# Patient Record
Sex: Female | Born: 1960 | Race: Black or African American | Hispanic: No | Marital: Single | Smoking: Current every day smoker
Health system: Southern US, Community
[De-identification: ages and names within clinical notes are randomized; demographics above are authoritative.]

## PROBLEM LIST (undated history)

## (undated) DIAGNOSIS — M5430 Sciatica, unspecified side: Secondary | ICD-10-CM

## (undated) DIAGNOSIS — R0600 Dyspnea, unspecified: Secondary | ICD-10-CM

## (undated) DIAGNOSIS — J45909 Unspecified asthma, uncomplicated: Secondary | ICD-10-CM

## (undated) DIAGNOSIS — D649 Anemia, unspecified: Secondary | ICD-10-CM

## (undated) DIAGNOSIS — R519 Headache, unspecified: Secondary | ICD-10-CM

## (undated) DIAGNOSIS — E785 Hyperlipidemia, unspecified: Secondary | ICD-10-CM

## (undated) DIAGNOSIS — K219 Gastro-esophageal reflux disease without esophagitis: Secondary | ICD-10-CM

## (undated) DIAGNOSIS — F319 Bipolar disorder, unspecified: Secondary | ICD-10-CM

## (undated) DIAGNOSIS — M5137 Other intervertebral disc degeneration, lumbosacral region: Secondary | ICD-10-CM

## (undated) DIAGNOSIS — I639 Cerebral infarction, unspecified: Secondary | ICD-10-CM

## (undated) DIAGNOSIS — J42 Unspecified chronic bronchitis: Secondary | ICD-10-CM

## (undated) DIAGNOSIS — I10 Essential (primary) hypertension: Secondary | ICD-10-CM

## (undated) DIAGNOSIS — M1990 Unspecified osteoarthritis, unspecified site: Secondary | ICD-10-CM

## (undated) DIAGNOSIS — F419 Anxiety disorder, unspecified: Secondary | ICD-10-CM

## (undated) DIAGNOSIS — F32A Depression, unspecified: Secondary | ICD-10-CM

## (undated) DIAGNOSIS — J189 Pneumonia, unspecified organism: Secondary | ICD-10-CM

## (undated) HISTORY — PX: HERNIA REPAIR: SHX51

---

## 2003-06-28 ENCOUNTER — Ambulatory Visit (HOSPITAL_COMMUNITY): Admission: RE | Admit: 2003-06-28 | Discharge: 2003-06-28 | Payer: Self-pay | Admitting: Obstetrics & Gynecology

## 2003-06-28 IMAGING — US US PELVIS COMPLETE MODIFY
1 series · 14 of 25 positions shown · non-contrast
Comparison: none

CLINICAL DATA: Family history of fibroids with heavy bleeding.
 TRANSABDOMINAL AND ENDOVAGINAL PELVIC ULTRASOUND:
 Multiple images of the uterus and adnexa were obtained using a transabdominal and endovaginal approaches. 
 The uterus has a maximal sagittal length of 10.3 cm and maximal AP width of 5.0 cm.  The uterine myometrium appears homogeneous with no areas of focal inhomogeniety to suggest the presence of focal fibroids.  
 The endometrial canal appears homogeneously echogenic with a maximal AP width of 8.0 mm and this would correlate with the patient?s presecretory phase of the cycle.  
 The right ovary was best seen transabdominally measuring 2.9 x 1.4 x 2.6 cm.  The left ovary was visualized measuring 2.8 x 1.9 x 1.8 cm and has a normal ultrasound appearance.  No cul-de-sac or periovarian fluid is seen and no separate adnexal masses are noted.
 IMPRESSION
 Normal pelvic ultrasound.

[Series 1: unknown · 0.38mm/px · 14 of 29 slices shown]
[im 1/29]
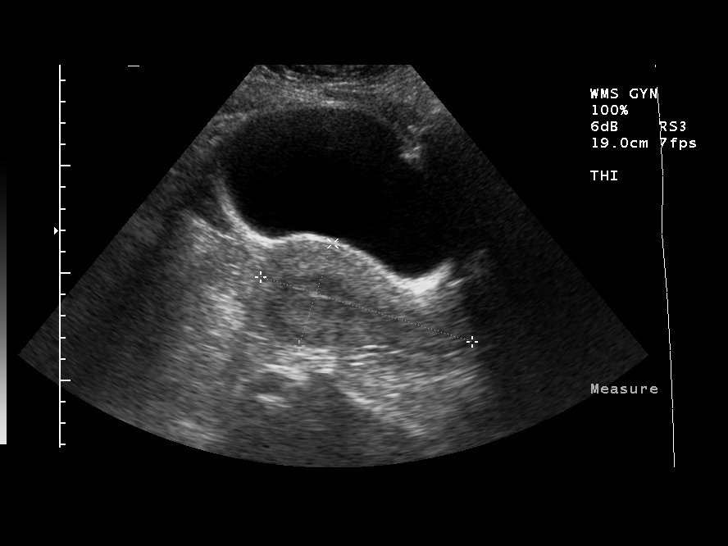
[im 3/29]
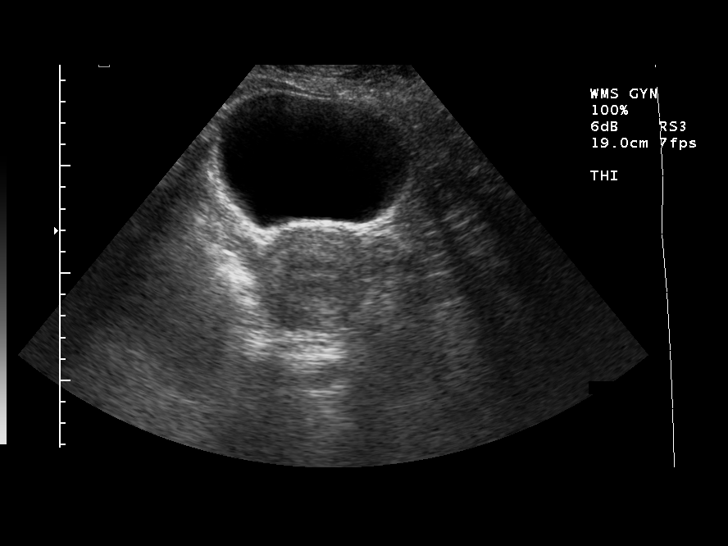
[im 5/29]
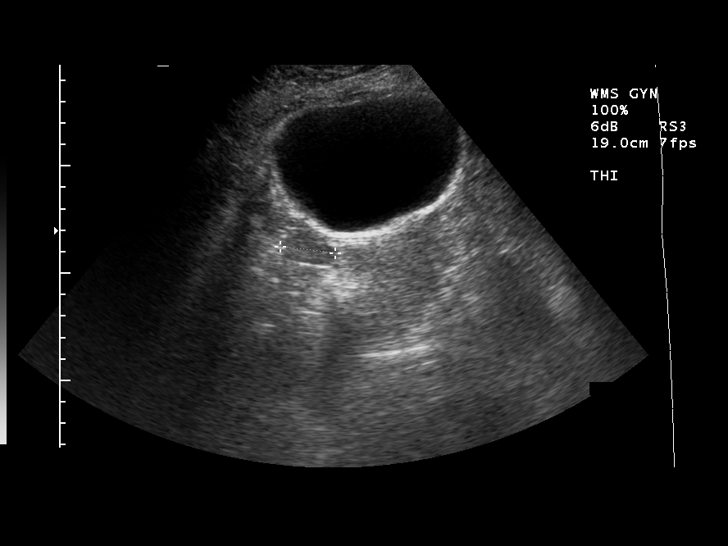
[im 8/29]
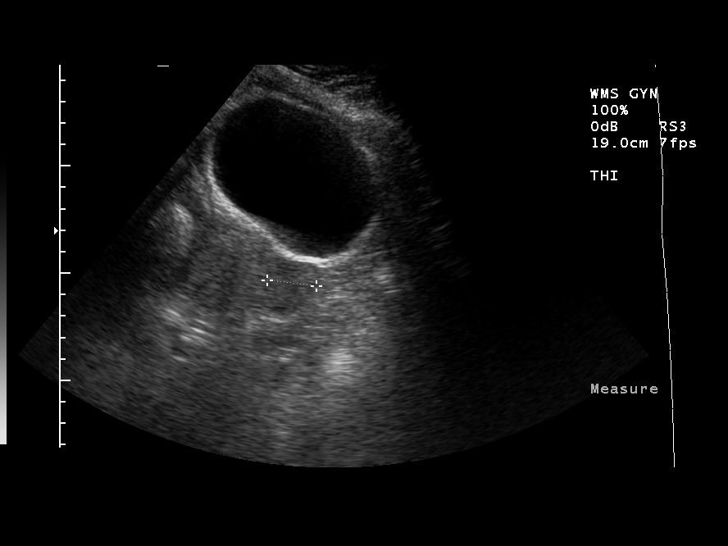
[im 10/29]
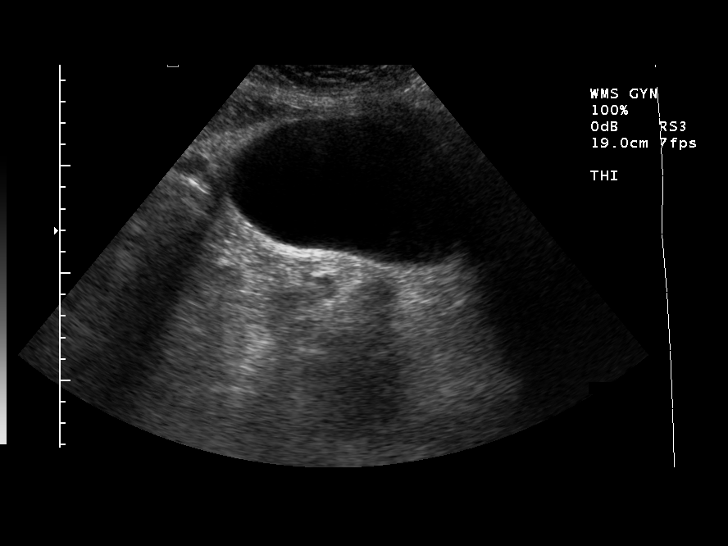
[im 11/29]
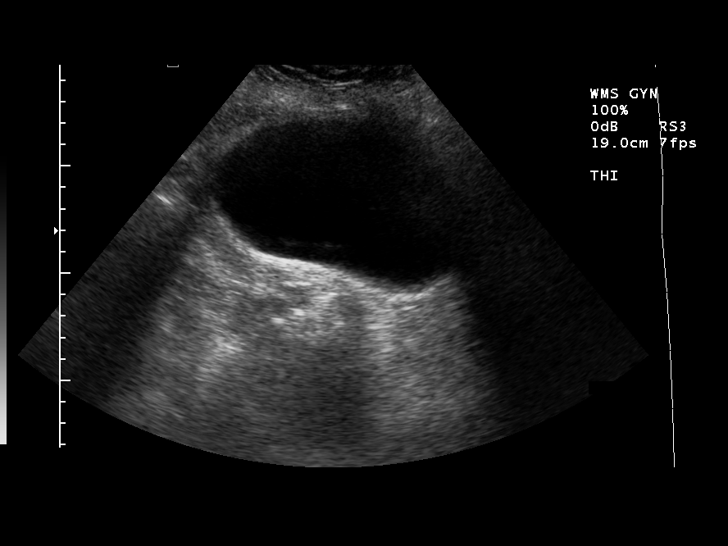
[im 13/29]
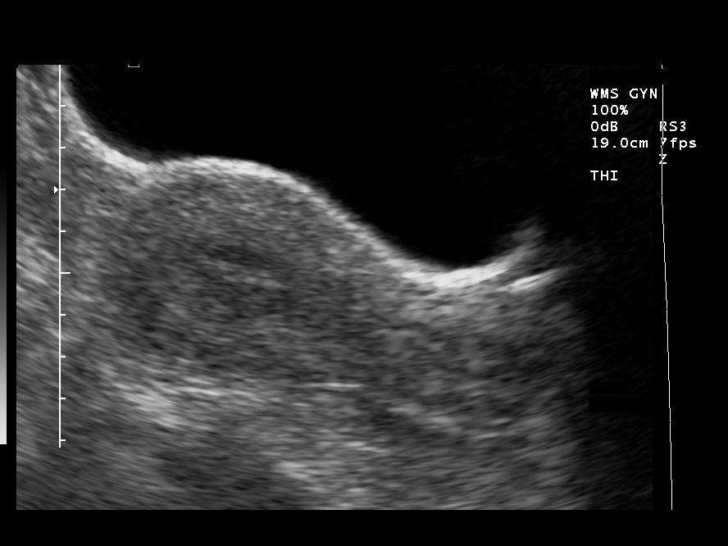
[im 16/29]
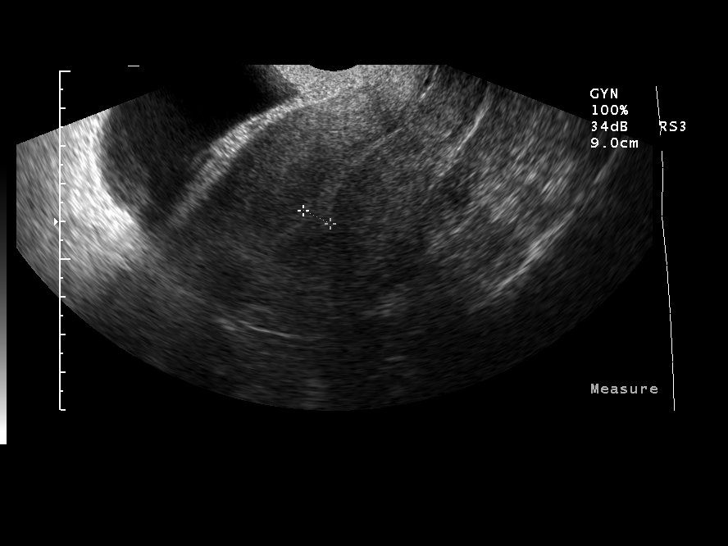
[im 18/29]
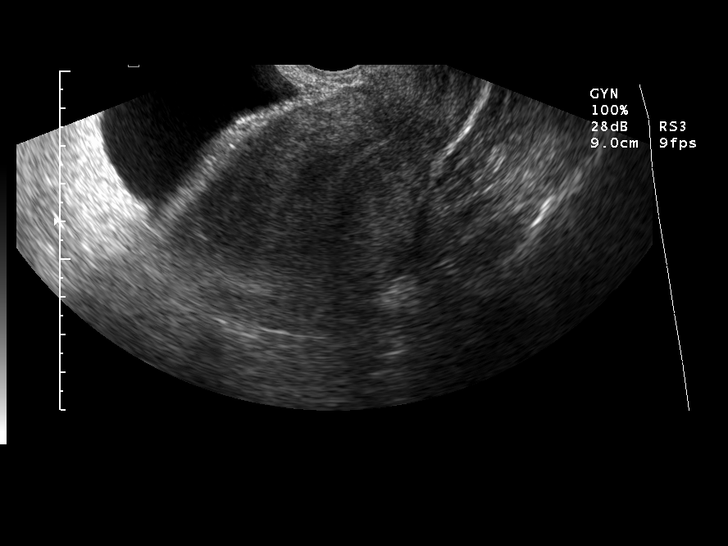
[im 19/29]
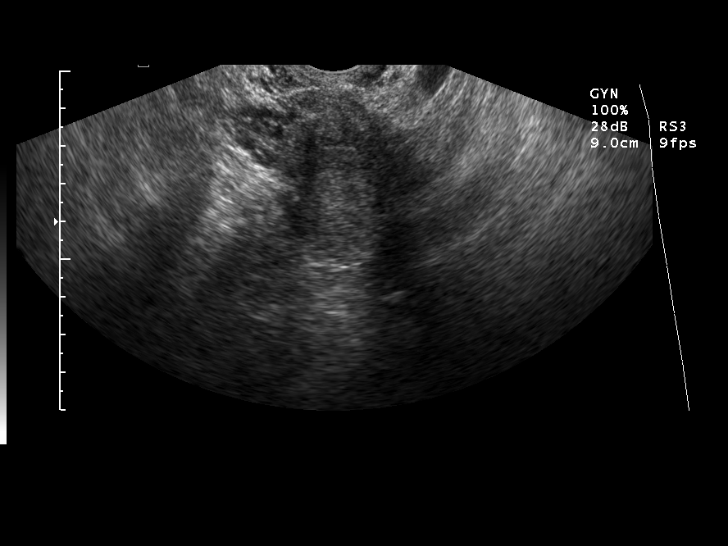
[im 22/29]
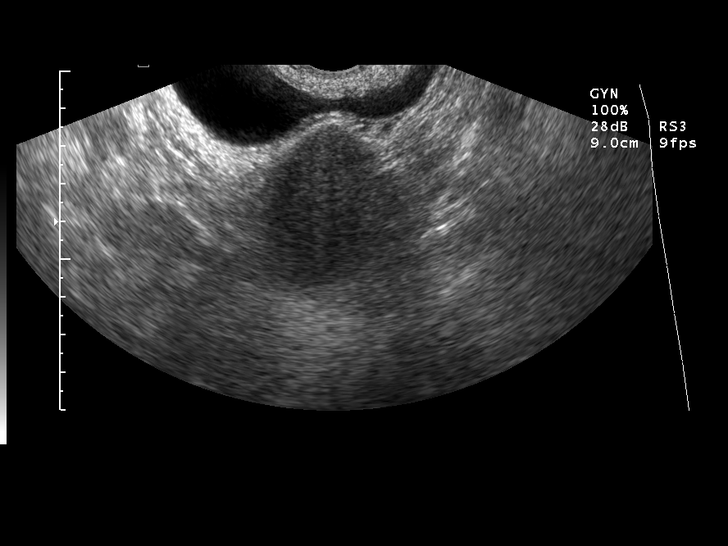
[im 24/29]
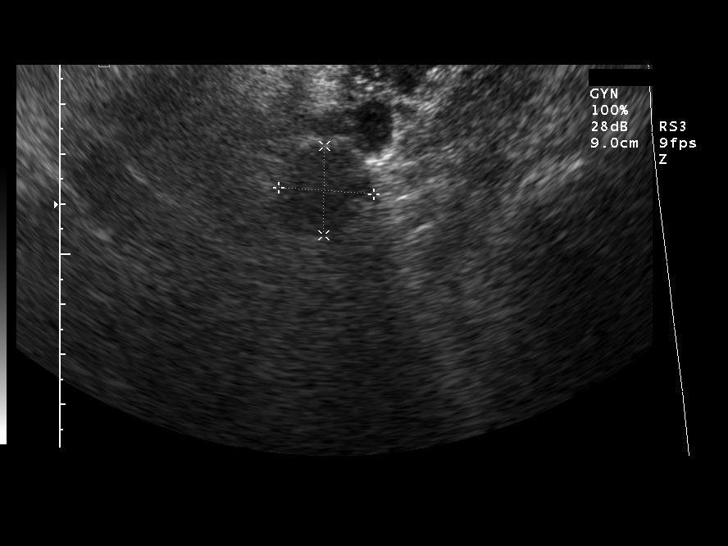
[im 26/29]
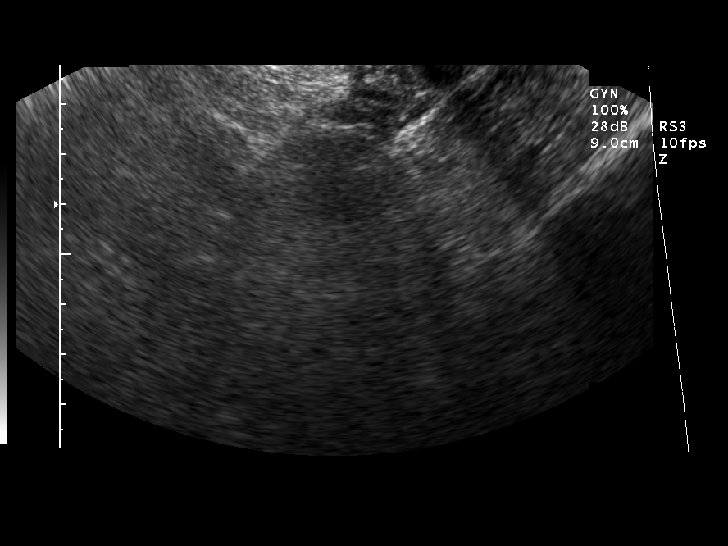
[im 29/29]
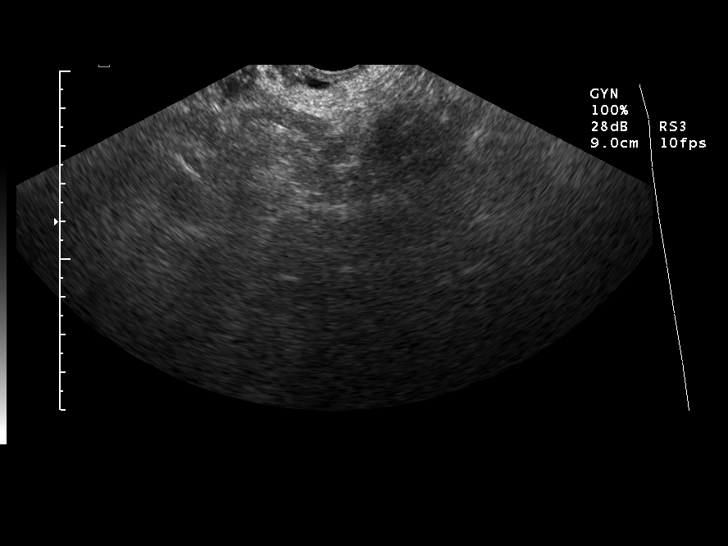

[14 of 25 positions shown; findings below may reference images not displayed]

## 2003-10-26 ENCOUNTER — Encounter (INDEPENDENT_AMBULATORY_CARE_PROVIDER_SITE_OTHER): Payer: Self-pay | Admitting: Cardiology

## 2003-10-26 ENCOUNTER — Ambulatory Visit: Admission: RE | Admit: 2003-10-26 | Discharge: 2003-10-26 | Payer: Self-pay | Admitting: Internal Medicine

## 2004-04-10 ENCOUNTER — Emergency Department (HOSPITAL_COMMUNITY): Admission: EM | Admit: 2004-04-10 | Discharge: 2004-04-10 | Payer: Self-pay | Admitting: Emergency Medicine

## 2004-04-10 IMAGING — CR DG KNEE COMPLETE 4+V*L*
4 series · 4 of 4 positions shown · non-contrast
Comparison: none

CLINICAL DATA: Knee pain.  No injury.
 LEFT KNEE, COMPLETE:
 Four views of the left knee were obtained.  No acute fracture is seen.  The joint spaces are normal.  There may be a small knee joint effusion present.

[view not recorded (1 of 4)]
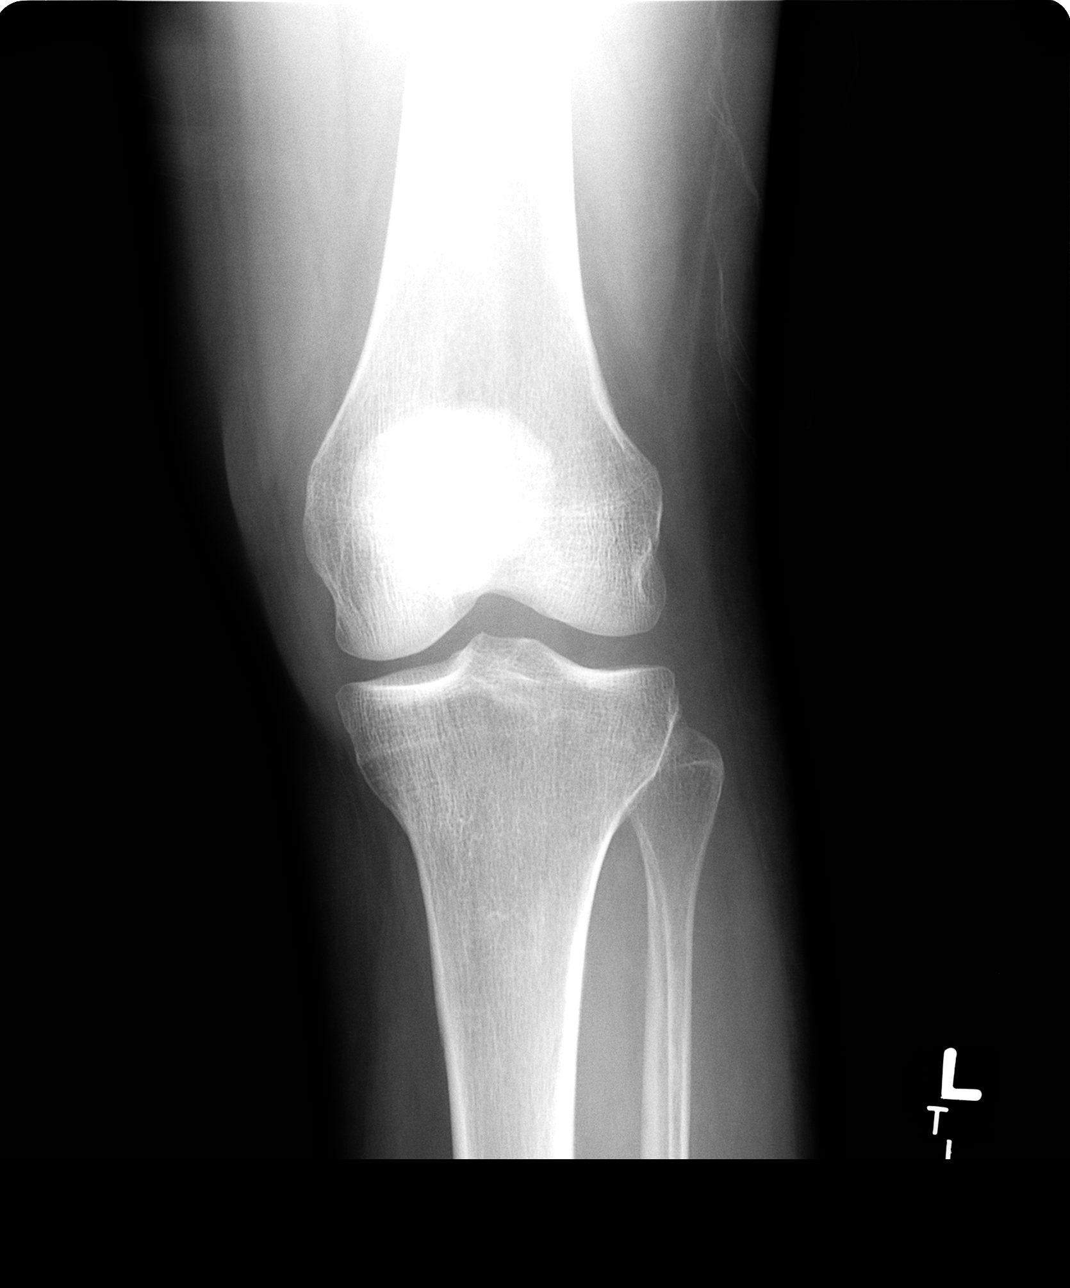

[view not recorded (2 of 4)]
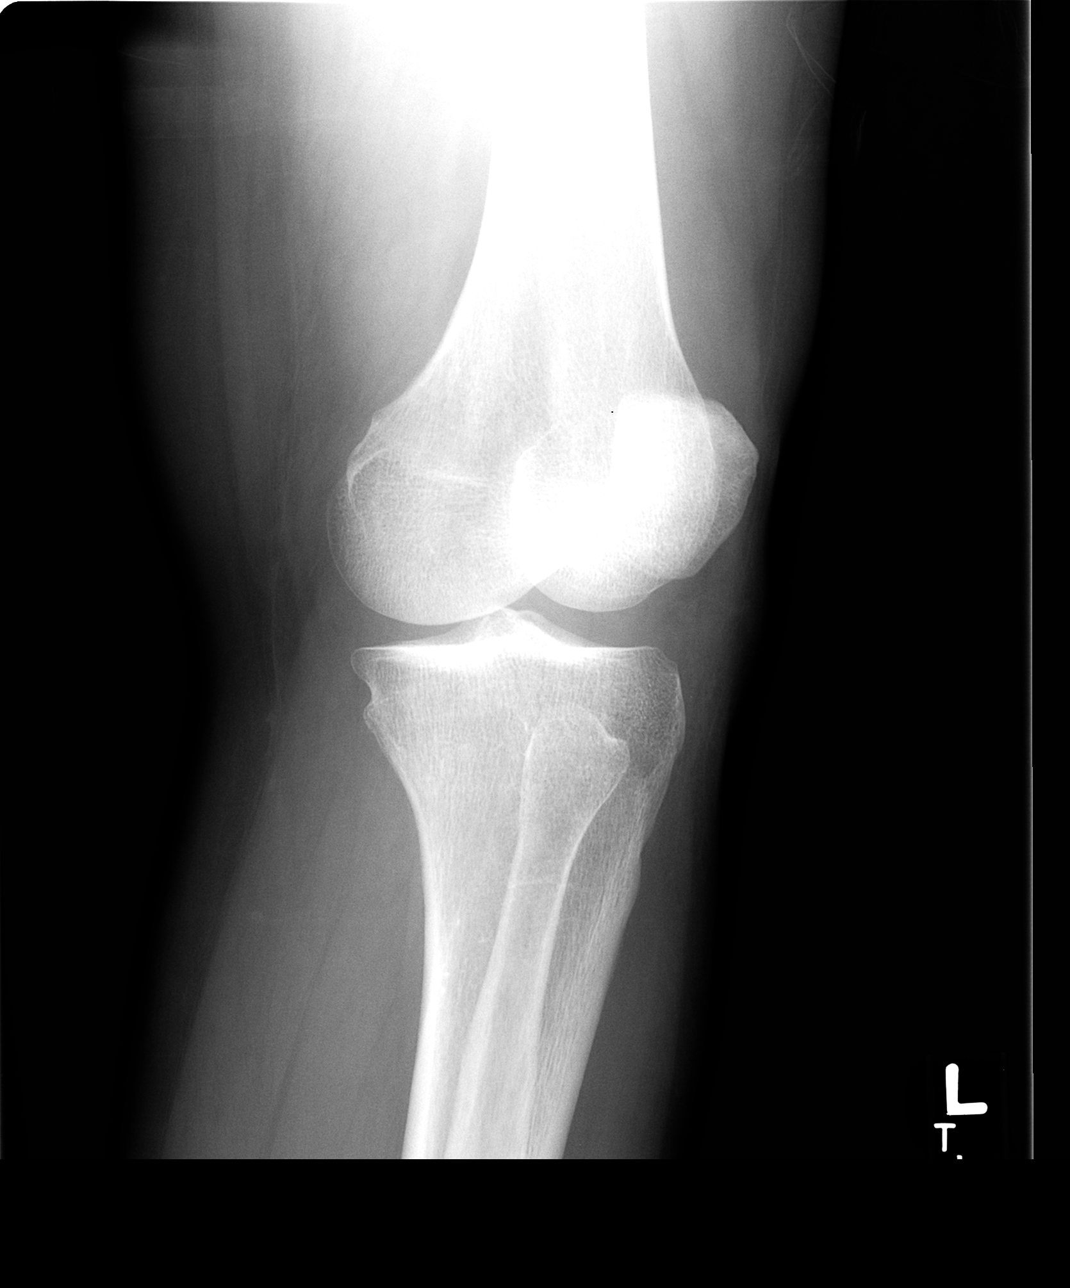

[view not recorded (3 of 4)]
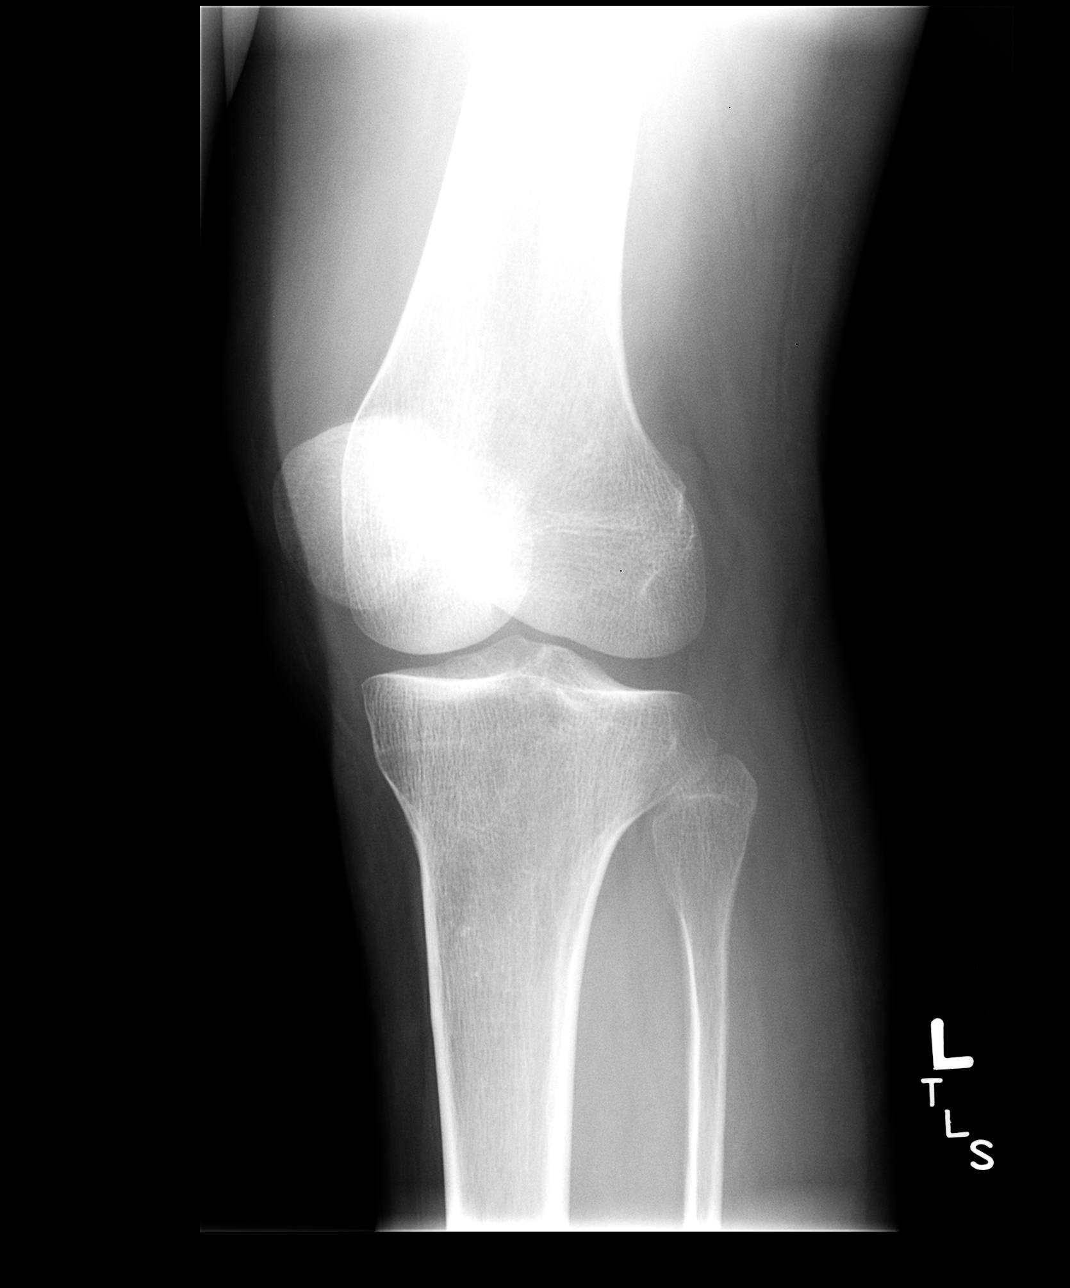

[view not recorded (4 of 4)]
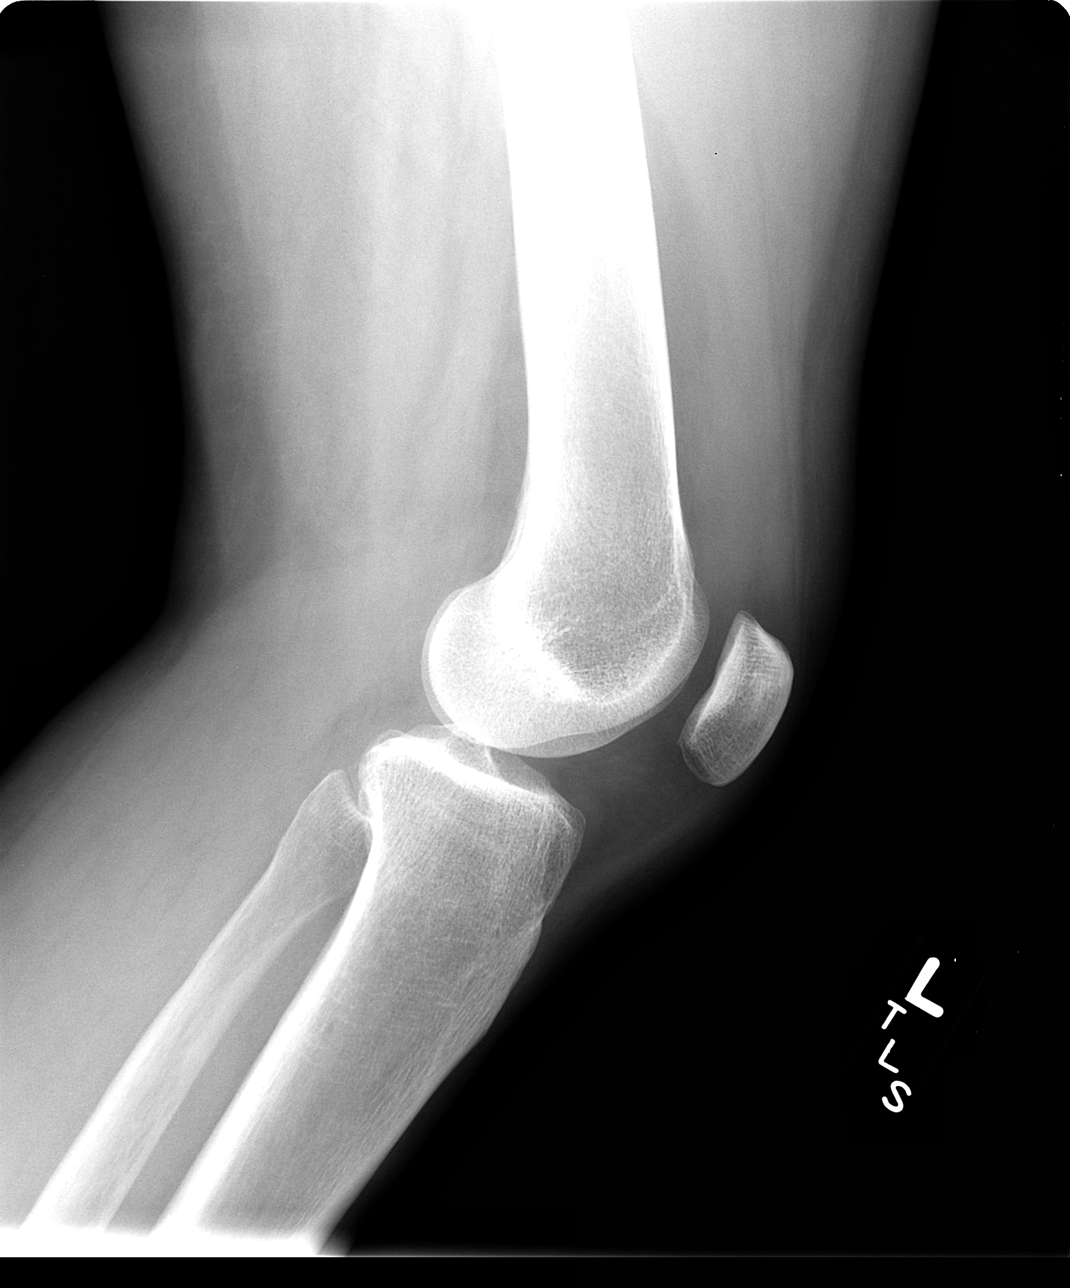

[4 of 4 positions shown; findings below may reference images not displayed]

IMPRESSION: No acute abnormality.  Question small effusion.

## 2009-10-19 ENCOUNTER — Ambulatory Visit (HOSPITAL_COMMUNITY): Admission: RE | Admit: 2009-10-19 | Discharge: 2009-10-19 | Payer: Self-pay | Admitting: Internal Medicine

## 2009-10-19 IMAGING — MG MM DIGITAL SCREENING
4 series · 4 of 4 positions shown · non-contrast
Comparison: Prior studies.

DG SCREEN MAMMOGRAM BILATERAL
Bilateral CC and MLO view(s) were taken.

DIGITAL SCREENING MAMMOGRAM WITH CAD:

[R CC]
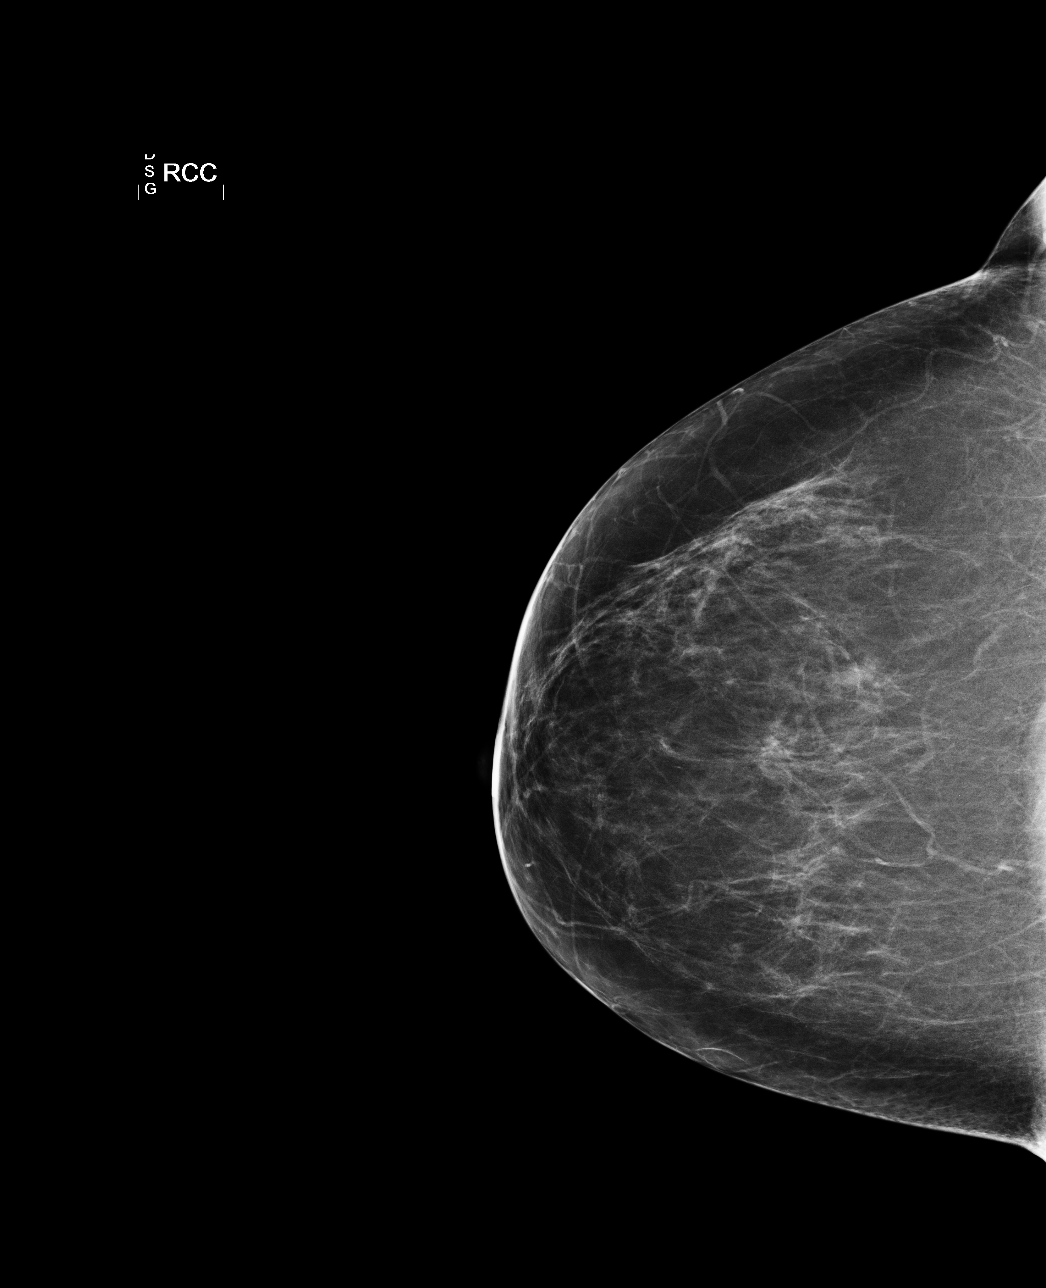

[R MLO]
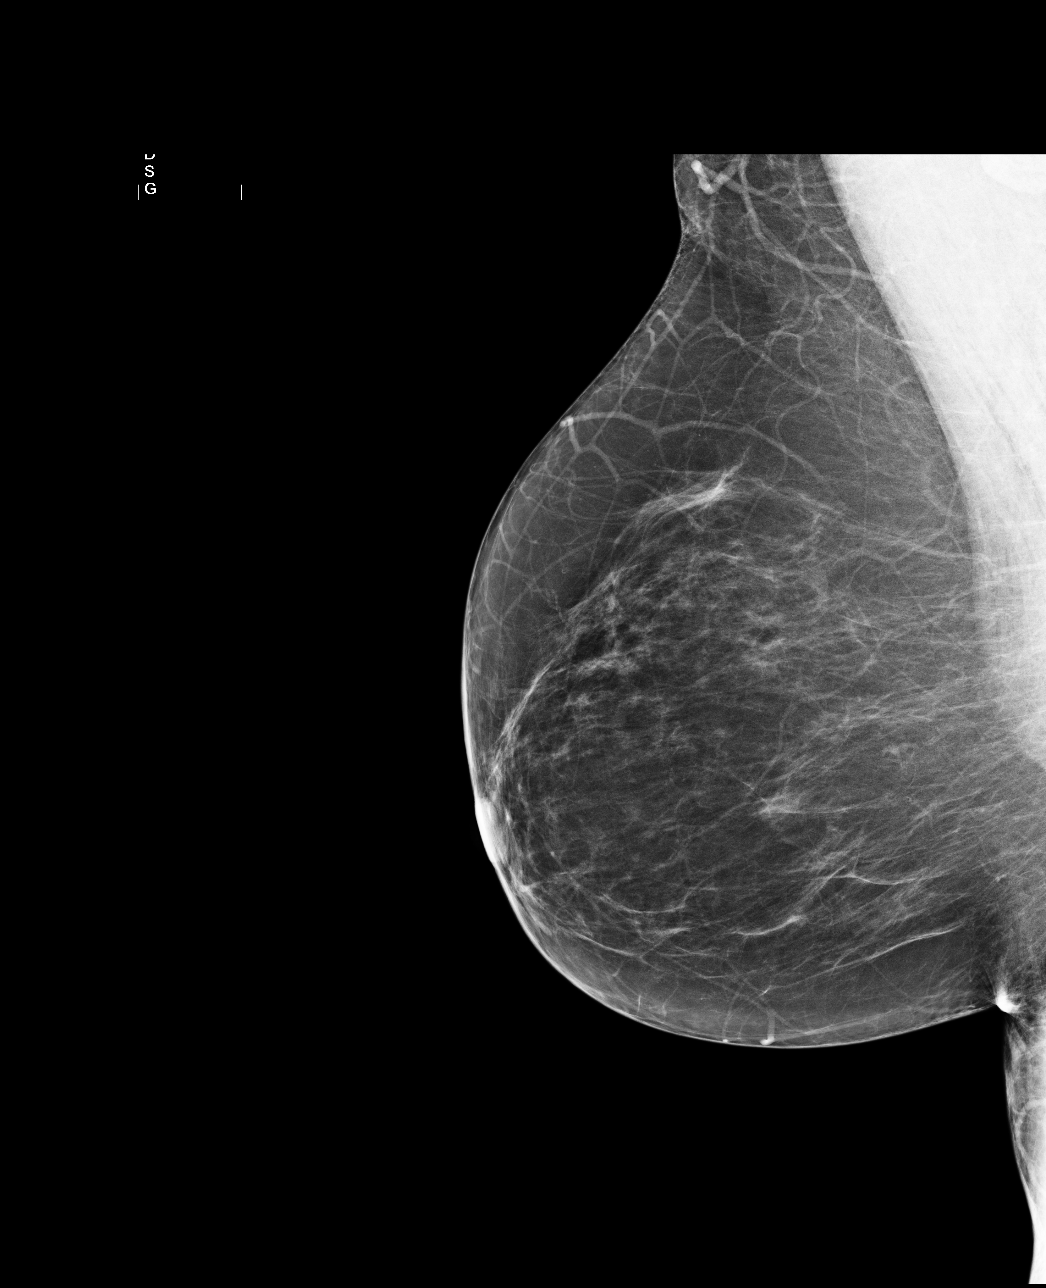

[L CC]
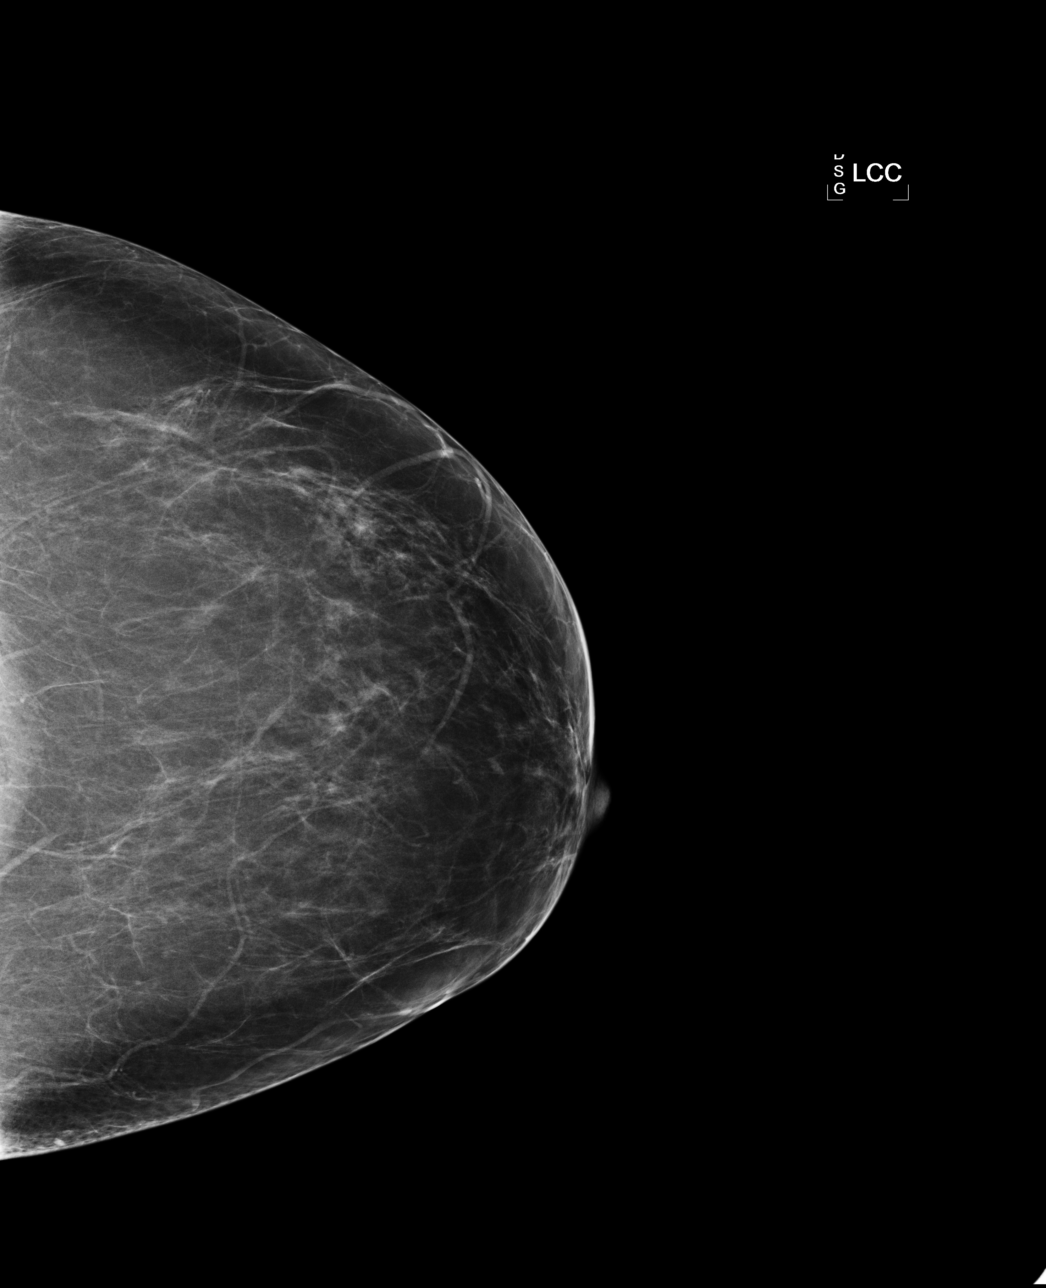

[L MLO]
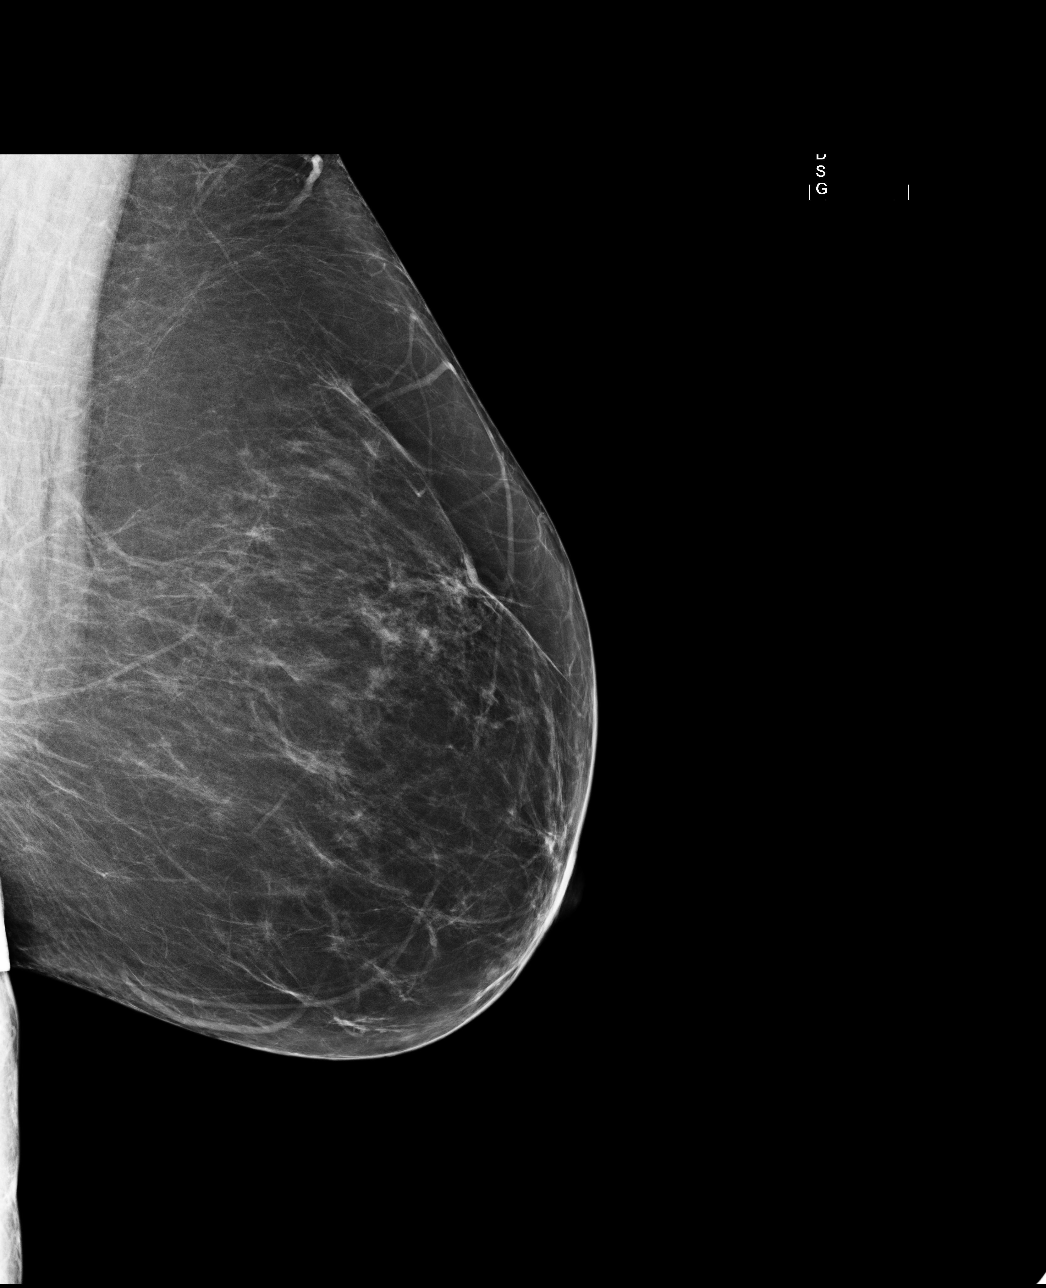

[4 of 4 positions shown; findings below may reference images not displayed]

The breast tissue is almost entirely fatty.  There is no dominant mass, architectural distortion or
calcification to suggest malignancy.

Images were processed with CAD.
IMPRESSION: No mammographic evidence of malignancy.  Suggest yearly screening mammography.

A result letter of this screening mammogram will be mailed directly to the patient.

ASSESSMENT: Negative - BI-RADS 1

Screening mammogram in 1 year.
,

## 2010-05-07 ENCOUNTER — Encounter: Payer: Self-pay | Admitting: Internal Medicine

## 2013-04-23 ENCOUNTER — Emergency Department (HOSPITAL_COMMUNITY)
Admission: EM | Admit: 2013-04-23 | Discharge: 2013-04-23 | Disposition: A | Discharge status: Home / Self Care | Payer: Self-pay | Source: Home / Self Care

## 2013-04-23 NOTE — ED Notes (Signed)
Pt called in lobby x 1. KDL EMT-P 

## 2015-09-25 ENCOUNTER — Encounter (HOSPITAL_COMMUNITY): Payer: Self-pay | Admitting: Emergency Medicine

## 2015-09-25 ENCOUNTER — Emergency Department (HOSPITAL_COMMUNITY): Payer: Medicaid Other

## 2015-09-25 ENCOUNTER — Emergency Department (HOSPITAL_COMMUNITY)
Admission: EM | Admit: 2015-09-25 | Discharge: 2015-09-25 | Disposition: A | Discharge status: Home / Self Care | Payer: Medicaid Other | Attending: Emergency Medicine | Admitting: Emergency Medicine

## 2015-09-25 DIAGNOSIS — Y92000 Kitchen of unspecified non-institutional (private) residence as  the place of occurrence of the external cause: Secondary | ICD-10-CM | POA: Insufficient documentation

## 2015-09-25 DIAGNOSIS — Y999 Unspecified external cause status: Secondary | ICD-10-CM | POA: Insufficient documentation

## 2015-09-25 DIAGNOSIS — W458XXA Other foreign body or object entering through skin, initial encounter: Secondary | ICD-10-CM | POA: Diagnosis not present

## 2015-09-25 DIAGNOSIS — E785 Hyperlipidemia, unspecified: Secondary | ICD-10-CM | POA: Diagnosis not present

## 2015-09-25 DIAGNOSIS — I10 Essential (primary) hypertension: Secondary | ICD-10-CM | POA: Insufficient documentation

## 2015-09-25 DIAGNOSIS — S99922A Unspecified injury of left foot, initial encounter: Secondary | ICD-10-CM

## 2015-09-25 DIAGNOSIS — S90852A Superficial foreign body, left foot, initial encounter: Secondary | ICD-10-CM | POA: Insufficient documentation

## 2015-09-25 DIAGNOSIS — Y939 Activity, unspecified: Secondary | ICD-10-CM | POA: Insufficient documentation

## 2015-09-25 HISTORY — DX: Anxiety disorder, unspecified: F41.9

## 2015-09-25 HISTORY — DX: Hyperlipidemia, unspecified: E78.5

## 2015-09-25 HISTORY — DX: Unspecified chronic bronchitis: J42

## 2015-09-25 HISTORY — DX: Essential (primary) hypertension: I10

## 2015-09-25 IMAGING — CR DG FOOT COMPLETE 3+V*L*
3 series · 3 of 3 positions shown · non-contrast
Comparison: None.

CLINICAL DATA: Fishing hook in the left foot.

EXAM:
LEFT FOOT - COMPLETE 3+ VIEW

[x foot lat left (1 of 2)]
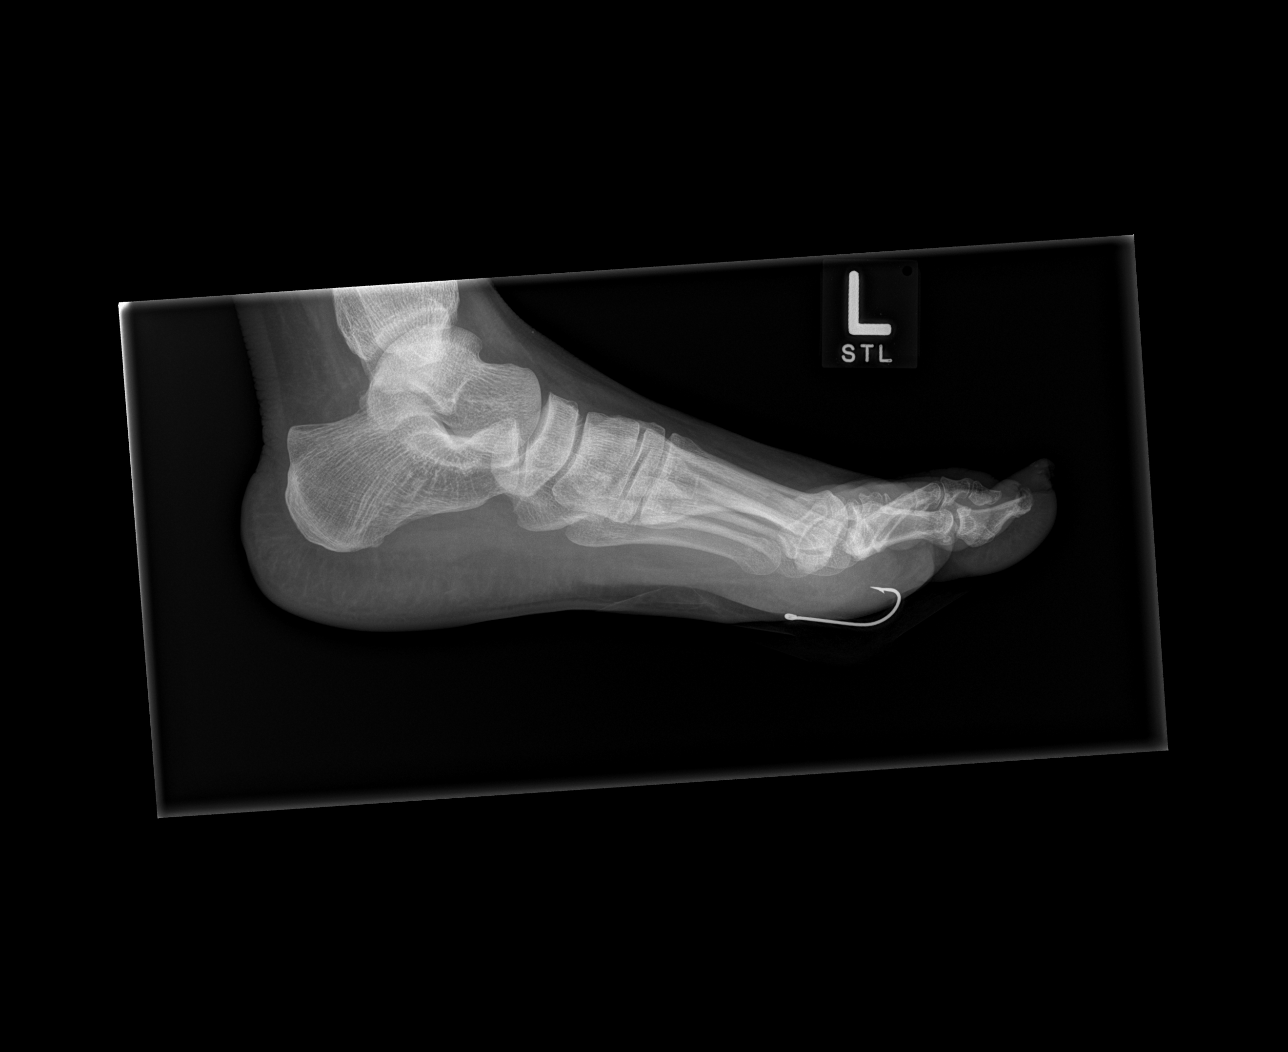

[x foot lat left (2 of 2)]
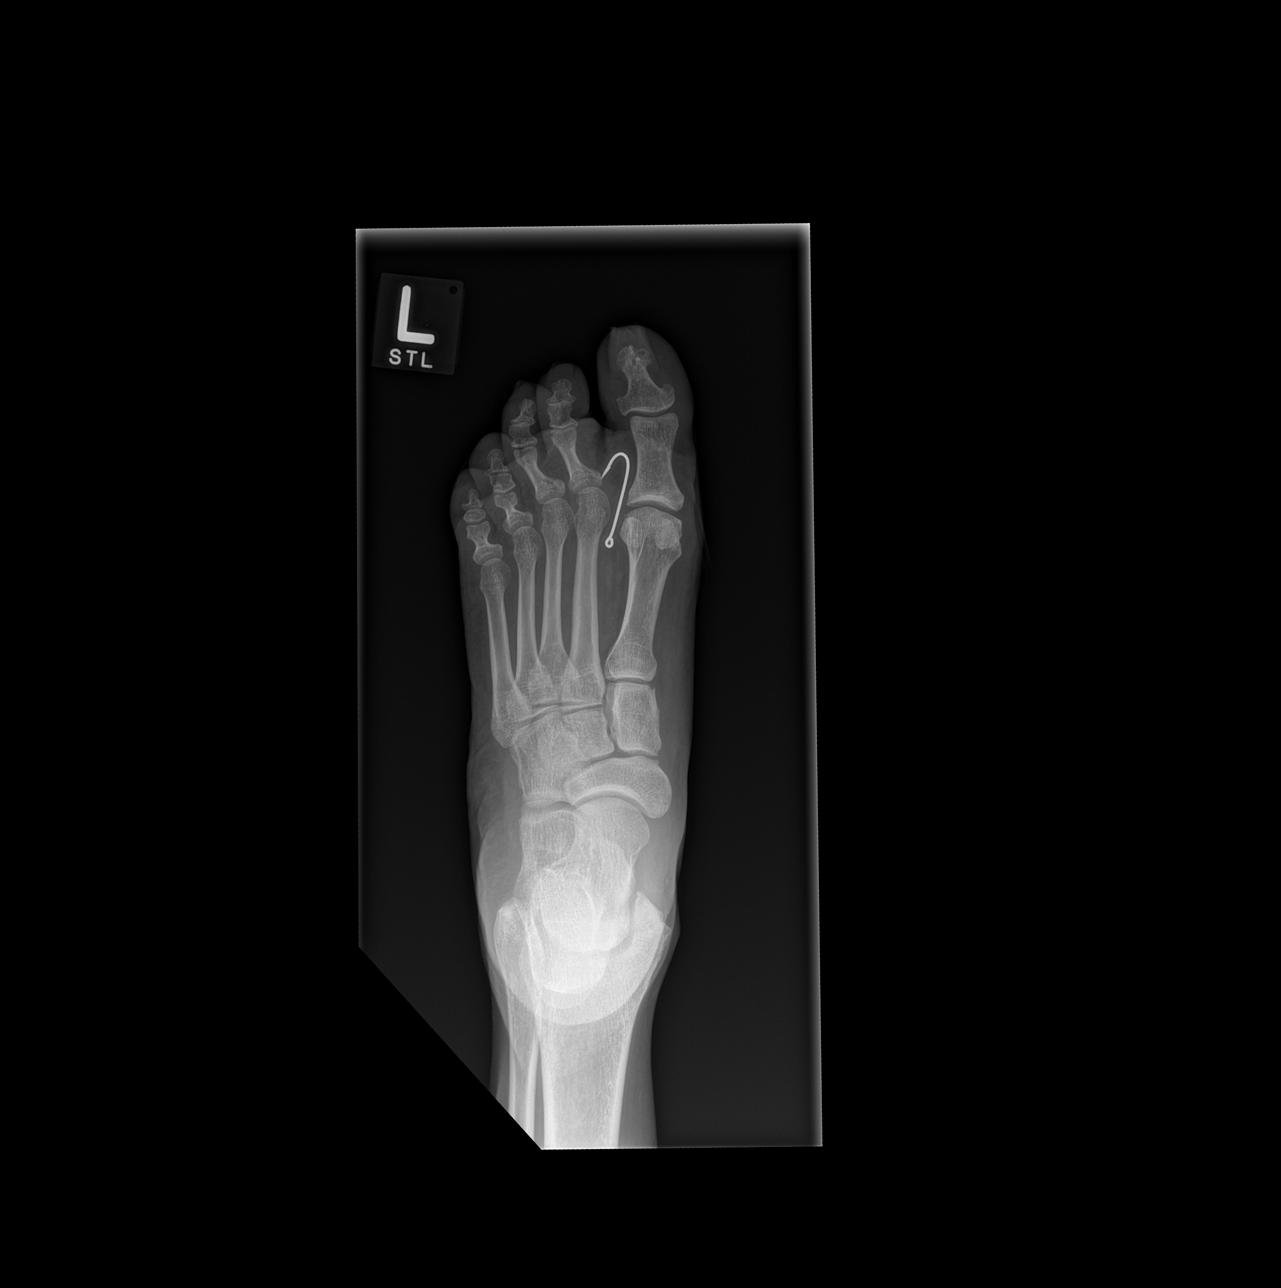

[x foot obl left]
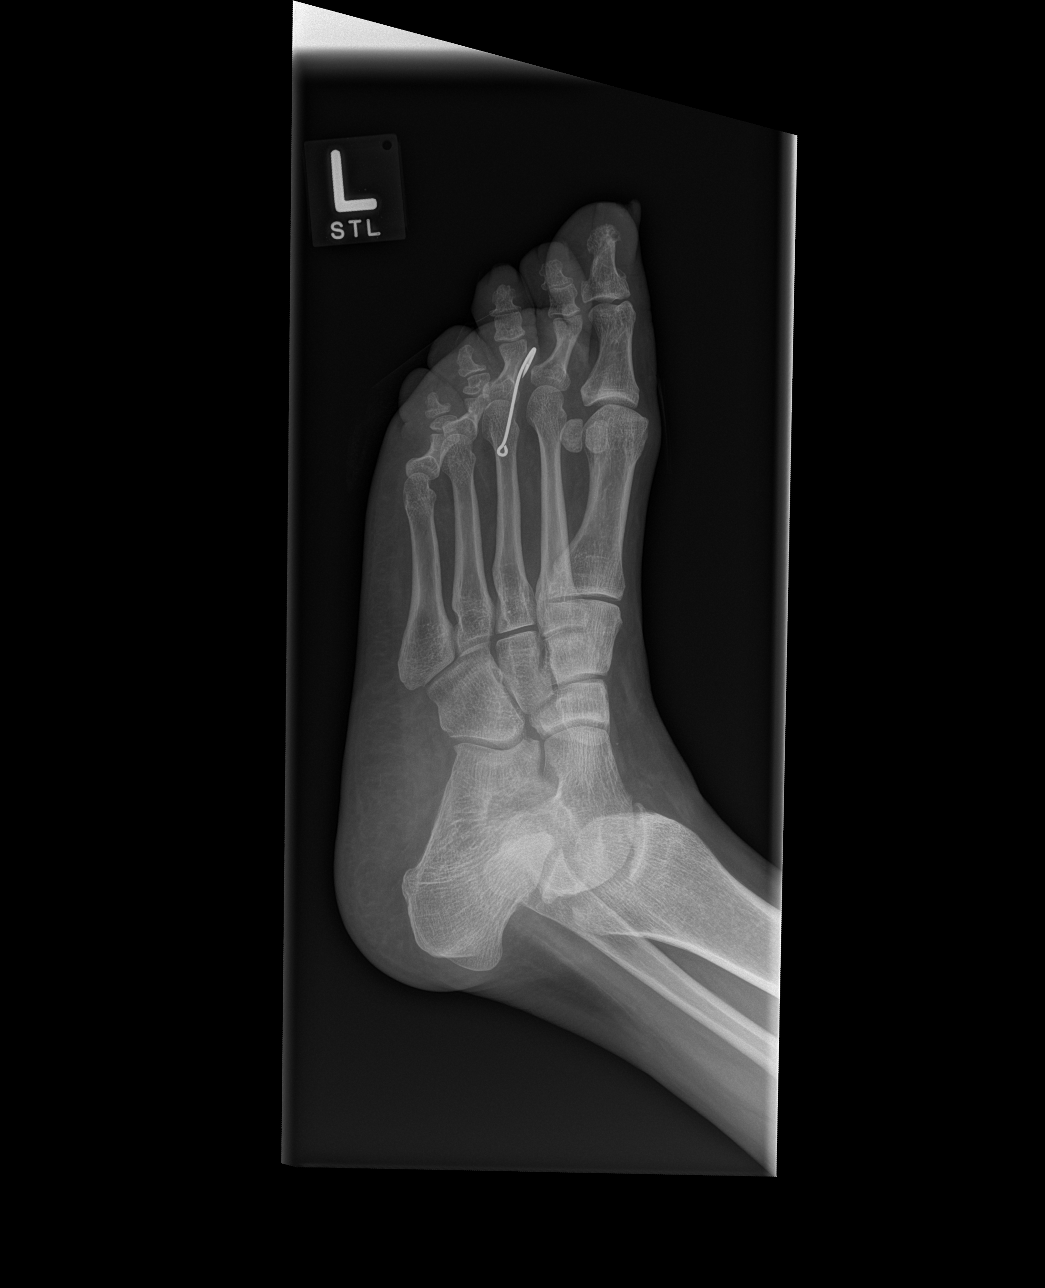

[3 of 3 positions shown; findings below may reference images not displayed]

FINDINGS: No visible fracture of the fishing hook embedded in the soft tissues
of the ball of the patient's foot between the first and second rays.
It appears that about 11 mm of the hook is imbedded into the
tissues. Single retention RTOYOTA on the conventional style fish hook.
No fracture of the hook is identified. The hook is fully imbedded in
the soft tissues and well away from the bones.
IMPRESSION: 1. Conventional and simple style fish hook embedded in the soft
tissues of the ball of the foot, with about 11 mm imbedded in the
soft tissues. The tissue is well away from the bony structures, and
has a single retention RTOYOTA.

## 2015-09-25 MED ORDER — HYDROCODONE-ACETAMINOPHEN 5-325 MG PO TABS
1.0000 | ORAL_TABLET | Freq: Once | ORAL | Status: AC
  Administered 2015-09-25: 1 via ORAL
  Filled 2015-09-25: qty 1

## 2015-09-25 MED ORDER — LIDOCAINE-EPINEPHRINE (PF) 1 %-1:200000 IJ SOLN
10.0000 mL | Freq: Once | INTRAMUSCULAR | Status: DC
  Filled 2015-09-25: qty 30

## 2015-09-25 MED ORDER — NAPROXEN 500 MG PO TABS: 500.0000 mg | ORAL_TABLET | Freq: Two times a day (BID) | ORAL | Status: DC | PRN

## 2015-09-25 MED ORDER — LIDOCAINE-EPINEPHRINE (PF) 1 %-1:200000 IJ SOLN
INTRAMUSCULAR | Status: AC
  Administered 2015-09-25: 30 mL
  Filled 2015-09-25: qty 30

## 2015-09-25 MED ORDER — DOXYCYCLINE HYCLATE 100 MG PO CAPS: 100.0000 mg | ORAL_CAPSULE | Freq: Two times a day (BID) | ORAL | Status: DC

## 2015-09-25 MED ORDER — HYDROCODONE-ACETAMINOPHEN 5-325 MG PO TABS: 1.0000 | ORAL_TABLET | Freq: Four times a day (QID) | ORAL | Status: DC | PRN

## 2015-09-25 MED ORDER — TETANUS-DIPHTH-ACELL PERTUSSIS 5-2.5-18.5 LF-MCG/0.5 IM SUSP
0.5000 mL | Freq: Once | INTRAMUSCULAR | Status: AC
  Administered 2015-09-25: 0.5 mL via INTRAMUSCULAR
  Filled 2015-09-25: qty 0.5

## 2015-09-25 NOTE — ED Notes (Signed)
Patient transported to X-ray 

## 2015-09-25 NOTE — Discharge Instructions (Signed)
Keep wound clean with mild soap and water. Keep area covered with a topical antibiotic ointment and bandage, keep bandage dry, and do not submerge in water for 24 hours. Ice and elevate for additional pain relief and swelling. Alternate between naprosyn and norco for additional pain relief but don't drive or operate machinery while taking norco. TAKE ANTIBIOTIC AS DIRECTED! Follow up with your primary care doctor or the [facility] in approximately 1-2 days for wound recheck. Monitor area for signs of infection to include, but not limited to: increasing pain, spreading redness, drainage/pus, worsening swelling, or fevers. Return to emergency department for emergent changing or worsening symptoms.

## 2015-09-25 NOTE — ED Notes (Signed)
Bed: [id number] Expected date:  Expected time:  Means of arrival:  Comments: 55 yo fishing hook in foot

## 2015-09-25 NOTE — ED Provider Notes (Signed)
CSN: [id number]     Arrival date & time 09/25/15  1857 History  By signing my name below, I, Katrina Schmidt, attest that this documentation has been prepared under the direction and in the presence of [address], [employer]. Electronically Signed: Phillis Schmidt, ED Scribe. 09/25/2015. 7:11 PM.   Chief Complaint  Patient presents with  . Foreign Body in Skin   Patient is a 55 y.o. female presenting with foot injury. The history is provided by the patient. No language interpreter was used.  Foot Injury Location:  Toe Time since incident:  30 minutes Injury: yes   Mechanism of injury comment:  Stepped on metallic object Toe location:  L big toe (below L big toe, on the foot) Pain details:    Quality:  Throbbing   Radiates to:  Does not radiate   Severity:  Mild   Onset quality:  Sudden   Duration: just PTA.   Timing:  Constant   Progression:  Worsening Chronicity:  New Dislocation: no   Foreign body present:  Metal Tetanus status:  Out of date Relieved by:  Ice Exacerbated by: palpation. Ineffective treatments:  None tried Associated symptoms: no decreased ROM, no muscle weakness, no numbness, no swelling and no tingling     HPI Comments: Katrina Schmidt is a 55 y.o. female with a PMHx of HTN, HLD, sciatica, and chronic bronchitis, who presents to the Emergency Department complaining of a foreign body to the left foot near the L great toe onset ~20-30 minutes. Pt reports pain to this area is 8/10, constant, throbbing, nonradiating pain, worse with palpation of the metal object, and improves with ice on the area. Pt states that she stepped on a sharp metal object in the kitchen, which is now stuck in her foot. She believes that it may be a hair needle, but it might be a fishing hook, although she states no one in the home fishes. She states that she is unable to get it out. Bleeding has been controlled.  She denies falling/head inj/LOC, CP, SOB, abd pain, N/V/D/C, hematuria, dysuria,  myalgias, numbness, tingling, weakness, loss of ROM of the digits, or swelling. Pt is not UTD on her Tdap, she thinks the last time she had it was 28yrs ago. She denies allergies to medications. She denies hx of immunocompromising conditions.   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 She endorses that she takes BP meds regularly and took them today. She also denies any lightheadedness, vision changes, headaches, or any other symptoms.     Past Medical History  Diagnosis Date  . Hypertension   . Anxiety   . Chronic bronchitis (HCC)   . Hyperlipidemia    No past surgical history on file. History reviewed. No pertinent family history. Social History  Substance Use Topics  . Smoking status: None  . Smokeless tobacco: None  . Alcohol Use: None   OB History    No data available     Review of Systems  HENT: Negative for facial swelling (no head inj).   Eyes: Negative for visual disturbance.  Respiratory: Negative for shortness of breath.   Cardiovascular: Negative for chest pain.  Gastrointestinal: Negative for nausea, vomiting, abdominal pain, diarrhea and constipation.  Genitourinary: Negative for dysuria and hematuria.  Musculoskeletal: Positive for arthralgias. Negative for myalgias and joint swelling.  Skin: Positive for wound. Negative for rash.  Allergic/Immunologic: Negative for immunocompromised state.  Neurological: Negative  for syncope, weakness, light-headedness, numbness and headaches.  Hematological: Does not bruise/bleed easily.  Psychiatric/Behavioral: Negative for confusion.   10 Systems reviewed and all are negative for acute change except as noted in the HPI.  Allergies  Review of patient's allergies indicates no known allergies.  Home Medications   Prior to Admission medications   Not on File    BP 172/90 mmHg  Pulse 92  Resp 18  Ht 5\' 5"  (1.651 m)  Wt 76.114 kg  BMI 27.92  kg/m2  SpO2 96%  Physical Exam  Constitutional: She is oriented to person, place, and time. Vital signs are normal. She appears well-developed and well-nourished.  Non-toxic appearance. No distress.  Mildly hypertensive during exam likely from anxiety/pain, appears anxious but in NAD, nontoxic  HENT:  Head: Normocephalic and atraumatic.  Mouth/Throat: Mucous membranes are normal.  Eyes: Conjunctivae and EOM are normal. Right eye exhibits no discharge. Left eye exhibits no discharge.  Neck: Normal range of motion. Neck supple.  Cardiovascular: Normal rate and intact distal pulses.   Pulmonary/Chest: Effort normal. No respiratory distress.  Abdominal: Normal appearance. She exhibits no distension.  Musculoskeletal: Normal range of motion.       Left foot: There is tenderness and laceration (puncture wound from metallic object). There is normal range of motion, no bony tenderness, no swelling, normal capillary refill and no deformity.  Left foot with metallic FB puncturing the plantar aspect of the ball of the foot, with mild tenderness to the area but no focal bony TTP to the remainder of the foot, no swelling or deformity, no ongoing bleeding, no bruising or warmth, wiggles all digits without difficulty, strength and sensation grossly intact, distal pulses intact, with soft compartments.  SEE PICTURE BELOW  Neurological: She is alert and oriented to person, place, and time. She has normal strength. No sensory deficit.  Skin: Skin is warm and dry. No rash noted.  Metallic FB in left foot as noted above in picture below  Psychiatric: Her behavior is normal. Her mood appears anxious.  Nursing note and vitals reviewed.    ED Course  .Foreign Body Removal Date/Time: 09/25/2015 8:35 PM Performed by: Allen DerryAMPRUBI-SOMS, Kashvi Prevette Authorized by: Allen DerryAMPRUBI-SOMS, Seymone Forlenza Consent: Verbal consent obtained. Risks and benefits: risks, benefits and alternatives were discussed Consent given by:  patient Patient understanding: patient states understanding of the procedure being performed Patient consent: the patient's understanding of the procedure matches consent given Patient identity confirmed: verbally with patient Time out: Immediately prior to procedure a "time out" was called to verify the correct patient, procedure, equipment, support staff and site/side marked as required. Body area: skin General location: lower extremity Location details: left foot Anesthesia: local infiltration Local anesthetic: lidocaine 1% with epinephrine Anesthetic total: 6 ml Patient sedated: no Patient restrained: no Patient cooperative: yes Localization method: xray. Removal mechanism: alligator forceps (and large wire cutter pliers) Dressing: antibiotic ointment and dressing applied Tendon involvement: none Depth: deep Complexity: complex 1 objects recovered. Objects recovered: fishing hook Post-procedure assessment: foreign body removed Patient tolerance: Patient tolerated the procedure well with no immediate complications   (including critical care time) DIAGNOSTIC STUDIES: Oxygen Saturation is 96% on RA, normal by my interpretation.    COORDINATION OF CARE: 7:12 PM-Discussed treatment plan which includes x-ray with pt at bedside and pt agreed to plan.    Labs Review Labs Reviewed - No data to display  Imaging Review Dg Foot Complete Left  09/25/2015  CLINICAL DATA:  Fishing hook in the left foot. EXAM:  LEFT FOOT - COMPLETE 3+ VIEW COMPARISON:  None. FINDINGS: No visible fracture of the fishing hook embedded in the soft tissues of the ball of the patient's foot between the first and second rays. It appears that about 11 mm of the hook is imbedded into the tissues. Single retention barb on the conventional style fish hook. No fracture of the hook is identified. The hook is fully imbedded in the soft tissues and well away from the bones. IMPRESSION: 1. Conventional and simple style  fish hook embedded in the soft tissues of the ball of the foot, with about 11 mm imbedded in the soft tissues. The tissue is well away from the bony structures, and has a single retention barb. Electronically Signed   By: Gaylyn Rong M.D.   On: 09/25/2015 19:45   I have personally reviewed and evaluated these images and lab results as part of my medical decision-making.   EKG Interpretation None      MDM   Final diagnoses:  Fishing hook foreign body, initial encounter  Foot injury, left, initial encounter    55 y.o. female here with FB in her L foot, pt unsure what it is, could be fishing hook vs a hair hook?Marland Kitchen No bleeding. NVI with soft compartments, wiggles all digits. Will update tetanus and obtain xray to evaluate the metallic object further to see how we will need to proceed to remove it. Will likely need to numb the area up prior to removal. Will give pain meds. Of note, pt hypertensive but appears very anxious and is asymptomatic for her HTN, doubt need for further work up as this likely represents pain/anxiety response. Will reassess shortly  7:44 PM Xray images reviewed, appears to be a fishing hook which has a barb prong on the end, will need to push it through the skin and cut the end off in order to remove this object. Will set up for this procedure, and proceed when we find the required tools. Will reassess shortly  8:36 PM Fishing hook removed after numbing foot up adequately and pushing it through the other end, cutting the end off with wire cutter pliers, and then removing it entirely. Irrigated wound as much as possible. Will start on abx, wound dressed and post op shoe given for comfort. Will send home with pain meds. Discussed RICE therapy. F/up with PCP in 1-2 days for recheck of wound and ongoing management of wound. I explained the diagnosis and have given explicit precautions to return to the ER including for any other new or worsening symptoms. The patient  understands and accepts the medical plan as it's been dictated and I have answered their questions. Discharge instructions concerning home care and prescriptions have been given. The patient is STABLE and is discharged to home in good condition.   I personally performed the services described in this documentation, which was scribed in my presence. The recorded information has been reviewed and is accurate.    BP 172/90 mmHg  Pulse 92  Resp 18  Ht 5\' 5"  (1.651 m)  Wt 76.114 kg  BMI 27.92 kg/m2  SpO2 96%  Meds ordered this encounter  Medications  . lidocaine-EPINEPHrine (XYLOCAINE-EPINEPHrine) 1 %-1:200000 (PF) injection 10 mL    Sig:   . Tdap (BOOSTRIX) injection 0.5 mL    Sig:   . HYDROcodone-acetaminophen (NORCO/VICODIN) 5-325 MG per tablet 1 tablet    Sig:   . lidocaine-EPINEPHrine (XYLOCAINE-EPINEPHrine) 1 %-1:200000 (PF) injection    Sig:  Duwaine Maxin   : cabinet override  . doxycycline (VIBRAMYCIN) 100 MG capsule    Sig: Take 1 capsule (100 mg total) by mouth 2 (two) times daily. One po bid x 7 days    Dispense:  14 capsule    Refill:  0    Order Specific Question:  Supervising Provider    Answer:  MILLER, BRIAN [[id number]]  . HYDROcodone-acetaminophen (NORCO) 5-325 MG tablet    Sig: Take 1 tablet by mouth every 6 (six) hours as needed for severe pain.    Dispense:  10 tablet    Refill:  0    Order Specific Question:  Supervising Provider    Answer:  Hyacinth Meeker, BRIAN [[id number]]  . naproxen (NAPROSYN) 500 MG tablet    Sig: Take 1 tablet (500 mg total) by mouth 2 (two) times daily as needed for mild pain, moderate pain or headache (TAKE WITH MEALS.).    Dispense:  20 tablet    Refill:  0    Order Specific Question:  Supervising Provider    Answer:  Eber Hong [[id number]]     Joseeduardo Brix Camprubi-Soms, PA-C 09/25/15 2037  Loren Racer, MD 09/25/15 2321

## 2015-09-25 NOTE — ED Notes (Signed)
Mercedes informed of pt's b/p.  No new orders received.

## 2015-09-25 NOTE — ED Notes (Signed)
Pt states that she has a fish hook stuck in her L large toe. States she stepped on it in the house. Bleeding controlled. Alert and oriented.

## 2016-05-29 ENCOUNTER — Emergency Department (HOSPITAL_COMMUNITY)
Admission: EM | Admit: 2016-05-29 | Discharge: 2016-05-29 | Disposition: A | Discharge status: Home / Self Care | Payer: Medicaid Other | Attending: Emergency Medicine | Admitting: Emergency Medicine

## 2016-05-29 ENCOUNTER — Emergency Department (HOSPITAL_COMMUNITY): Payer: Medicaid Other

## 2016-05-29 ENCOUNTER — Encounter (HOSPITAL_COMMUNITY): Payer: Self-pay | Admitting: Emergency Medicine

## 2016-05-29 DIAGNOSIS — I10 Essential (primary) hypertension: Secondary | ICD-10-CM | POA: Insufficient documentation

## 2016-05-29 DIAGNOSIS — L03113 Cellulitis of right upper limb: Secondary | ICD-10-CM | POA: Diagnosis not present

## 2016-05-29 DIAGNOSIS — F17200 Nicotine dependence, unspecified, uncomplicated: Secondary | ICD-10-CM | POA: Insufficient documentation

## 2016-05-29 IMAGING — CR DG ELBOW COMPLETE 3+V*R*
5 series · 5 of 5 positions shown · non-contrast
Comparison: No recent prior.

CLINICAL DATA: Swelling.

EXAM:
RIGHT ELBOW - COMPLETE 3+ VIEW

[x elbow obl right (1 of 3)]
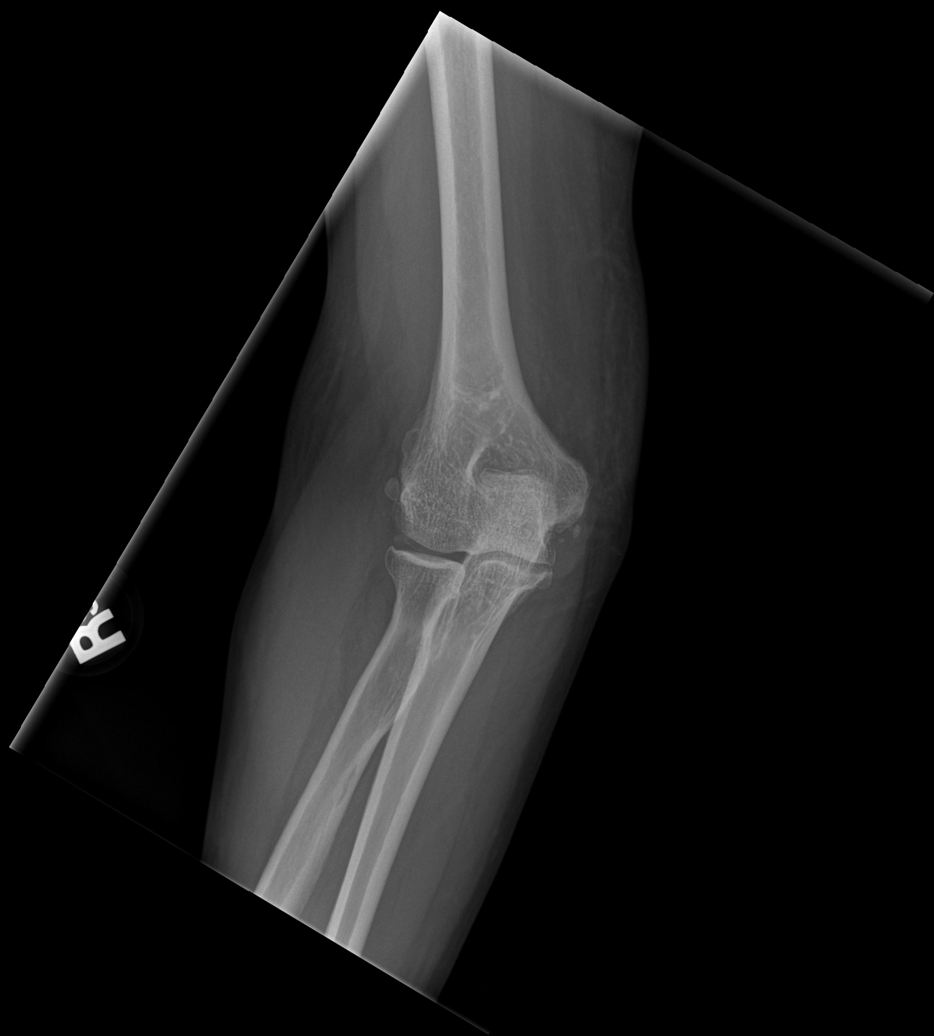

[x elbow ap right]
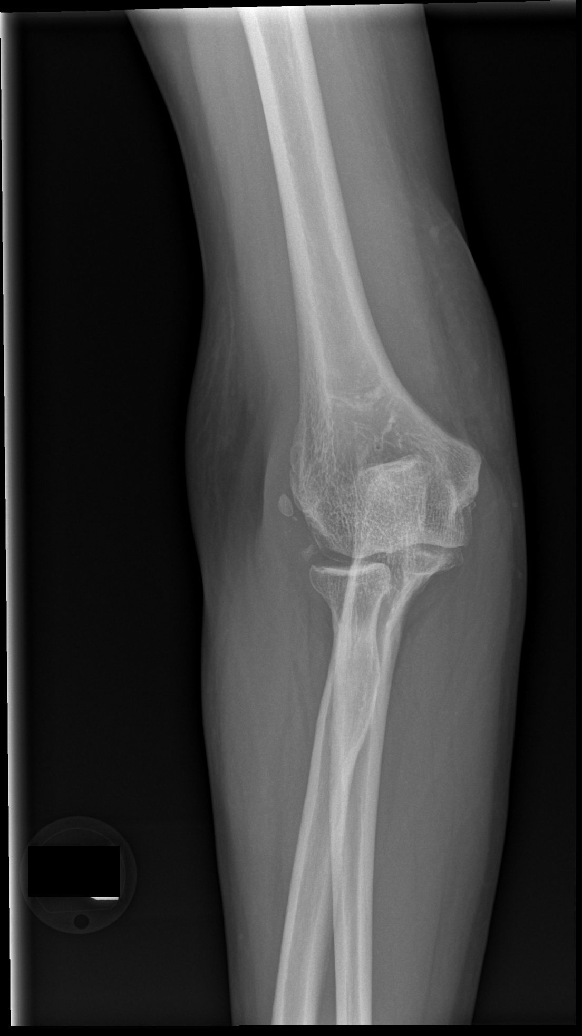

[x elbow obl right (2 of 3)]
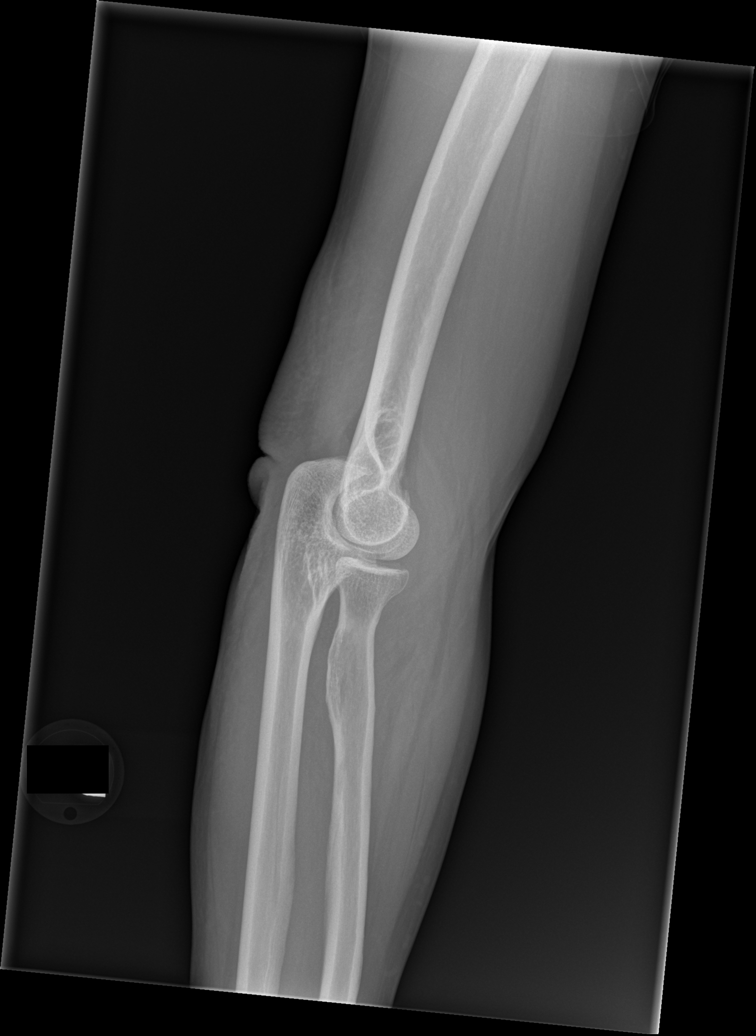

[x elbow lat right]
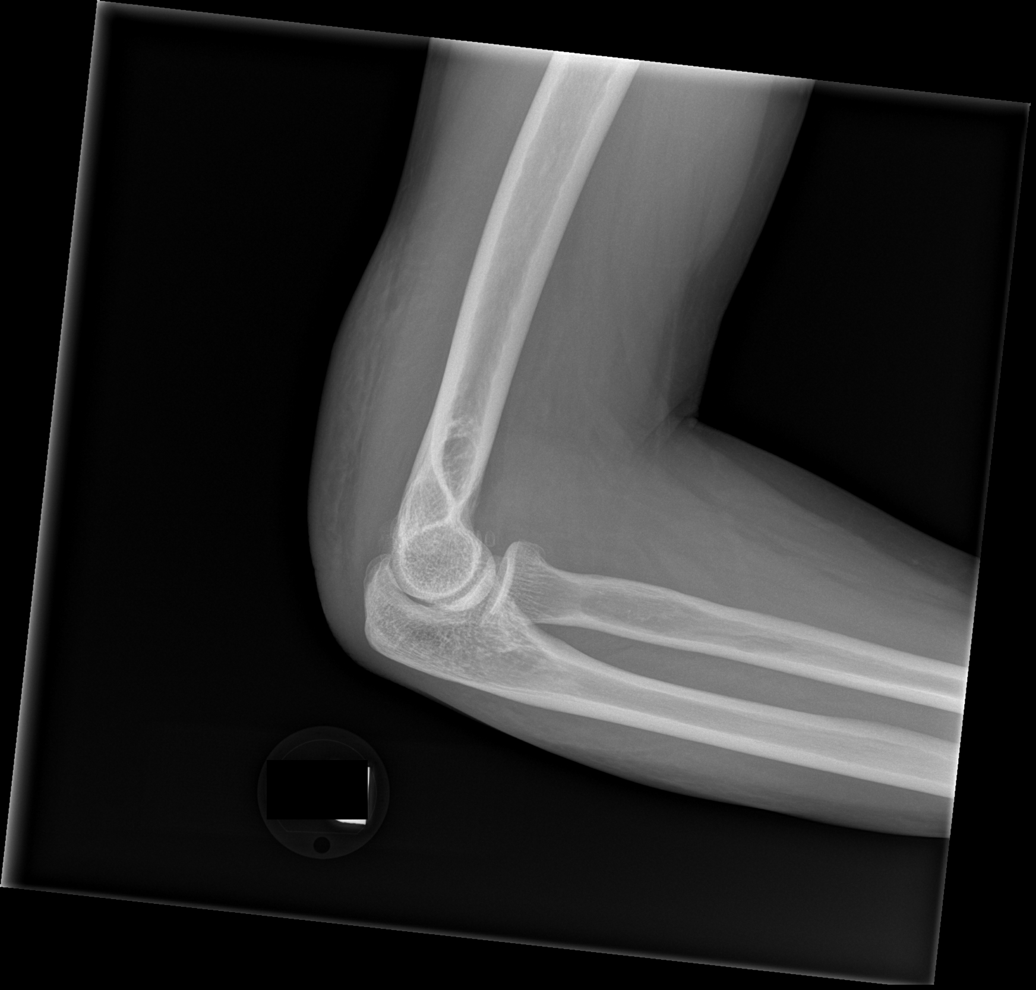

[x elbow obl right (3 of 3)]
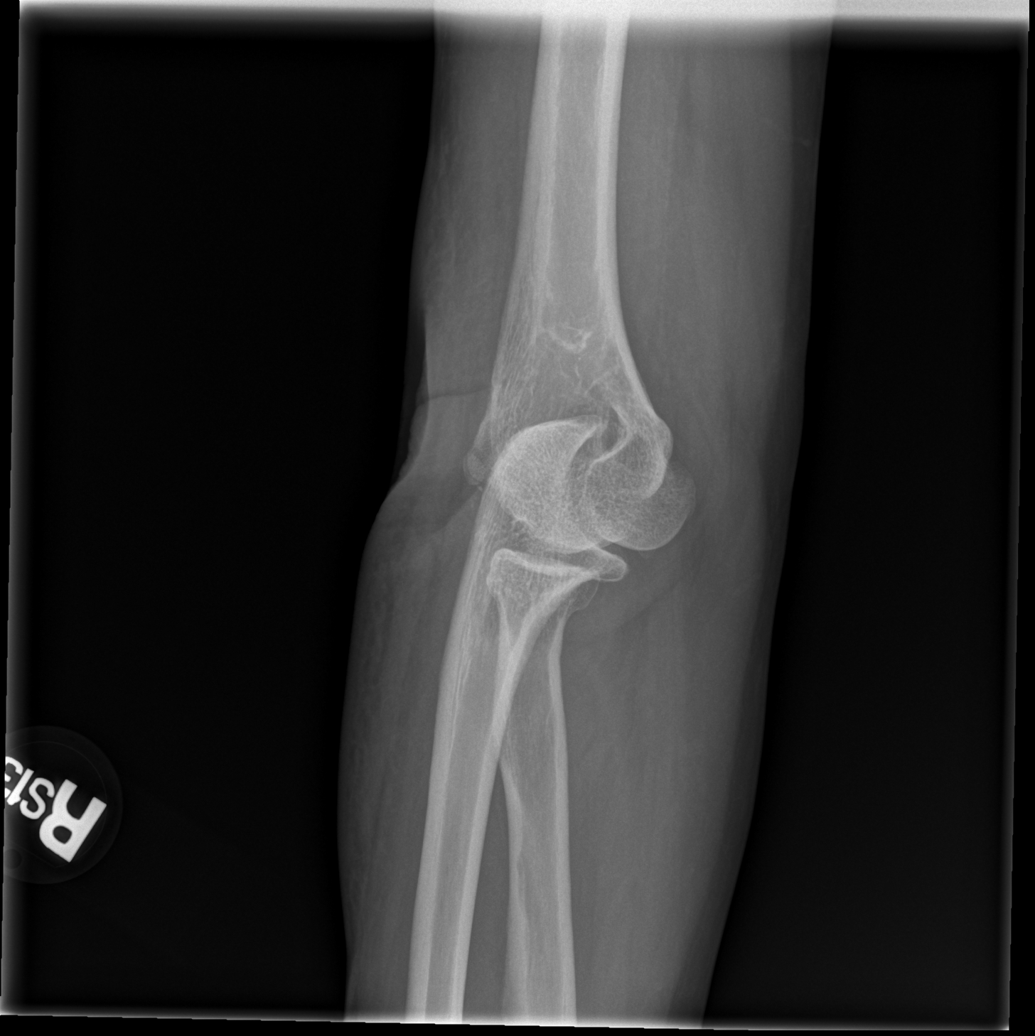

[5 of 5 positions shown; findings below may reference images not displayed]

FINDINGS: Diffuse soft tissue swelling. Diffuse degenerative change with loose
bodies noted. Corticated bony density noted adjacent to the
mediolateral humeral epicondyles consistent with old healed injuries
or non fused secondary ossification centers.
IMPRESSION: 1. Diffuse soft tissue swelling.

2. Prominent degenerative changes right elbow with loose bodies.
Corticated small bony densities noted adjacent to the humeral
epicondyles most likely secondary to old injuries and/or non fused
secondary ossification centers.

## 2016-05-29 MED ORDER — TRIAMCINOLONE ACETONIDE 0.1 % EX CREA
TOPICAL_CREAM | Freq: Once | CUTANEOUS | Status: AC
  Administered 2016-05-29: 15:00:00 via TOPICAL
  Filled 2016-05-29: qty 15

## 2016-05-29 MED ORDER — FAMOTIDINE 20 MG PO TABS: 20.0000 mg | ORAL_TABLET | Freq: Two times a day (BID) | ORAL | 0 refills | Status: DC

## 2016-05-29 MED ORDER — CEPHALEXIN 500 MG PO CAPS
500.0000 mg | ORAL_CAPSULE | Freq: Once | ORAL | Status: AC
  Administered 2016-05-29: 500 mg via ORAL
  Filled 2016-05-29: qty 1

## 2016-05-29 MED ORDER — CEPHALEXIN 500 MG PO CAPS: 500.0000 mg | ORAL_CAPSULE | Freq: Once | ORAL | Status: DC

## 2016-05-29 MED ORDER — TRIAMCINOLONE ACETONIDE 0.1 % EX CREA: 1.0000 "application " | TOPICAL_CREAM | Freq: Two times a day (BID) | CUTANEOUS | 0 refills | Status: DC

## 2016-05-29 MED ORDER — CEPHALEXIN 500 MG PO CAPS: 500.0000 mg | ORAL_CAPSULE | Freq: Four times a day (QID) | ORAL | 0 refills | Status: AC

## 2016-05-29 NOTE — Discharge Instructions (Signed)
Please keep taking the previously prescribed antibiotic, as directed on the bottle, until finished. Begin taking the new antibiotic today. You will take it 4 times a day for 10 days. Do not stop taking this medication before it is finished, even if you feel better or symptoms resolve.  May use ibuprofen, naproxen, or Tylenol for pain. Apply the triamcinolone cream up to twice a day as needed for itching. Do not apply this cream into open wounds. Continue taking 25 mg of Benadryl every 6 hours for the next three days. Take the Pepcid, as directed, for the next three days.  Return to the ED in 2 days for a wound check. Return sooner should you develop fever, spreading redness beyond the drawn lines, increased swelling, inability to fully move the elbow, or any other major concerns.

## 2016-05-29 NOTE — ED Triage Notes (Signed)
Pt has swelling and redness in r/arm. Multiple isolated red raised spots noted . Pt stated that she thinks that they are bug bites. C/o pain and itching since yesterday. Pt stated that she took 4 dosages of an antibiotics and benadryl.

## 2016-05-29 NOTE — ED Provider Notes (Signed)
[facility]   CSN: [id number] Arrival date & time: 05/29/16  1316  By signing my name below, I, Freida Busman, attest that this documentation has been prepared under the direction and in the presence of Shawn Joy, PA-C. Electronically Signed: Freida Busman, Scribe. 05/29/2016. 3:15 PM.  History   Chief Complaint Chief Complaint  Patient presents with  . Insect Bite  . Arm Pain  . Cellulitis    The history is provided by the patient. No language interpreter was used.    HPI Comments:  Katrina Schmidt is a 56 y.o. female who presents to the Emergency Department complaining of an area of redness to the right elbow region since last night. She reports associated raised bumps that were first noticed when she woke up with 2 days ago. Pt notes she slept with window open and believes the bumps are insects bites. Pt describes the area as itchy, painful, and hot to touch. She has noticed mild drainage from the site of one of the bumps. She has been taking benadryl with minimal relief. Pt states she is taking 100 mg of an antibiotic BID which she started 5 days ago; states she was placed on them by her PCP for similar bumps last week. Denies fever/chills, nausea/vomiting, hives, shortness of breath, chest pain, or any other complaints.  Patient adds that she has had local allergic reactions to mosquito or other insect bites in the past and that is why she started taking the Benadryl.   Past Medical History:  Diagnosis Date  . Anxiety   . Chronic bronchitis (HCC)   . Hyperlipidemia   . Hypertension     There are no active problems to display for this patient.   Past Surgical History:  Procedure Laterality Date  . HERNIA REPAIR      OB History    No data available       Home Medications    Prior to Admission medications   Medication Sig Start Date End Date Taking? Authorizing Provider  cephALEXin (KEFLEX) 500 MG capsule Take 1 capsule (500 mg total) by  mouth 4 (four) times daily. 05/29/16 06/08/16  Shawn C Joy, PA-C  doxycycline (VIBRAMYCIN) 100 MG capsule Take 1 capsule (100 mg total) by mouth 2 (two) times daily. One po bid x 7 days 09/25/15   Mercedes Street, PA-C  famotidine (PEPCID) 20 MG tablet Take 1 tablet (20 mg total) by mouth 2 (two) times daily. 05/29/16 06/01/16  Shawn C Joy, PA-C  HYDROcodone-acetaminophen (NORCO) 5-325 MG tablet Take 1 tablet by mouth every 6 (six) hours as needed for severe pain. 09/25/15   Mercedes Street, PA-C  naproxen (NAPROSYN) 500 MG tablet Take 1 tablet (500 mg total) by mouth 2 (two) times daily as needed for mild pain, moderate pain or headache (TAKE WITH MEALS.). 09/25/15   Mercedes Street, PA-C  triamcinolone cream (KENALOG) 0.1 % Apply 1 application topically 2 (two) times daily. 05/29/16   Anselm Pancoast, PA-C    Family History Family History  Problem Relation Age of Onset  . Hypertension Mother     Social History Social History  Substance Use Topics  . Smoking status: Current Every Day Smoker  . Smokeless tobacco: Never Used  . Alcohol use Yes     Comment: Drinks 60oz beer daily     Allergies   Patient has no known allergies.   Review of Systems Review of Systems  Constitutional: Negative for chills and fever.  HENT: Negative  for facial swelling and trouble swallowing.   Respiratory: Negative for shortness of breath.   Cardiovascular: Negative for chest pain.  Gastrointestinal: Negative for nausea and vomiting.  Skin: Positive for color change.  Neurological: Negative for weakness and numbness.  All other systems reviewed and are negative.   Physical Exam Updated Vital Signs BP 157/97 (BP Location: Left Arm)   Pulse 98   Temp 97.8 F (36.6 C) (Oral)   Resp 18   Wt 161 lb 4 oz (73.1 kg)   SpO2 96%   BMI 26.83 kg/m   Physical Exam  Constitutional: She appears well-developed and well-nourished. No distress.  HENT:  Head: Normocephalic and atraumatic.  Mouth/Throat: Oropharynx  is clear and moist.  Eyes: Conjunctivae are normal.  Neck: Neck supple.  Cardiovascular: Normal rate, regular rhythm, normal heart sounds and intact distal pulses.   Pulmonary/Chest: Effort normal and breath sounds normal. No respiratory distress.  Abdominal: Soft. There is no tenderness. There is no guarding.  Musculoskeletal: She exhibits no edema.  There is no tenderness or swelling to the right elbow joint. No noted effusion. Full range of motion without pain in the right elbow.   Lymphadenopathy:    She has no cervical adenopathy.  Neurological: She is alert.  Strength 5 out of 5 in the bilateral upper extremities. No sensory deficits.  Skin: Skin is warm and dry. Capillary refill takes less than 2 seconds. She is not diaphoretic.  3 discrete raised, rounded, convex lesions on the posterior right elbow with surrounding erythema and area of raised skin consistent with superficial swelling.   Psychiatric: She has a normal mood and affect. Her behavior is normal.  Nursing note and vitals reviewed.          ED Treatments / Results  DIAGNOSTIC STUDIES:  Oxygen Saturation is 96% on RA, normal by my interpretation.    COORDINATION OF CARE:  2:18 PM Will discharge with Pepcid and Keflex. Pt instructed to continue taking Benadryl and to follow up with PCP. Discussed treatment plan with pt at bedside and pt agreed to plan.  Labs (all labs ordered are listed, but only abnormal results are displayed) Labs Reviewed - No data to display  EKG  EKG Interpretation None       Radiology Dg Elbow Complete Right  Result Date: 05/29/2016 CLINICAL DATA:  Swelling. EXAM: RIGHT ELBOW - COMPLETE 3+ VIEW COMPARISON:  No recent prior. FINDINGS: Diffuse soft tissue swelling. Diffuse degenerative change with loose bodies noted. Corticated bony density noted adjacent to the mediolateral humeral epicondyles consistent with old healed injuries or non fused secondary ossification centers.  IMPRESSION: 1. Diffuse soft tissue swelling. 2. Prominent degenerative changes right elbow with loose bodies. Corticated small bony densities noted adjacent to the humeral epicondyles most likely secondary to old injuries and/or non fused secondary ossification centers. Electronically Signed   By: Maisie Fushomas  Register   On: 05/29/2016 14:47    Procedures Procedures (including critical care time)  Medications Ordered in ED Medications  triamcinolone cream (KENALOG) 0.1 % ( Topical Given 05/29/16 1526)  cephALEXin (KEFLEX) capsule 500 mg (500 mg Oral Given 05/29/16 1601)     Initial Impression / Assessment and Plan / ED Course  I have reviewed the triage vital signs and the nursing notes.  Pertinent labs & imaging results that were available during my care of the patient were reviewed by me and considered in my medical decision making (see chart for details).     Patient presents with an  area of redness surrounding superficial lesions on the right elbow. Suspect cellulitis versus local allergic reaction. Patient is nontoxic appearing, afebrile, not tachycardic, not tachypneic, not hypotensive, maintains adequate SPO2 on room air, and is in no apparent distress. Patient has no signs of sepsis or other serious or life-threatening condition. Very low suspicion for septic joint. I suspect that the antibiotic the patient was prescribed is doxycycline. She states that she will be finishing this prescription tomorrow. She was advised to continue taking this medication, as prescribed. We will add a 10 day course of Keflex. She is to return in 2 days for recheck. She was given strict return precautions. Patient voices understanding of all instructions and is comfortable with discharge.     Findings and plan of care discussed with Derwood Kaplan, MD.    Vitals:   05/29/16 1320 05/29/16 1333 05/29/16 1606  BP: 157/97  166/82  Pulse: 98  72  Resp: 18  21  Temp: 97.8 F (36.6 C)    TempSrc: Oral      SpO2: 96%  99%  Weight:  73.1 kg       Final Clinical Impressions(s) / ED Diagnoses   Final diagnoses:  Cellulitis of right upper extremity    New Prescriptions Discharge Medication List as of 05/29/2016  3:58 PM    START taking these medications   Details  cephALEXin (KEFLEX) 500 MG capsule Take 1 capsule (500 mg total) by mouth 4 (four) times daily., Starting Tue 05/29/2016, Until Fri 06/08/2016, Print    famotidine (PEPCID) 20 MG tablet Take 1 tablet (20 mg total) by mouth 2 (two) times daily., Starting Tue 05/29/2016, Until Fri 06/01/2016, Print    triamcinolone cream (KENALOG) 0.1 % Apply 1 application topically 2 (two) times daily., Starting Tue 05/29/2016, Print       I personally performed the services described in this documentation, which was scribed in my presence. The recorded information has been reviewed and is accurate.    Anselm Pancoast, PA-C 05/30/16 1610    Derwood Kaplan, MD 05/30/16 1553

## 2016-05-29 NOTE — ED Notes (Signed)
Pt ambulatory and independent at discharge.  Verbalized understanding of discharge instructions 

## 2016-09-23 ENCOUNTER — Encounter (HOSPITAL_COMMUNITY): Payer: Self-pay | Admitting: Emergency Medicine

## 2016-09-23 ENCOUNTER — Emergency Department (HOSPITAL_COMMUNITY)
Admission: EM | Admit: 2016-09-23 | Discharge: 2016-09-23 | Disposition: A | Discharge status: Home / Self Care | Payer: Medicaid Other | Attending: Emergency Medicine | Admitting: Emergency Medicine

## 2016-09-23 DIAGNOSIS — I10 Essential (primary) hypertension: Secondary | ICD-10-CM | POA: Insufficient documentation

## 2016-09-23 DIAGNOSIS — L03113 Cellulitis of right upper limb: Secondary | ICD-10-CM | POA: Diagnosis not present

## 2016-09-23 DIAGNOSIS — Z79899 Other long term (current) drug therapy: Secondary | ICD-10-CM | POA: Insufficient documentation

## 2016-09-23 DIAGNOSIS — F17200 Nicotine dependence, unspecified, uncomplicated: Secondary | ICD-10-CM | POA: Insufficient documentation

## 2016-09-23 DIAGNOSIS — M7989 Other specified soft tissue disorders: Secondary | ICD-10-CM | POA: Diagnosis present

## 2016-09-23 HISTORY — DX: Unspecified osteoarthritis, unspecified site: M19.90

## 2016-09-23 MED ORDER — SULFAMETHOXAZOLE-TRIMETHOPRIM 800-160 MG PO TABS: 1.0000 | ORAL_TABLET | Freq: Two times a day (BID) | ORAL | 0 refills | Status: AC

## 2016-09-23 MED ORDER — TRIAMCINOLONE ACETONIDE 0.1 % EX CREA
TOPICAL_CREAM | Freq: Once | CUTANEOUS | Status: AC
  Administered 2016-09-23: 16:00:00 via TOPICAL
  Filled 2016-09-23: qty 15

## 2016-09-23 MED ORDER — TRIAMCINOLONE ACETONIDE 0.1 % EX CREA: 1.0000 "application " | TOPICAL_CREAM | Freq: Two times a day (BID) | CUTANEOUS | 0 refills | Status: DC

## 2016-09-23 MED ORDER — SULFAMETHOXAZOLE-TRIMETHOPRIM 800-160 MG PO TABS
1.0000 | ORAL_TABLET | Freq: Once | ORAL | Status: AC
  Administered 2016-09-23: 1 via ORAL
  Filled 2016-09-23: qty 1

## 2016-09-23 NOTE — ED Provider Notes (Signed)
[facility]-EMERGENCY DEPT Provider Note    By signing my name below, I, Earmon PhoenixJennifer Waddell, attest that this documentation has been prepared under the direction and in the presence of [facility] Sajan Cheatwood, PA-C. Electronically Signed: Earmon PhoenixJennifer Waddell, ED Scribe. 09/23/16. 3:02 PM.    History   Chief Complaint Chief Complaint  Patient presents with  . Insect Bite   The history is provided by the patient and medical records. No language interpreter was used.    Katrina Schmidt is a 56 y.o. female who presents to the Emergency Department complaining of a suspected insect bite to the right hand that she noticed last night. She reports associated itching, swelling and redness that is spreading. She reports stinging pain and warmth of the area. She has not taken anything for pain. There are no modifying factors noted. She denies fever, chills, nausea, vomiting, numbness, tingling or weakness of the right hand or arm, new muscle spasms. She denies any trauma, injury or fall. She denies h/o DM or any immune deficiencies. Her last tetanus vaccination was one year ago. She denies seeing an insect bite her. No new detergents, soaps, or shampoos.    Past Medical History:  Diagnosis Date  . Anxiety   . Chronic bronchitis (HCC)   . DJD (degenerative joint disease)   . Hyperlipidemia   . Hypertension     There are no active problems to display for this patient.   Past Surgical History:  Procedure Laterality Date  . HERNIA REPAIR      OB History    No data available       Home Medications    Prior to Admission medications   Medication Sig Start Date End Date Taking? Authorizing Provider  doxycycline (VIBRAMYCIN) 100 MG capsule Take 1 capsule (100 mg total) by mouth 2 (two) times daily. One po bid x 7 days 09/25/15   Street, [place]Mercedes, PA-C  famotidine (PEPCID) 20 MG tablet Take 1 tablet (20 mg total) by mouth 2 (two) times daily. 05/29/16 06/01/16  Joy, Hillard DankerShawn C, PA-C  HYDROcodone-acetaminophen (NORCO)  5-325 MG tablet Take 1 tablet by mouth every 6 (six) hours as needed for severe pain. 09/25/15   Street, Cyp[place]Mercedes, PA-C  naproxen (NAPROSYN) 500 MG tablet Take 1 tablet (500 mg total) by mouth 2 (two) times daily as needed for mild pain, moderate pain or headache (TAKE WITH MEALS.). 09/25/15   Street, Mercedes, PA-C  sulfamethoxazole-trimethoprim (BACTRIM DS,SEPTRA DS) 800-160 MG tablet Take 1 tablet by mouth 2 (two) times daily. 09/23/16 09/30/16  Michela PitcherFawze, Cythnia Osmun A, PA-C  triamcinolone cream (KENALOG) 0.1 % Apply 1 application topically 2 (two) times daily. 09/23/16   Jeanie SewerFawze, Elan Mcelvain A, PA-C    Family History Family History  Problem Relation Age of Onset  . Hypertension Mother     Social History Social History  Substance Use Topics  . Smoking status: Current Every Day Smoker  . Smokeless tobacco: Never Used  . Alcohol use Yes     Comment: Drinks 60oz beer daily     Allergies   Other   Review of Systems Review of Systems  Constitutional: Negative for chills and fever.  Musculoskeletal: Negative for myalgias.  Skin: Positive for color change and wound.  Neurological: Negative for weakness and numbness.  All other systems reviewed and are negative.    Physical Exam Updated Vital Signs BP (!) 147/84 (BP Location: Right Arm)   Pulse 87   Temp 98.7 F (37.1 C) (Oral)   Resp 16   SpO2  96%   Physical Exam  Constitutional: She is oriented to person, place, and time. She appears well-developed and well-nourished.  HENT:  Head: Normocephalic and atraumatic.  Eyes: Conjunctivae are normal. Right eye exhibits no discharge. Left eye exhibits no discharge.  Neck: Normal range of motion.  Cardiovascular: Normal rate.   Radial pulses 2+ bilaterally.  Pulmonary/Chest: Effort normal.  Abdominal: Soft. She exhibits no distension.  Musculoskeletal: Normal range of motion.  Lymphadenopathy:  No axillary LAD.  Neurological: She is alert and oriented to person, place, and time.  Skin: Skin is  warm and dry. There is erythema.  Dorsum of right hand along middle of fifth metacarpal with 3 mm raised macule that is erythematous and firm. No stinger, drainage or bleeding noted. There is surrounding erythema extending to ulnar aspect of the wrist and generalized swelling of this area. The fourth and fifth metacarpals are TTP with tenderness along the hypothenar eminence. No snuff box tenderness. Normal ROM and strength of right wrist and digits. No deformity or crepitus noted.  Psychiatric: She has a normal mood and affect. Her behavior is normal.  Nursing note and vitals reviewed.    ED Treatments / Results  DIAGNOSTIC STUDIES: Oxygen Saturation is 96% on RA, adequate by my interpretation.   COORDINATION OF CARE: 2:40 PM- Will prescribe triamcinolone cream and antibiotic. Recommended OTC Benadryl as well. Return precautions discussed. Pt verbalizes understanding and agrees to plan.  Medications  sulfamethoxazole-trimethoprim (BACTRIM DS,SEPTRA DS) 800-160 MG per tablet 1 tablet (not administered)     Labs (all labs ordered are listed, but only abnormal results are displayed) Labs Reviewed - No data to display  EKG  EKG Interpretation None       Radiology No results found.  Procedures Procedures (including critical care time)  Medications Ordered in ED Medications  sulfamethoxazole-trimethoprim (BACTRIM DS,SEPTRA DS) 800-160 MG per tablet 1 tablet (not administered)     Initial Impression / Assessment and Plan / ED Course  I have reviewed the triage vital signs and the nursing notes.  Pertinent labs & imaging results that were available during my care of the patient were reviewed by me and considered in my medical decision making (see chart for details).     Patient presentation consistent with insect bitewith subsequent development of cellulitis. Afebrile. No tachycardia, hypotension or other symptoms suggestive of severe infection. Low suspicion of gout,  fracture, or septic joint. Area has been demarcated and pt advised to follow up for wound check in 2-3 days, sooner for worsening systemic symptoms, new lymphangitis, or significant spread of erythema past line of demarcation. Will discharge with Bactrim DS and Triamcinolone cream. Return precautions discussed. Pt verbalized understanding of and agreement with plan and is safe for discharge home at this time.   Final Clinical Impressions(s) / ED Diagnoses   Final diagnoses:  Cellulitis of right upper extremity    New Prescriptions New Prescriptions   SULFAMETHOXAZOLE-TRIMETHOPRIM (BACTRIM DS,SEPTRA DS) 800-160 MG TABLET    Take 1 tablet by mouth 2 (two) times daily.   TRIAMCINOLONE CREAM (KENALOG) 0.1 %    Apply 1 application topically 2 (two) times daily.    I personally performed the services described in this documentation, which was scribed in my presence. The recorded information has been reviewed and is accurate.      Jeanie Sewer, PA-C 09/23/16 1506    Doug Sou, MD 09/23/16 1610

## 2016-09-23 NOTE — Discharge Instructions (Signed)
Please take all of your antibiotics until finished!   You may develop abdominal discomfort or diarrhea from the antibiotic.  You may help offset this with probiotics which you can buy or get in yogurt. Do not eat  or take the probiotics until 2 hours after your antibiotic.   You may use triamcinolone cream for itching. You may apply warm or cold compresses to the affected area. Follow-up with your primary care for reevaluation. Return to the ED if any concerning signs or symptoms develop including spreading of the infection.

## 2016-09-23 NOTE — ED Triage Notes (Signed)
Pt from home with complaints of swelling secondary to insect bite on right hand. Pt states she noticed the area yesterday night. Pt reports itching and warmth to the area. Pt also reports a stinging feeling to the area, rating it 3/10

## 2018-01-10 ENCOUNTER — Emergency Department (HOSPITAL_COMMUNITY): Payer: Medicaid Other

## 2018-01-10 ENCOUNTER — Other Ambulatory Visit: Payer: Self-pay

## 2018-01-10 ENCOUNTER — Emergency Department (HOSPITAL_BASED_OUTPATIENT_CLINIC_OR_DEPARTMENT_OTHER): Payer: Medicaid Other

## 2018-01-10 ENCOUNTER — Encounter (HOSPITAL_COMMUNITY): Payer: Self-pay

## 2018-01-10 ENCOUNTER — Emergency Department (HOSPITAL_COMMUNITY)
Admission: EM | Admit: 2018-01-10 | Discharge: 2018-01-10 | Disposition: A | Discharge status: Home / Self Care | Payer: Medicaid Other | Attending: Emergency Medicine | Admitting: Emergency Medicine

## 2018-01-10 DIAGNOSIS — I10 Essential (primary) hypertension: Secondary | ICD-10-CM | POA: Diagnosis not present

## 2018-01-10 DIAGNOSIS — M79609 Pain in unspecified limb: Secondary | ICD-10-CM

## 2018-01-10 DIAGNOSIS — M25511 Pain in right shoulder: Secondary | ICD-10-CM | POA: Diagnosis not present

## 2018-01-10 DIAGNOSIS — F17210 Nicotine dependence, cigarettes, uncomplicated: Secondary | ICD-10-CM | POA: Insufficient documentation

## 2018-01-10 DIAGNOSIS — M79601 Pain in right arm: Secondary | ICD-10-CM | POA: Diagnosis present

## 2018-01-10 HISTORY — DX: Sciatica, unspecified side: M54.30

## 2018-01-10 HISTORY — DX: Other intervertebral disc degeneration, lumbosacral region: M51.37

## 2018-01-10 IMAGING — CR DG SHOULDER 2+V*R*
2 series · 2 of 2 positions shown · non-contrast
Comparison: None.

CLINICAL DATA: Chronic right shoulder pain without known injury.

EXAM:
RIGHT SHOULDER - 2+ VIEW

[w shoulder external right]
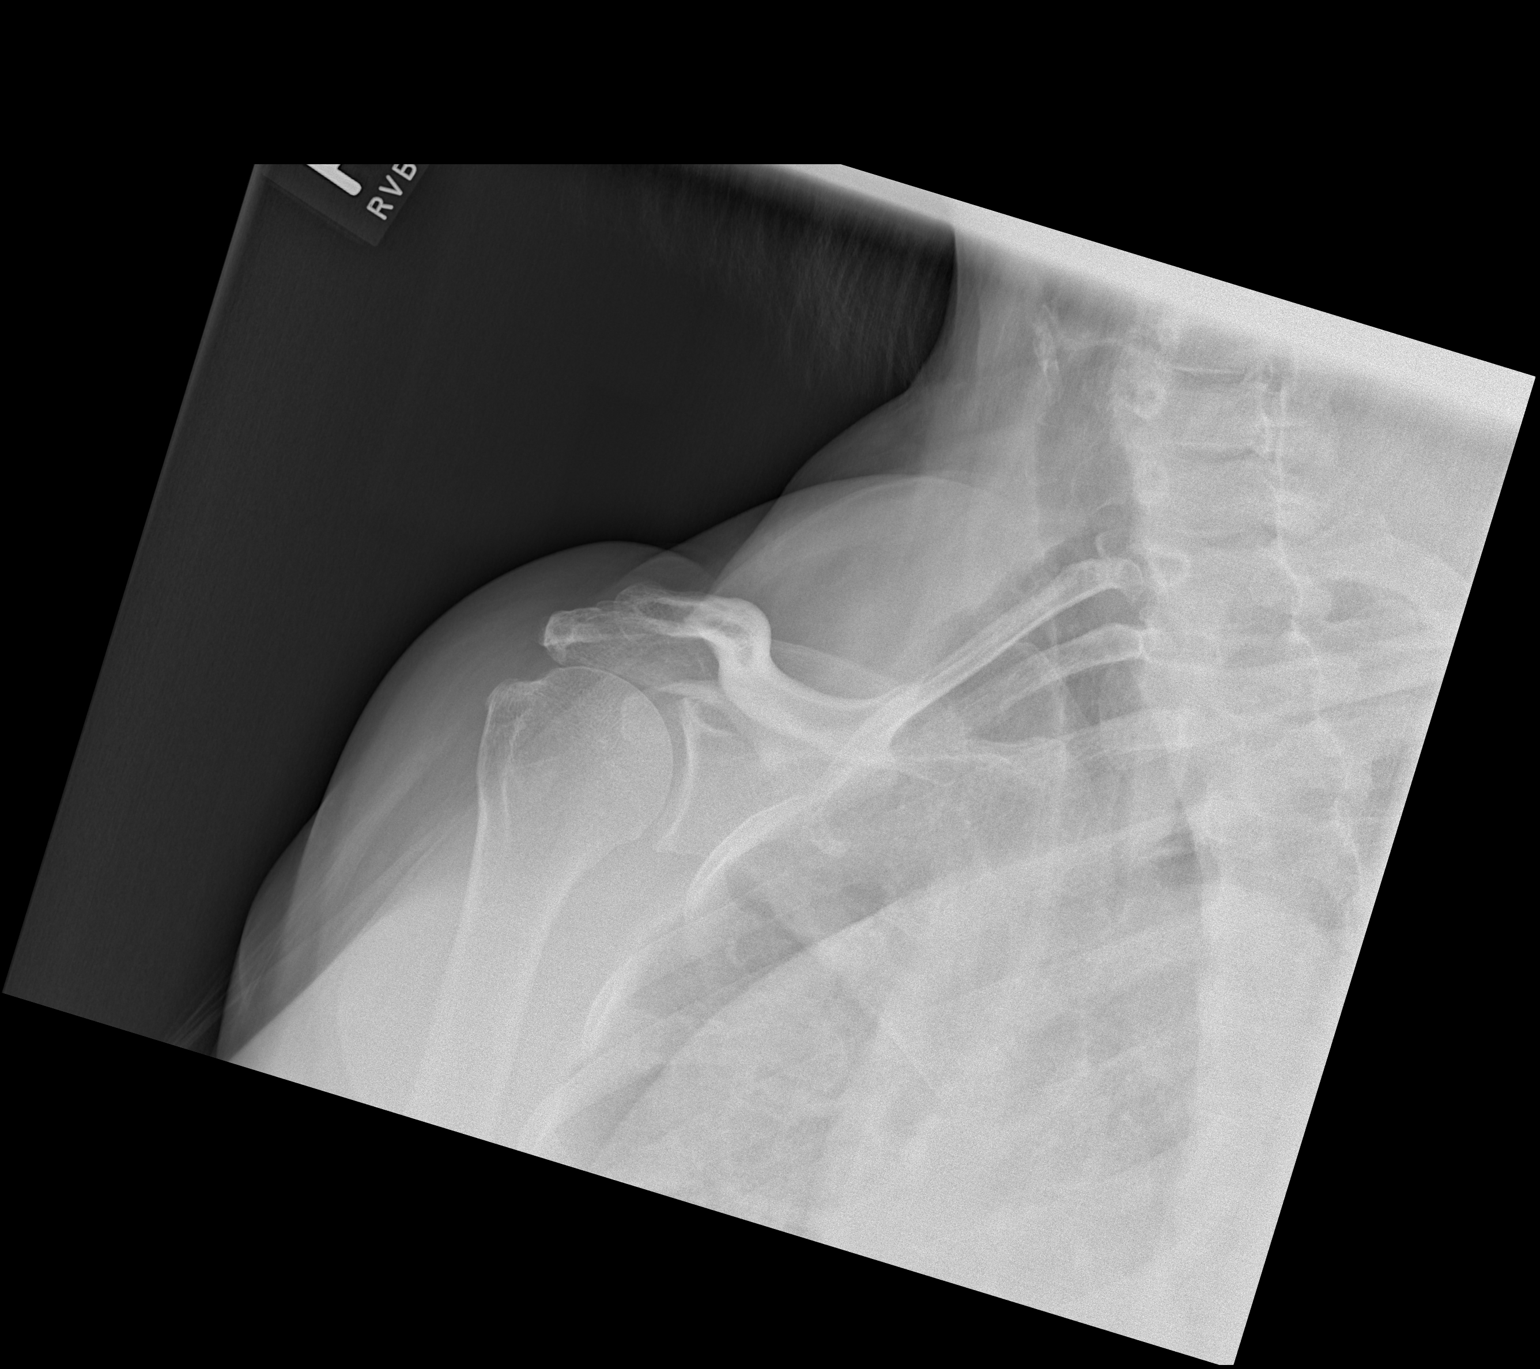

[w shoulder y-view right]
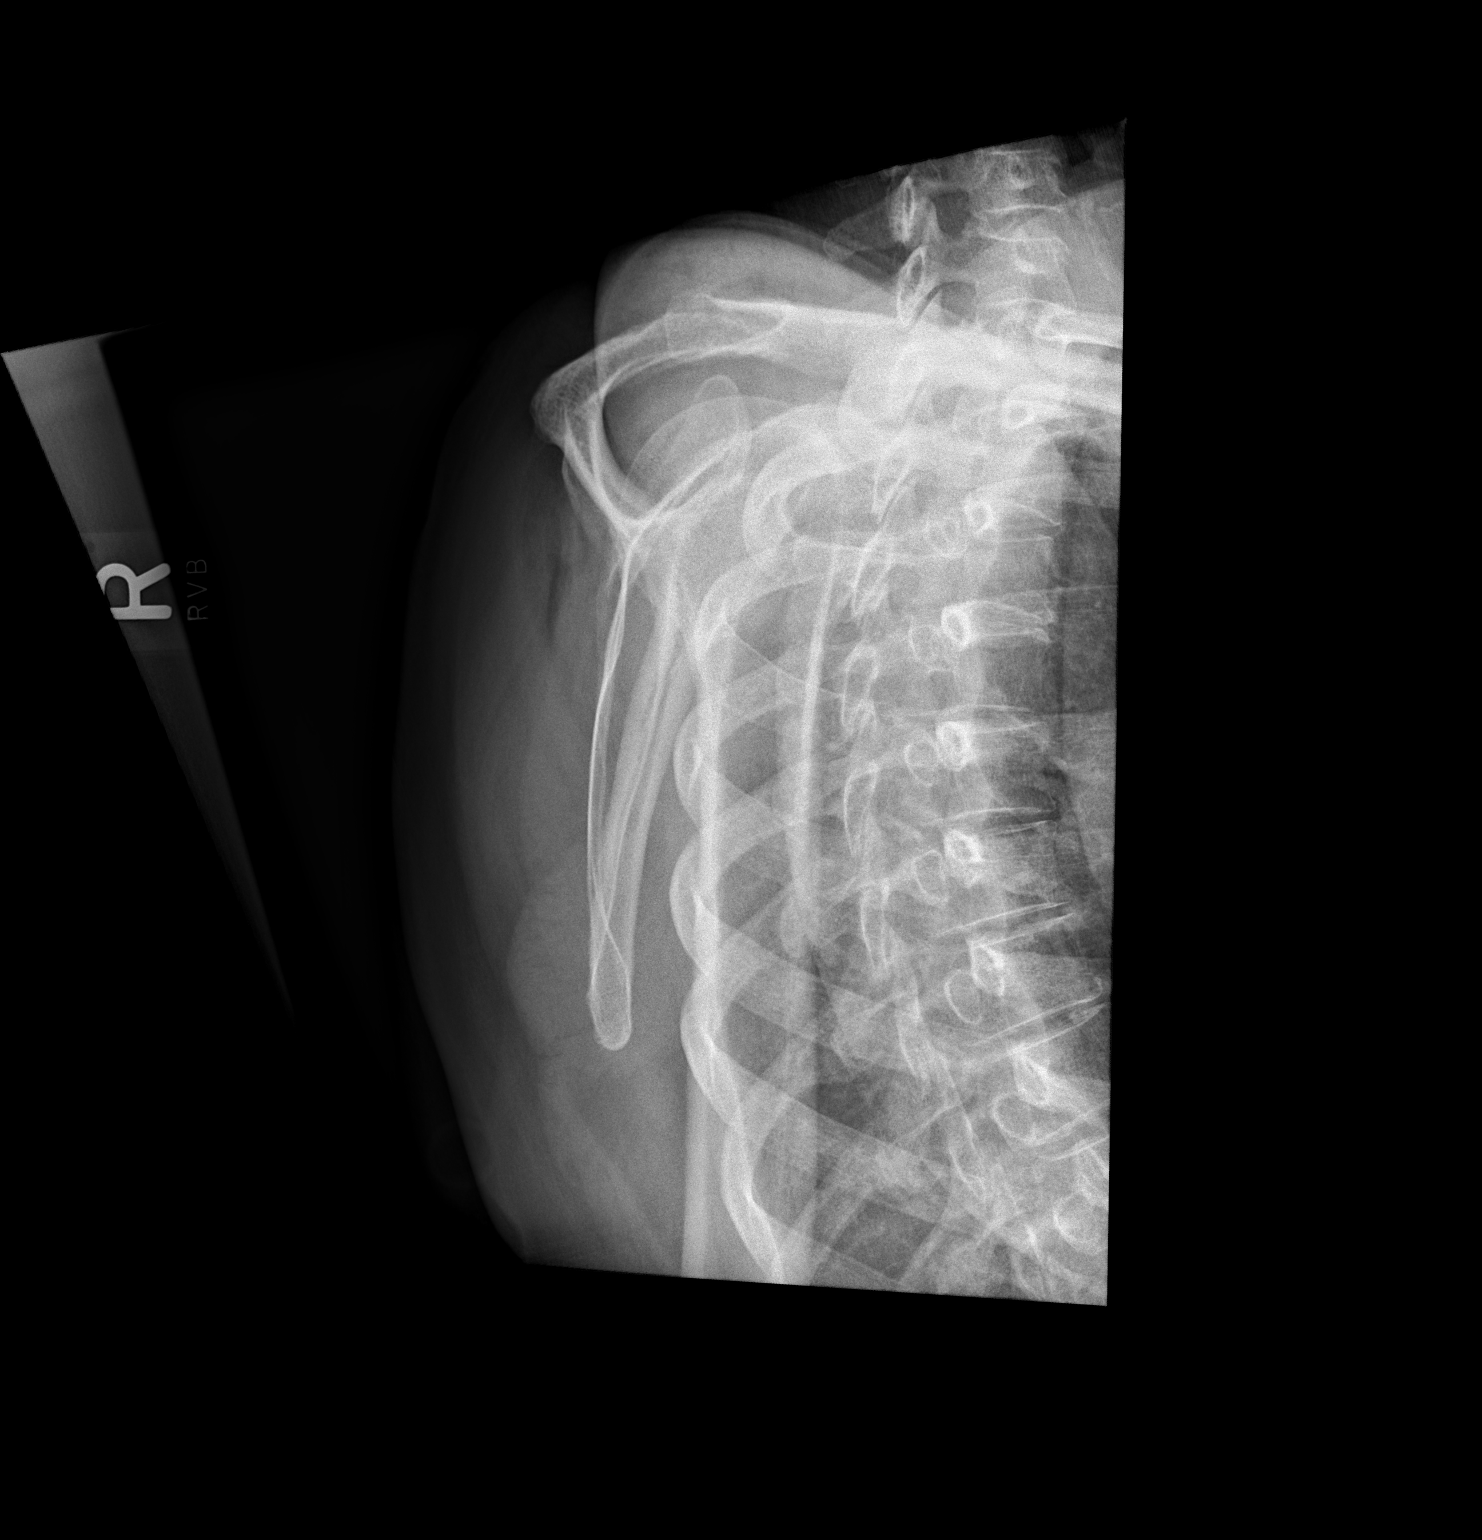

[2 of 2 positions shown; findings below may reference images not displayed]

FINDINGS: There is no evidence of fracture or dislocation. There is no
evidence of arthropathy or other focal bone abnormality. Soft
tissues are unremarkable.
IMPRESSION: Normal right shoulder.

## 2018-01-10 IMAGING — CR DG FOREARM 2V*R*
2 series · 2 of 2 positions shown · non-contrast
Comparison: None.

CLINICAL DATA: Chronic right forearm pain without known injury.

EXAM:
RIGHT FOREARM - 2 VIEW

[x forearm lat right]
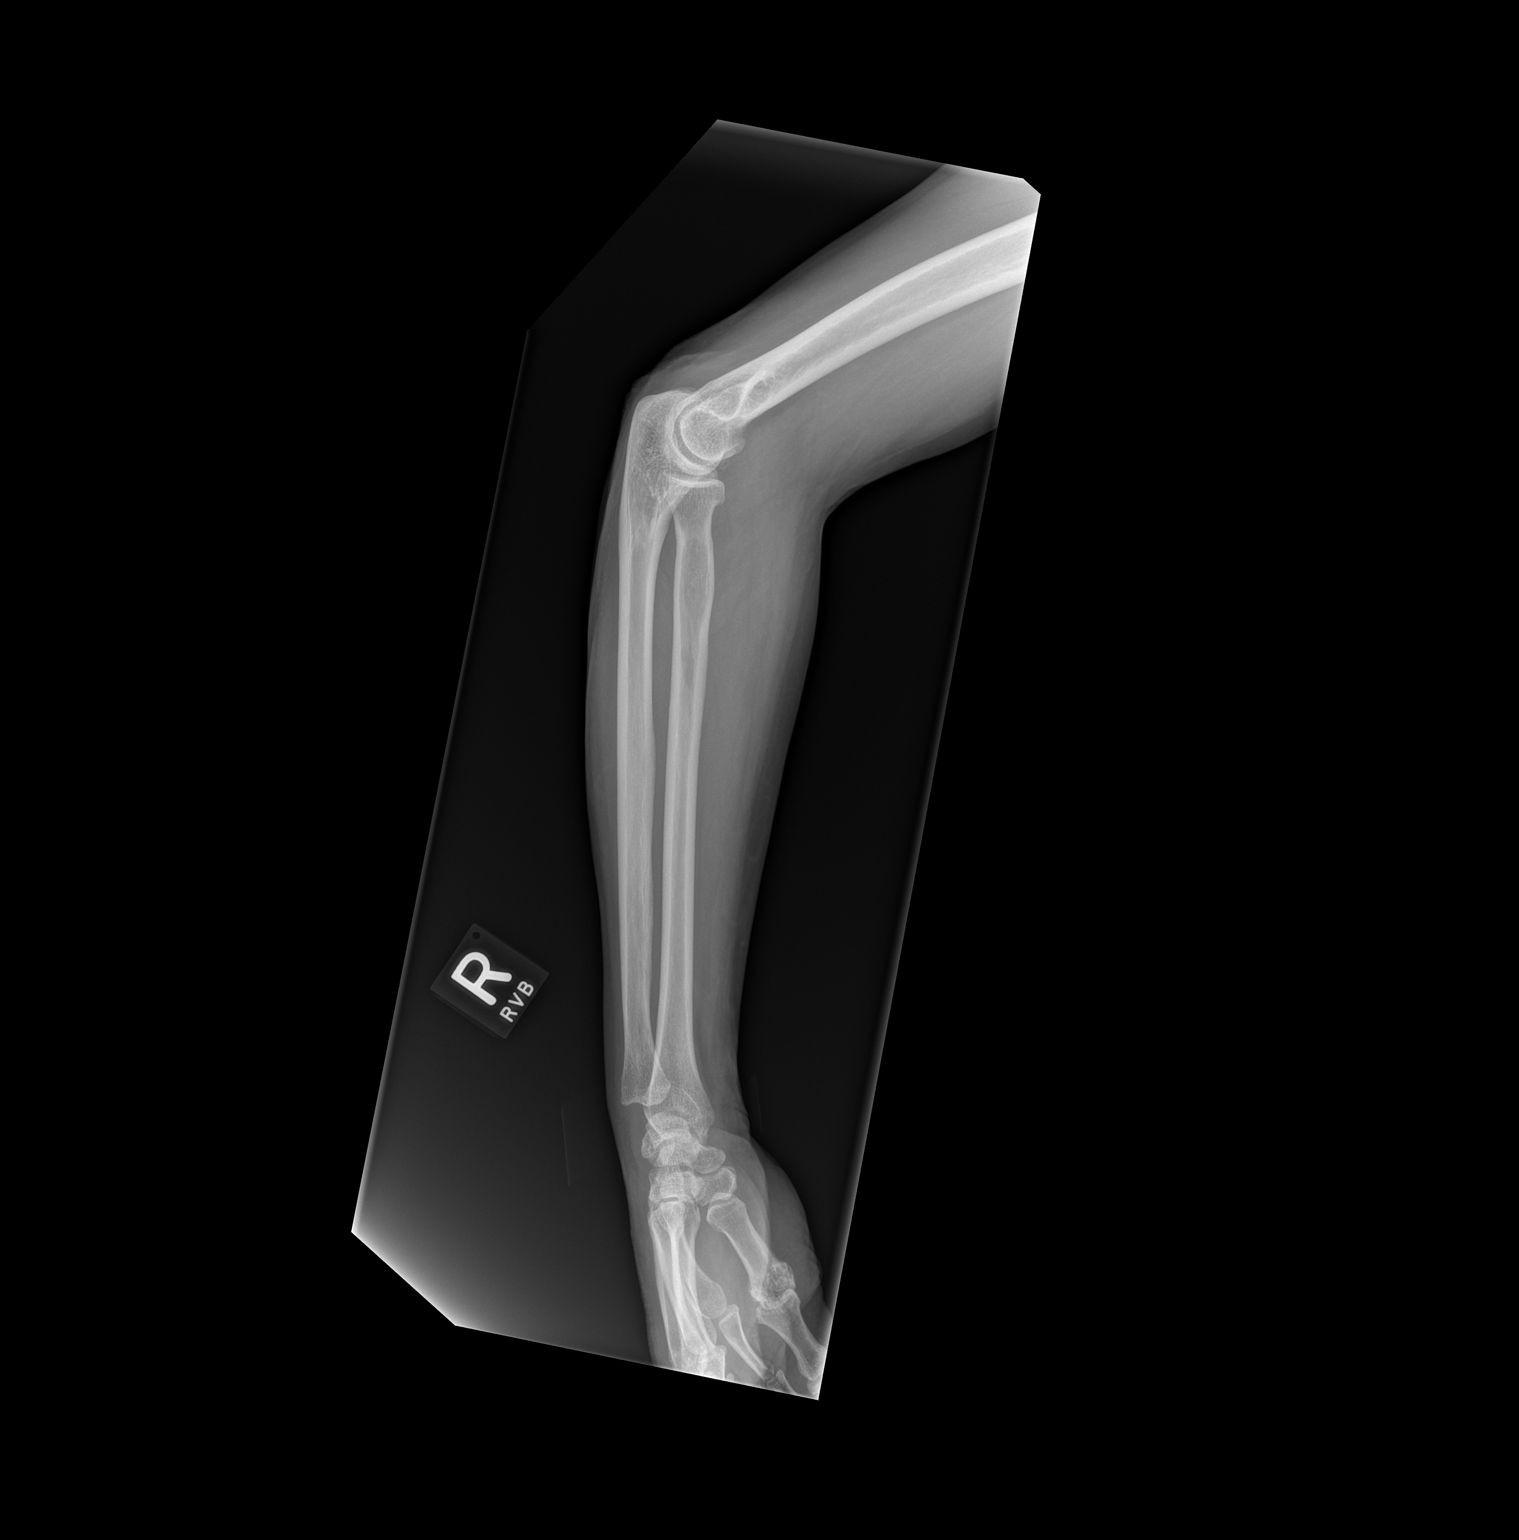

[x forearm ap right]
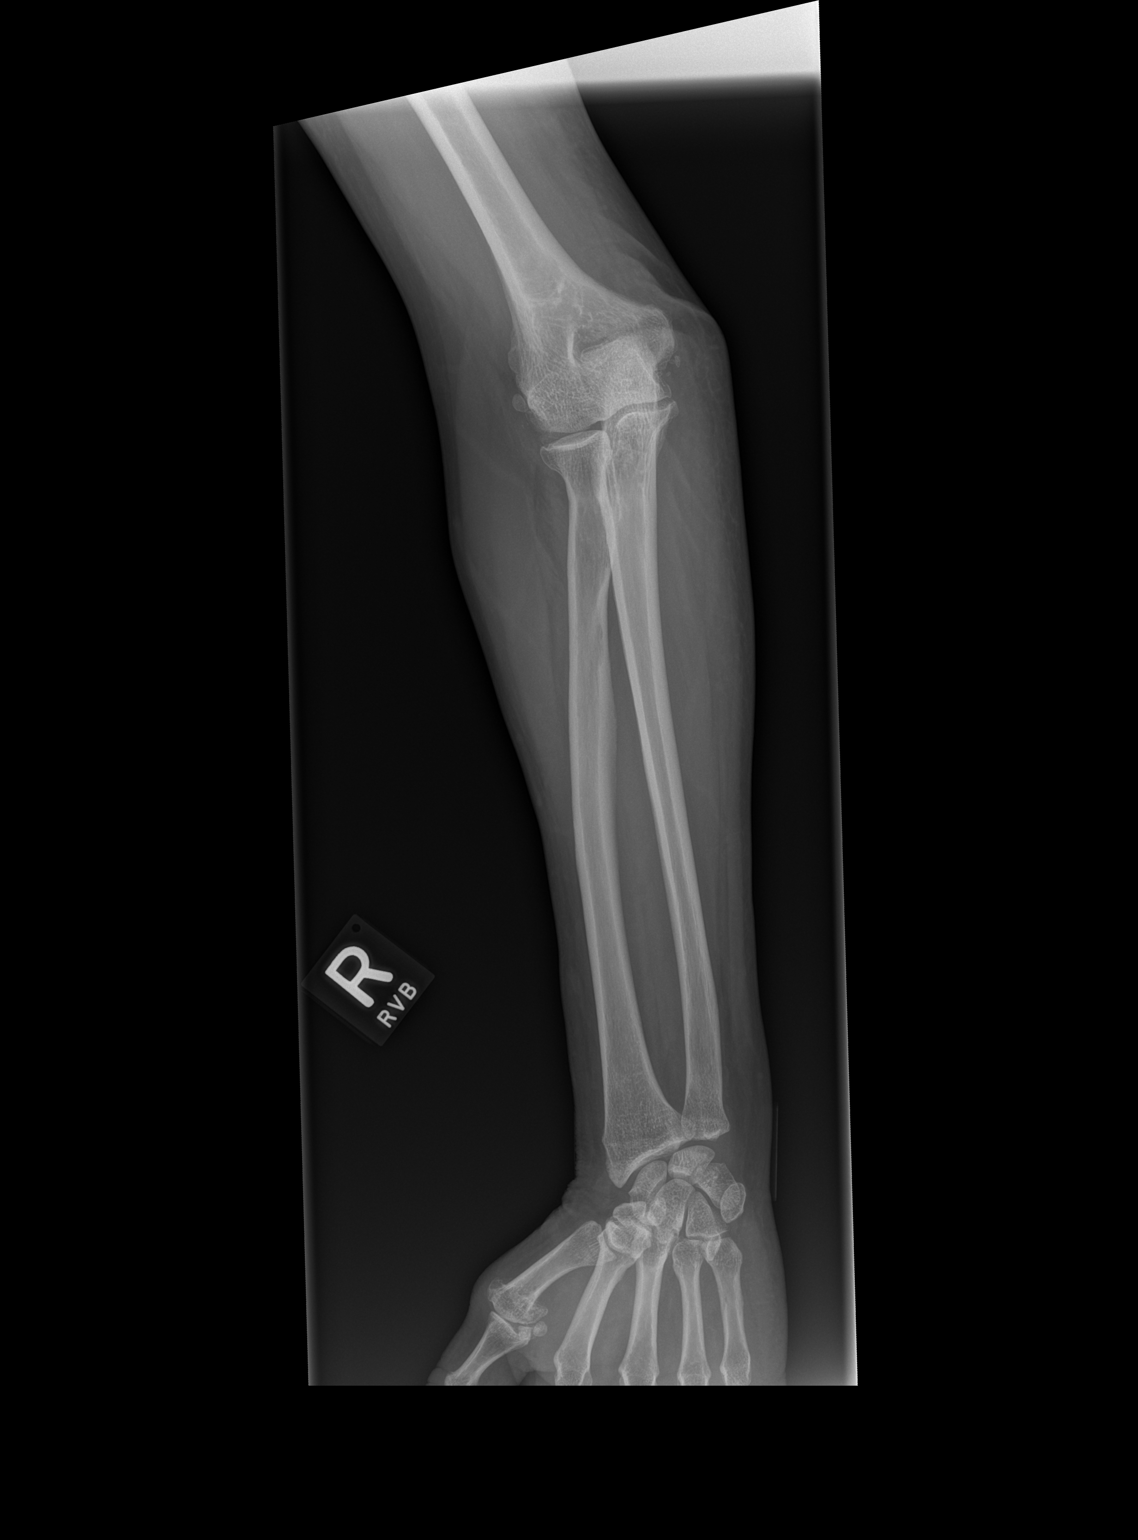

[2 of 2 positions shown; findings below may reference images not displayed]

FINDINGS: There is no evidence of fracture or other focal bone lesions. Soft
tissues are unremarkable.
IMPRESSION: Normal right forearm.

## 2018-01-10 MED ORDER — LIDOCAINE 5 % EX PTCH: 1.0000 | MEDICATED_PATCH | CUTANEOUS | 0 refills | Status: DC

## 2018-01-10 MED ORDER — CYCLOBENZAPRINE HCL 5 MG PO TABS: 5.0000 mg | ORAL_TABLET | Freq: Two times a day (BID) | ORAL | 0 refills | Status: AC | PRN

## 2018-01-10 NOTE — ED Provider Notes (Signed)
[facility]-EMERGENCY DEPT Provider Note   CSN: [id number] Arrival date & time: 01/10/18  1451     History   Chief Complaint Chief Complaint  Patient presents with  . Shoulder Pain  . arm bruising    HPI Katrina Schmidt is a 57 y.o. female.  HPI   Patient is a 57 year old female with a history of anxiety, chronic bronchitis, DDD, hyperlipidemia, hypertension, sciatica who presents emergency department today for evaluation of right shoulder and right arm pain that began about 2 weeks ago.  Patient states right shoulder pain is constant and worse with movement.  She feels a grinding sensation in her right shoulder when she moves it.  States pain radiates down her right upper extremity.  She also notes that several days ago she woke up and had bruising to her right forearm and just above her right elbow.  Denies any known trauma or falls, states she has a history of easy bruising.  She further states that she is on disability and spends several hours on her cell phone each day while laying on her right side and right arm and she wonders if this may have contributed to her symptoms.  She has tried salon pause and Tylenol with mild relief. No CP or SOB    Past Medical History:  Diagnosis Date  . Anxiety   . Chronic bronchitis (HCC)   . DDD (degenerative disc disease), lumbosacral   . DJD (degenerative joint disease)   . Hyperlipidemia   . Hypertension   . Sciatica     There are no active problems to display for this patient.   Past Surgical History:  Procedure Laterality Date  . HERNIA REPAIR       OB History   None      Home Medications    Prior to Admission medications   Medication Sig Start Date End Date Taking? Authorizing Provider  cyclobenzaprine (FLEXERIL) 5 MG tablet Take 1 tablet (5 mg total) by mouth 2 (two) times daily as needed for up to 5 days for muscle spasms. 01/10/18 01/15/18  Upton Russey S, PA-C  doxycycline (VIBRAMYCIN) 100  MG capsule Take 1 capsule (100 mg total) by mouth 2 (two) times daily. One po bid x 7 days 09/25/15   Street, Nord, PA-C  famotidine (PEPCID) 20 MG tablet Take 1 tablet (20 mg total) by mouth 2 (two) times daily. 05/29/16 06/01/16  Joy, Hillard Danker, PA-C  HYDROcodone-acetaminophen (NORCO) 5-325 MG tablet Take 1 tablet by mouth every 6 (six) hours as needed for severe pain. 09/25/15   Street, Mercedes, PA-C  lidocaine (LIDODERM) 5 % Place 1 patch onto the skin daily. Remove & Discard patch within 12 hours or as directed by MD 01/10/18   Taygen Acklin S, PA-C  naproxen (NAPROSYN) 500 MG tablet Take 1 tablet (500 mg total) by mouth 2 (two) times daily as needed for mild pain, moderate pain or headache (TAKE WITH MEALS.). 09/25/15   Street, Jackson, PA-C  triamcinolone cream (KENALOG) 0.1 % Apply 1 application topically 2 (two) times daily. 09/23/16   Jeanie Sewer, PA-C    Family History Family History  Problem Relation Age of Onset  . Hypertension Mother     Social History Social History   Tobacco Use  . Smoking status: Current Every Day Smoker    Packs/day: 0.50    Types: Cigarettes  . Smokeless tobacco: Never Used  Substance Use Topics  . Alcohol use: Yes    Comment:  Drinks 60oz beer daily  . Drug use: No     Allergies   Other   Review of Systems Review of Systems  Constitutional: Negative for chills and fever.  Respiratory: Negative for shortness of breath.   Cardiovascular: Negative for chest pain.  Musculoskeletal:       RUE pain  Skin: Positive for color change.  Hematological: Bruises/bleeds easily.     Physical Exam Updated Vital Signs BP (!) 185/106 (BP Location: Left Arm)   Pulse 84   Temp 98.4 F (36.9 C) (Oral)   Resp 12   Ht 5' 4.5" (1.638 m)   Wt 70.8 kg   SpO2 97%   BMI 26.36 kg/m   Physical Exam  Constitutional: She appears well-developed and well-nourished. No distress.  HENT:  Head: Normocephalic and atraumatic.  Eyes: Conjunctivae are normal.   Neck: Neck supple.  Cardiovascular: Normal rate, regular rhythm and normal heart sounds.  Pulmonary/Chest: Effort normal and breath sounds normal. No respiratory distress. She has no wheezes.  Musculoskeletal: Normal range of motion.  TTP diffusely to the right shoulder, triceps, trapezius and right cervical paraspinous muscles. Also with TTP to the medial forearm with overlying ecchymosis throughout. No warmth or obvious signs of cellulitis. Distal radial and ulnar pulses are strong and symmetric. Negative empty can, crossover, and hawkins testing.  Neurological: She is alert.  Skin: Skin is warm and dry.  Psychiatric: She has a normal mood and affect.  Nursing note and vitals reviewed.  ED Treatments / Results  Labs (all labs ordered are listed, but only abnormal results are displayed) Labs Reviewed - No data to display  EKG None  Radiology Dg Shoulder Right  Result Date: 01/10/2018 CLINICAL DATA:  Chronic right shoulder pain without known injury. EXAM: RIGHT SHOULDER - 2+ VIEW COMPARISON:  None. FINDINGS: There is no evidence of fracture or dislocation. There is no evidence of arthropathy or other focal bone abnormality. Soft tissues are unremarkable. IMPRESSION: Normal right shoulder. Electronically Signed   By: Lupita Raider, M.D.   On: 01/10/2018 15:45   Dg Forearm Right  Result Date: 01/10/2018 CLINICAL DATA:  Chronic right forearm pain without known injury. EXAM: RIGHT FOREARM - 2 VIEW COMPARISON:  None. FINDINGS: There is no evidence of fracture or other focal bone lesions. Soft tissues are unremarkable. IMPRESSION: Normal right forearm. Electronically Signed   By: Lupita Raider, M.D.   On: 01/10/2018 15:47    Procedures Procedures (including critical care time)  Medications Ordered in ED Medications - No data to display   Initial Impression / Assessment and Plan / ED Course  I have reviewed the triage vital signs and the nursing notes.  Pertinent labs & imaging  results that were available during my care of the patient were reviewed by me and considered in my medical decision making (see chart for details).     Final Clinical Impressions(s) / ED Diagnoses   Final diagnoses:  Right arm pain   Pt with RUE pain and some bruising noted to RUE. Pt reports h/o easy bruising. No recent trauma. No edema on exam. Xray right shoulder and forearm negative for acute bony abnormality. Ultrasound RUE negative for DVT or superficial venous thrombosis. Suspect muscle spasm likely due to pt reporting that she lays/holds herself up on the right side while looking at her phone for multiple hours daily. Suspect bruising is also secondary to spending multiple hours laying on her right arm daily. Advised continuation of tylenol and salon pas.  Will also give rx for robaxin. Advised f/u with her pcp as she may need hematologic testing for her easy bruising. Advised her to return to the ed if worsening or persistent sxs. All questions answered and pt understands plan and reasons to return.   Pt HTN today. She has h/o HTN and did not take meds today. She will do so when she gets home. She is asymptomatic, do not suspect HTN emergency.   ED Discharge Orders         Ordered    cyclobenzaprine (FLEXERIL) 5 MG tablet  2 times daily PRN     01/10/18 1731    lidocaine (LIDODERM) 5 %  Every 24 hours     01/10/18 1731           Takeyah Wieman, [employer], PA-C 01/10/18 1734    Raeford Razor, MD 01/11/18 1202

## 2018-01-10 NOTE — ED Triage Notes (Signed)
Patient reports that she has had right shoulder pain x 2 weeks that have gotten progressively worse. Patient also reports bruising to the right arm from elbow to the right forearm x 3 days.

## 2018-01-10 NOTE — Progress Notes (Signed)
*  Preliminary Results* Right upper extremity venous duplex completed. Right upper extremity is negative for deep and superficial vein thrombosis.  01/10/2018 4:31 PM Katrina Schmidt

## 2018-01-10 NOTE — Discharge Instructions (Addendum)
Please usee medications as directed on your discharge paperwork.    Prescription given for flexeril. Take medication as directed and do not operate machinery, drive a car, or work while taking this medication as it can make you drowsy.   Please make an appointment to follow-up with your regular doctor next week for reevaluation.  Return to the ER for any new or worsening symptoms in the time.

## 2019-01-16 ENCOUNTER — Emergency Department (HOSPITAL_COMMUNITY): Payer: Medicaid Other

## 2019-01-16 ENCOUNTER — Encounter (HOSPITAL_COMMUNITY): Payer: Self-pay

## 2019-01-16 ENCOUNTER — Other Ambulatory Visit: Payer: Self-pay

## 2019-01-16 ENCOUNTER — Inpatient Hospital Stay (HOSPITAL_COMMUNITY)
Admission: EM | Admit: 2019-01-16 | Discharge: 2019-01-18 | DRG: 065 | Disposition: A | Discharge status: Home / Self Care | Payer: Medicaid Other | Attending: Family Medicine | Admitting: Family Medicine

## 2019-01-16 ENCOUNTER — Inpatient Hospital Stay (HOSPITAL_COMMUNITY): Payer: Medicaid Other

## 2019-01-16 DIAGNOSIS — Z7982 Long term (current) use of aspirin: Secondary | ICD-10-CM | POA: Diagnosis not present

## 2019-01-16 DIAGNOSIS — F17210 Nicotine dependence, cigarettes, uncomplicated: Secondary | ICD-10-CM | POA: Diagnosis present

## 2019-01-16 DIAGNOSIS — Z79891 Long term (current) use of opiate analgesic: Secondary | ICD-10-CM | POA: Diagnosis not present

## 2019-01-16 DIAGNOSIS — I639 Cerebral infarction, unspecified: Principal | ICD-10-CM | POA: Diagnosis present

## 2019-01-16 DIAGNOSIS — F419 Anxiety disorder, unspecified: Secondary | ICD-10-CM | POA: Diagnosis present

## 2019-01-16 DIAGNOSIS — Z8249 Family history of ischemic heart disease and other diseases of the circulatory system: Secondary | ICD-10-CM | POA: Diagnosis not present

## 2019-01-16 DIAGNOSIS — M5430 Sciatica, unspecified side: Secondary | ICD-10-CM | POA: Diagnosis present

## 2019-01-16 DIAGNOSIS — M5137 Other intervertebral disc degeneration, lumbosacral region: Secondary | ICD-10-CM | POA: Diagnosis present

## 2019-01-16 DIAGNOSIS — I63312 Cerebral infarction due to thrombosis of left middle cerebral artery: Secondary | ICD-10-CM

## 2019-01-16 DIAGNOSIS — R29810 Facial weakness: Secondary | ICD-10-CM | POA: Diagnosis present

## 2019-01-16 DIAGNOSIS — I10 Essential (primary) hypertension: Secondary | ICD-10-CM | POA: Diagnosis present

## 2019-01-16 DIAGNOSIS — J42 Unspecified chronic bronchitis: Secondary | ICD-10-CM | POA: Diagnosis present

## 2019-01-16 DIAGNOSIS — F410 Panic disorder [episodic paroxysmal anxiety] without agoraphobia: Secondary | ICD-10-CM | POA: Diagnosis present

## 2019-01-16 DIAGNOSIS — F1020 Alcohol dependence, uncomplicated: Secondary | ICD-10-CM | POA: Diagnosis present

## 2019-01-16 DIAGNOSIS — Z79899 Other long term (current) drug therapy: Secondary | ICD-10-CM

## 2019-01-16 DIAGNOSIS — I6389 Other cerebral infarction: Secondary | ICD-10-CM | POA: Diagnosis not present

## 2019-01-16 DIAGNOSIS — E785 Hyperlipidemia, unspecified: Secondary | ICD-10-CM | POA: Diagnosis present

## 2019-01-16 DIAGNOSIS — R29705 NIHSS score 5: Secondary | ICD-10-CM | POA: Diagnosis present

## 2019-01-16 DIAGNOSIS — F1410 Cocaine abuse, uncomplicated: Secondary | ICD-10-CM | POA: Diagnosis present

## 2019-01-16 DIAGNOSIS — Z20828 Contact with and (suspected) exposure to other viral communicable diseases: Secondary | ICD-10-CM | POA: Diagnosis present

## 2019-01-16 DIAGNOSIS — M5432 Sciatica, left side: Secondary | ICD-10-CM

## 2019-01-16 DIAGNOSIS — Z720 Tobacco use: Secondary | ICD-10-CM | POA: Diagnosis present

## 2019-01-16 DIAGNOSIS — G8191 Hemiplegia, unspecified affecting right dominant side: Secondary | ICD-10-CM | POA: Diagnosis present

## 2019-01-16 DIAGNOSIS — M5431 Sciatica, right side: Secondary | ICD-10-CM

## 2019-01-16 DIAGNOSIS — F1010 Alcohol abuse, uncomplicated: Secondary | ICD-10-CM | POA: Diagnosis present

## 2019-01-16 DIAGNOSIS — E7849 Other hyperlipidemia: Secondary | ICD-10-CM | POA: Diagnosis not present

## 2019-01-16 DIAGNOSIS — M1990 Unspecified osteoarthritis, unspecified site: Secondary | ICD-10-CM | POA: Diagnosis present

## 2019-01-16 LAB — DIFFERENTIAL
Abs Immature Granulocytes: 0.02 10*3/uL (ref 0.00–0.07)
Basophils Absolute: 0.1 10*3/uL (ref 0.0–0.1)
Basophils Relative: 1 %
Eosinophils Absolute: 0.1 10*3/uL (ref 0.0–0.5)
Eosinophils Relative: 2 %
Immature Granulocytes: 0 %
Lymphocytes Relative: 26 %
Lymphs Abs: 1.9 10*3/uL (ref 0.7–4.0)
Monocytes Absolute: 0.6 10*3/uL (ref 0.1–1.0)
Monocytes Relative: 9 %
Neutro Abs: 4.6 10*3/uL (ref 1.7–7.7)
Neutrophils Relative %: 62 %

## 2019-01-16 LAB — CBC
HCT: 46 % (ref 36.0–46.0)
HCT: 45.3 % (ref 36.0–46.0)
Hemoglobin: 15.6 g/dL — ABNORMAL HIGH (ref 12.0–15.0)
Hemoglobin: 15.1 g/dL — ABNORMAL HIGH (ref 12.0–15.0)
MCH: 32.5 pg (ref 26.0–34.0)
MCH: 32.3 pg (ref 26.0–34.0)
MCHC: 33.9 g/dL (ref 30.0–36.0)
MCHC: 33.3 g/dL (ref 30.0–36.0)
MCV: 95.8 fL (ref 80.0–100.0)
MCV: 97 fL (ref 80.0–100.0)
Platelets: 339 10*3/uL (ref 150–400)
Platelets: 348 10*3/uL (ref 150–400)
RBC: 4.8 MIL/uL (ref 3.87–5.11)
RBC: 4.67 MIL/uL (ref 3.87–5.11)
RDW: 13.6 % (ref 11.5–15.5)
RDW: 13.6 % (ref 11.5–15.5)
WBC: 7.4 10*3/uL (ref 4.0–10.5)
WBC: 6.5 10*3/uL (ref 4.0–10.5)
nRBC: 0 % (ref 0.0–0.2)
nRBC: 0 % (ref 0.0–0.2)

## 2019-01-16 LAB — COMPREHENSIVE METABOLIC PANEL
ALT: 25 U/L (ref 0–44)
AST: 22 U/L (ref 15–41)
Albumin: 3.8 g/dL (ref 3.5–5.0)
Alkaline Phosphatase: 64 U/L (ref 38–126)
Anion gap: 11 (ref 5–15)
BUN: 8 mg/dL (ref 6–20)
CO2: 24 mmol/L (ref 22–32)
Calcium: 9 mg/dL (ref 8.9–10.3)
Chloride: 105 mmol/L (ref 98–111)
Creatinine, Ser: 0.68 mg/dL (ref 0.44–1.00)
GFR calc Af Amer: >60 mL/min (ref >60)
GFR calc non Af Amer: >60 mL/min (ref >60)
Glucose, Bld: 101 mg/dL — ABNORMAL HIGH (ref 70–99)
Potassium: 4 mmol/L (ref 3.5–5.1)
Sodium: 140 mmol/L (ref 135–145)
Total Bilirubin: 0.7 mg/dL (ref 0.3–1.2)
Total Protein: 6.5 g/dL (ref 6.5–8.1)

## 2019-01-16 LAB — I-STAT CHEM 8, ED
BUN: 9 mg/dL (ref 6–20)
Calcium, Ion: 1.18 mmol/L (ref 1.15–1.40)
Chloride: 107 mmol/L (ref 98–111)
Creatinine, Ser: 0.5 mg/dL (ref 0.44–1.00)
Glucose, Bld: 98 mg/dL (ref 70–99)
HCT: 45 % (ref 36.0–46.0)
Hemoglobin: 15.3 g/dL — ABNORMAL HIGH (ref 12.0–15.0)
Potassium: 3.8 mmol/L (ref 3.5–5.1)
Sodium: 141 mmol/L (ref 135–145)
TCO2: 24 mmol/L (ref 22–32)

## 2019-01-16 LAB — PROTIME-INR
INR: 1.1 (ref 0.8–1.2)
Prothrombin Time: 13.6 seconds (ref 11.4–15.2)

## 2019-01-16 LAB — ETHANOL: Alcohol, Ethyl (B): <10 mg/dL (ref <10)

## 2019-01-16 LAB — CREATININE, SERUM
Creatinine, Ser: 0.83 mg/dL (ref 0.44–1.00)
GFR calc Af Amer: >60 mL/min (ref >60)
GFR calc non Af Amer: >60 mL/min (ref >60)

## 2019-01-16 LAB — APTT: aPTT: 25 seconds (ref 24–36)

## 2019-01-16 LAB — CBG MONITORING, ED: Glucose-Capillary: 105 mg/dL — ABNORMAL HIGH (ref 70–99)

## 2019-01-16 LAB — I-STAT BETA HCG BLOOD, ED (MC, WL, AP ONLY): I-stat hCG, quantitative: <5 m[IU]/mL (ref <5)

## 2019-01-16 LAB — HIV ANTIBODY (ROUTINE TESTING W REFLEX): HIV Screen 4th Generation wRfx: NONREACTIVE

## 2019-01-16 IMAGING — MR MR HEAD W/O CM
11 of 13 series · 39 of 48 positions shown · non-contrast
Comparison: CT head [DATE]

CLINICAL DATA: Focal neuro deficit greater than 6 hours

EXAM:
MRI HEAD WITHOUT CONTRAST
TECHNIQUE: Multiplanar, multiecho pulse sequences of the brain and surrounding
structures were obtained without intravenous contrast.

[Series 2: DWI · axial · 3.0mm · 0.94mm/px · z∈[-165,-5]mm · 6 of 112 slices shown (1 of 4)]
[im 1/112]
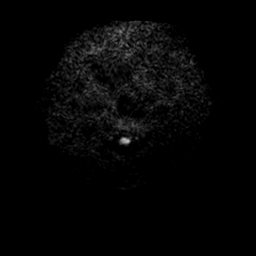
[im 23/112]
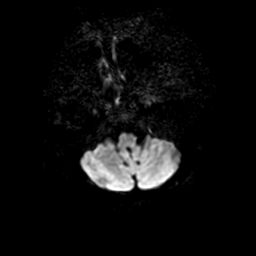
[im 45/112]
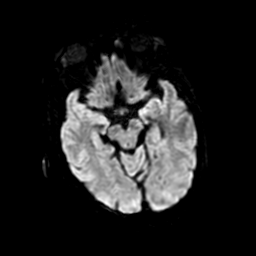
[im 67/112]
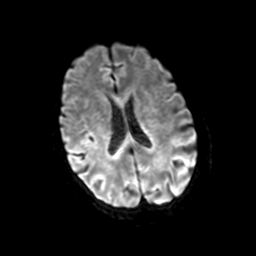
[im 89/112]
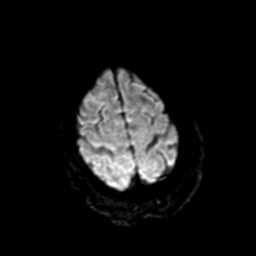
[im 112/112]
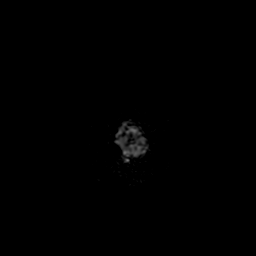

[Series 4: FLAIR · sagittal · 5.0mm · 0.47mm/px · 1 of 20 slices shown (1 of 2)]
[im 1/20]
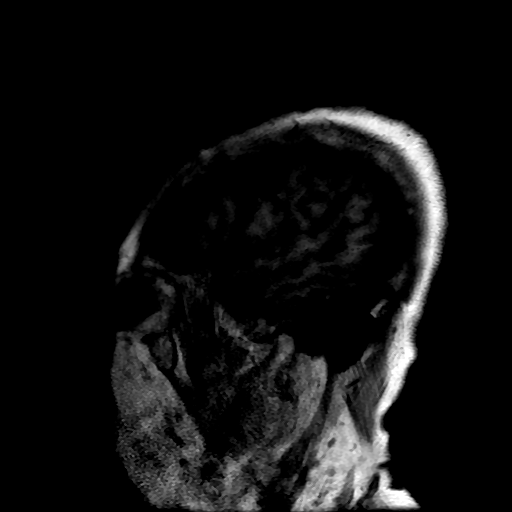

[Series 5: T2 · axial · 5.0mm · 0.47mm/px · z∈[-164,-13]mm · 2 of 27 slices shown (1 of 2)]
[im 1/27]
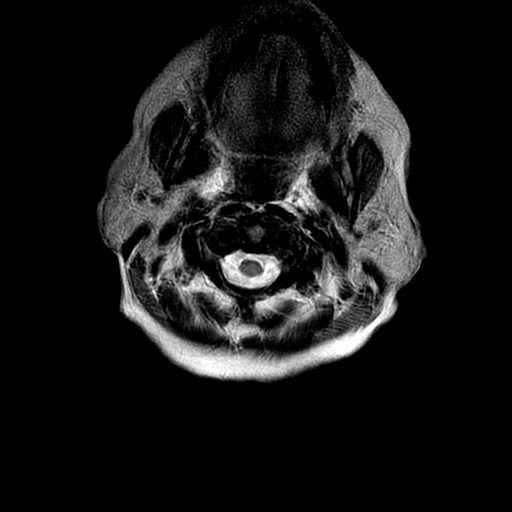
[im 27/27]
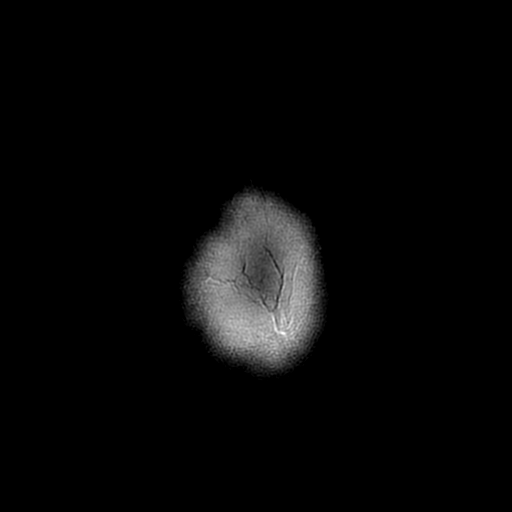

[Series 6: DWI · coronal · 4.0mm · 0.94mm/px · 5 of 76 slices shown (2 of 4)]
[im 1/76]
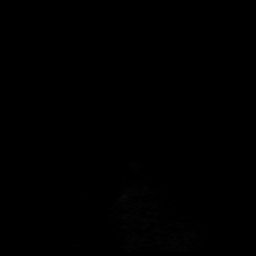
[im 19/76]
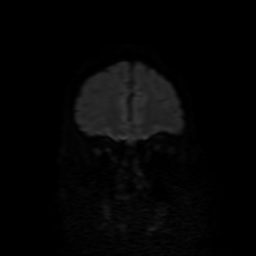
[im 38/76]
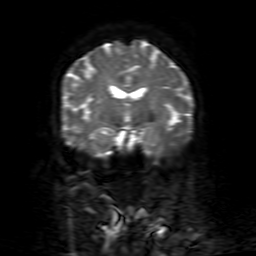
[im 57/76]
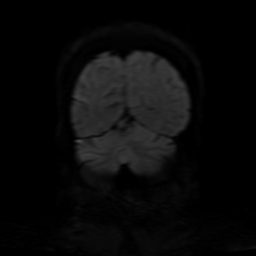
[im 76/76]
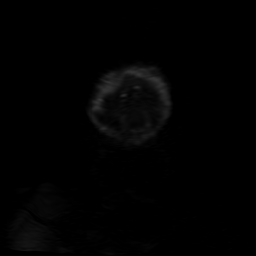

[Series 7: FLAIR · axial · 3.0mm · 0.47mm/px · z∈[-164,-13]mm · 2 of 27 slices shown (2 of 2)]
[im 1/27]
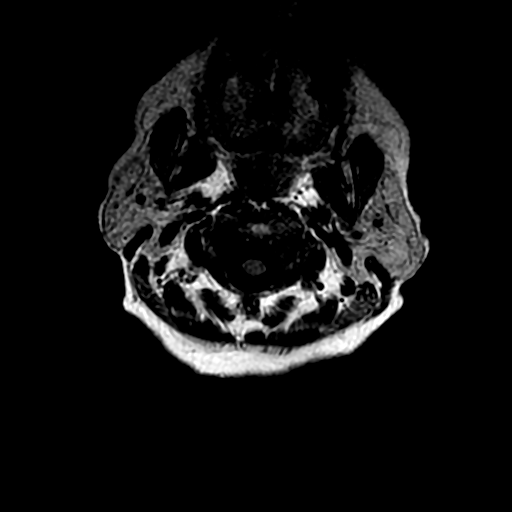
[im 27/27]
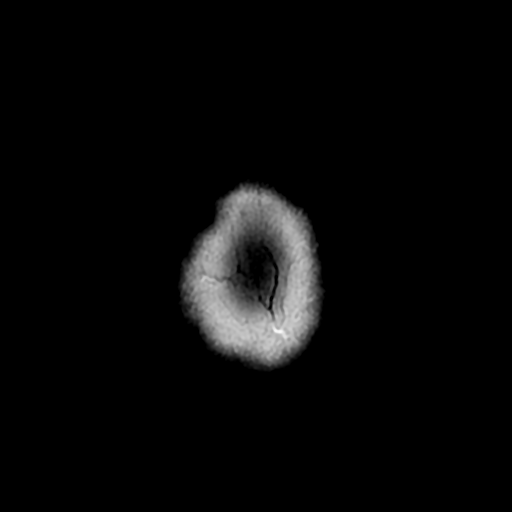

[Series 8: SWI · axial · 3.0mm · 0.47mm/px · z∈[-149,-64]mm · 4 of 100 slices shown]
[im 1/100]
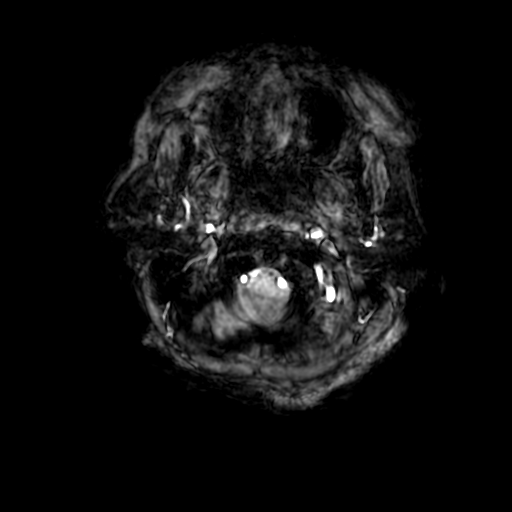
[im 20/100]
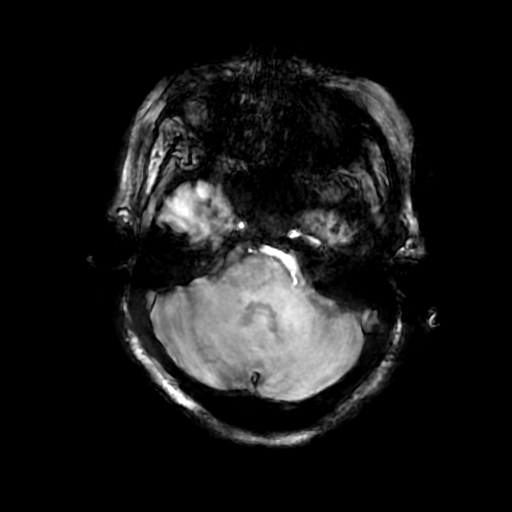
[im 40/100]
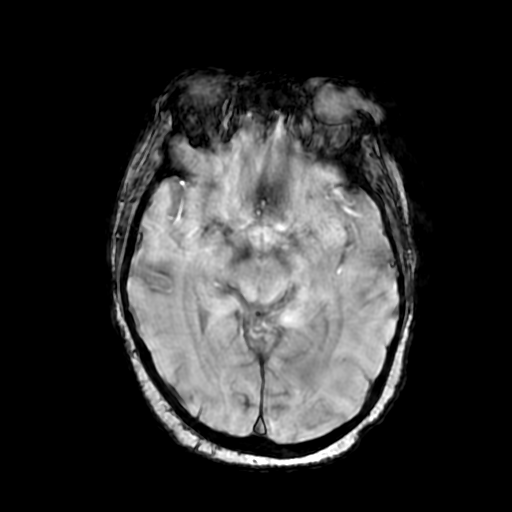
[im 60/100]
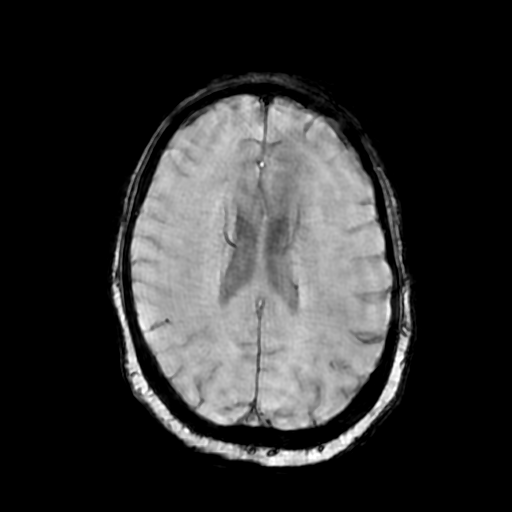

[Series 12: T2 · coronal · 5.0mm · 0.39mm/px · 2 of 27 slices shown (2 of 2)]
[im 1/27]
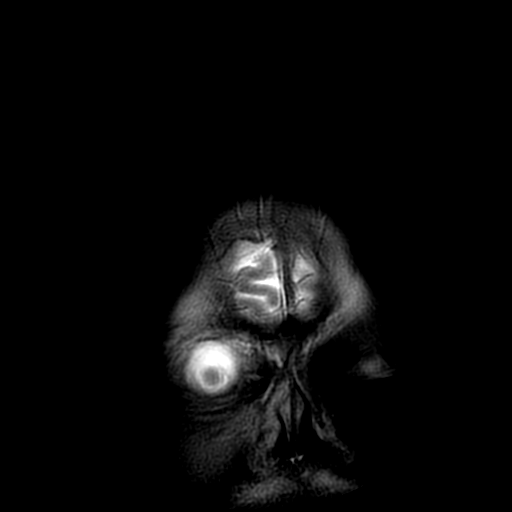
[im 27/27]
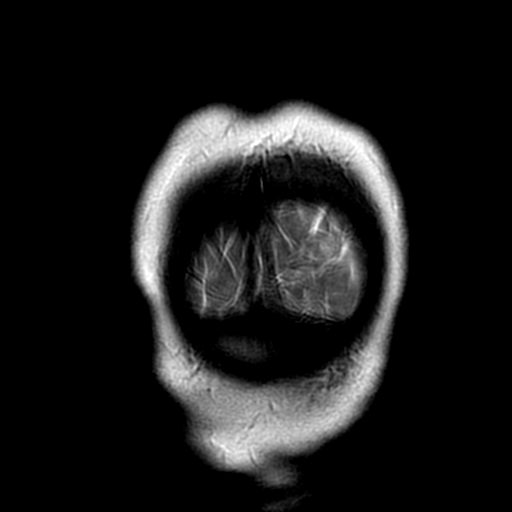

[Series 210: DWI · axial · 3.0mm · 0.94mm/px · z∈[-165,-5]mm · 7 of 112 slices shown (3 of 4)]
[im 1/112]
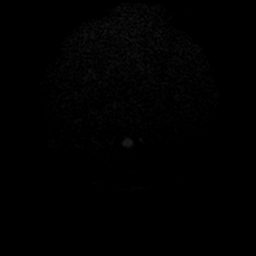
[im 19/112]
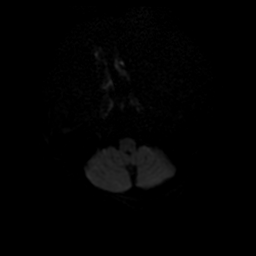
[im 38/112]
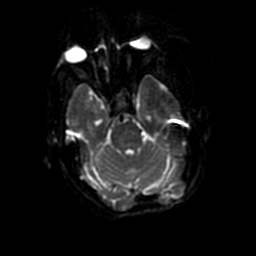
[im 56/112]
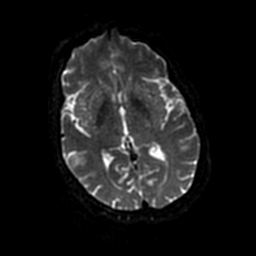
[im 75/112]
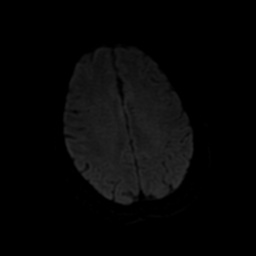
[im 93/112]
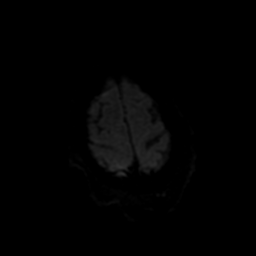
[im 112/112]
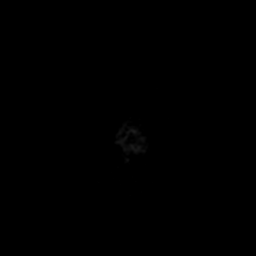

[Series 250: ADC · axial · 3.0mm · 0.94mm/px · z∈[-165,-5]mm · 3 of 56 slices shown (1 of 2)]
[im 1/56]
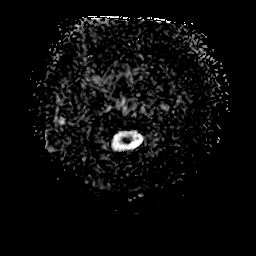
[im 28/56]
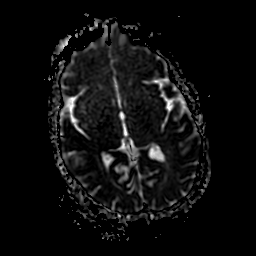
[im 56/56]
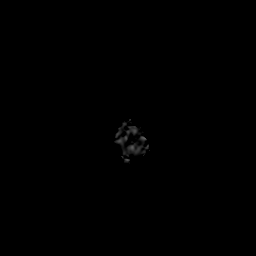

[Series 610: DWI · coronal · 4.0mm · 0.94mm/px · 5 of 76 slices shown (4 of 4)]
[im 1/76]
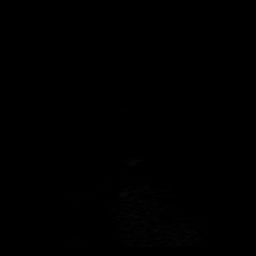
[im 19/76]
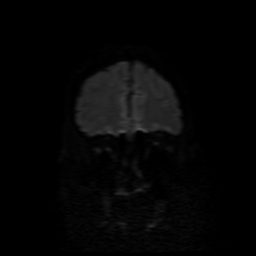
[im 38/76]
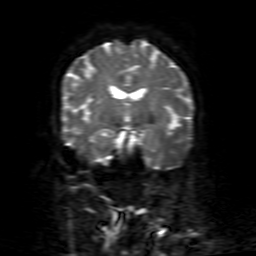
[im 57/76]
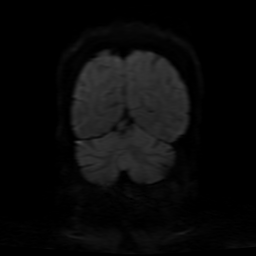
[im 76/76]
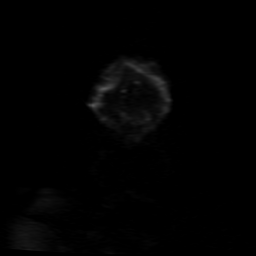

[Series 650: ADC · coronal · 4.0mm · 0.94mm/px · 2 of 38 slices shown (2 of 2)]
[im 1/38]
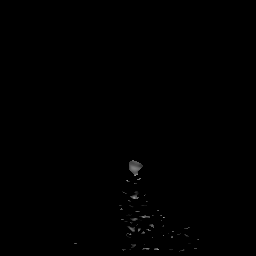
[im 38/38]
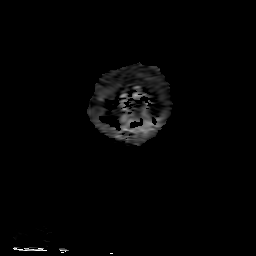

[39 of 48 positions shown; findings below may reference images not displayed]

FINDINGS: Brain: Small area of acute infarct in the posterior limb internal
capsule on the left. No other acute infarct.

Patchy white matter disease bilaterally. Mild patchy hyperintensity
in the pons. Negative for hemorrhage or mass. Ventricle size and
cerebral volume normal.

Vascular: Normal arterial flow voids

Skull and upper cervical spine: Negative

Sinuses/Orbits: Mild mucosal edema paranasal sinuses.  Normal orbit

Other: None
IMPRESSION: Small acute infarct posterior limb internal capsule on the left

Mild chronic microvascular ischemic change in the white matter and
pons.

## 2019-01-16 IMAGING — CT CT CERVICAL SPINE W/O CM
5 of 8 series · 13 of 33 positions shown, 14 images · non-contrast
Comparison: None.

CLINICAL DATA: Pain following fall.  Lower extremity weakness

EXAM:
CT HEAD WITHOUT CONTRAST
CT CERVICAL SPINE WITHOUT CONTRAST
TECHNIQUE: Multidetector CT imaging of the head and cervical spine was
performed following the standard protocol without intravenous
contrast. Multiplanar CT image reconstructions of the cervical spine
were also generated.

[Series 5: head bone · axial · 0.43mm/px · z∈[+1184,+1240]mm · 2 of 85 slices shown]
[im 29/85  bone]
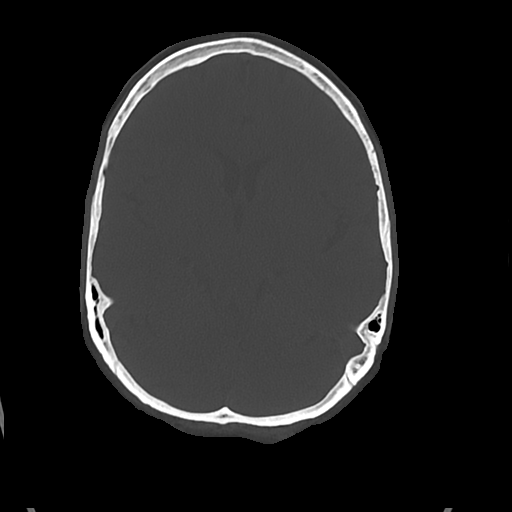
[im 57/85  bone]
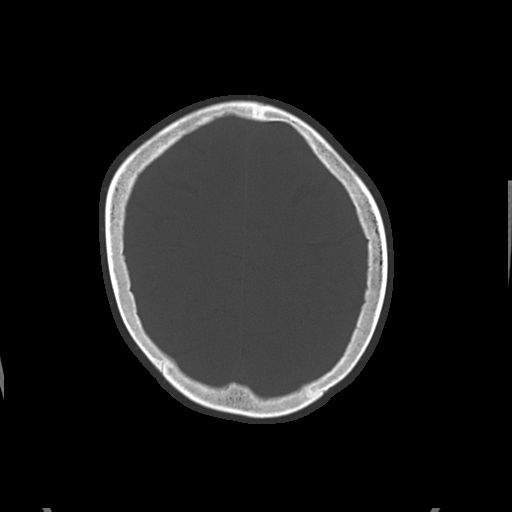

[Series 9: c spine soft · axial · 0.27mm/px · z∈[+1054,+1108]mm · 2 of 81 slices shown]
[im 27/81  soft-tissue]
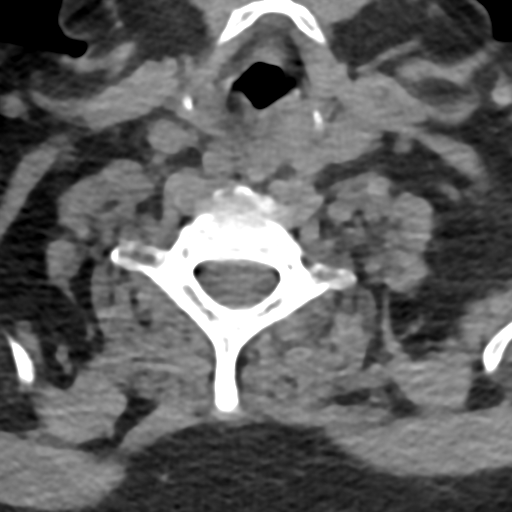
[im 54/81  soft-tissue]
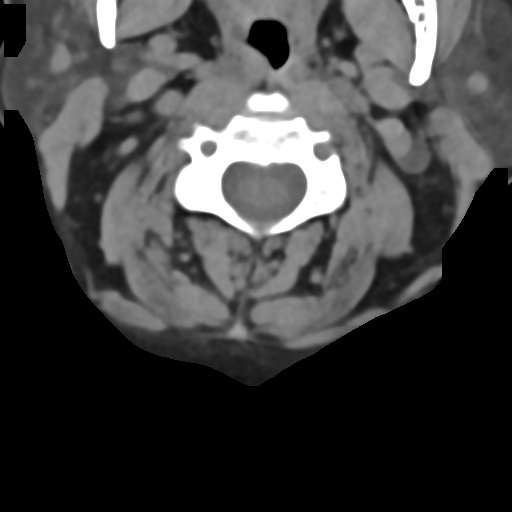

[Series 12: sag bone · sagittal · 0.28mm/px · 5 of 66 slices shown]
[im 11/66  bone]
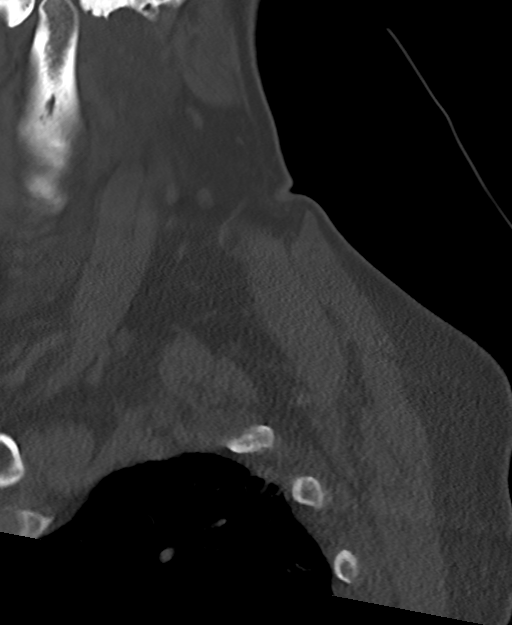
[im 22/66  bone]
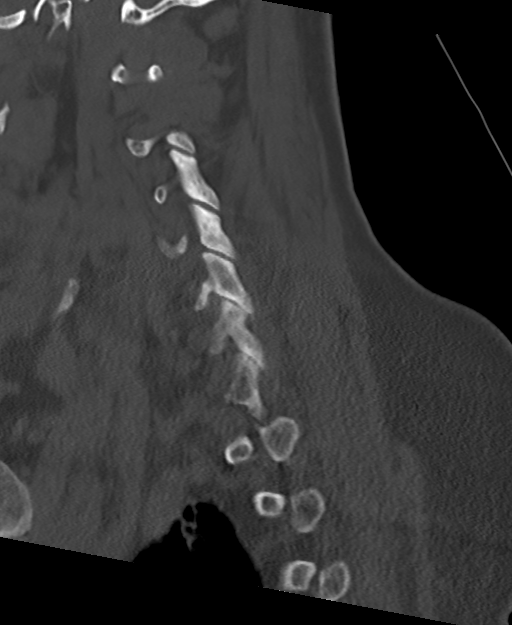
[im 33/66  bone]
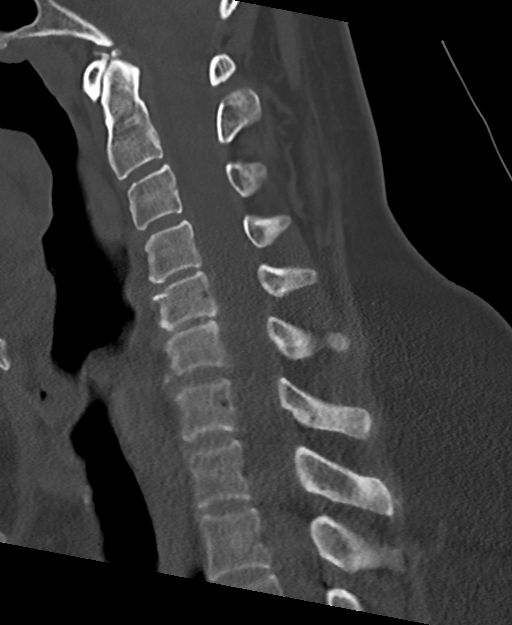
[im 44/66  bone]
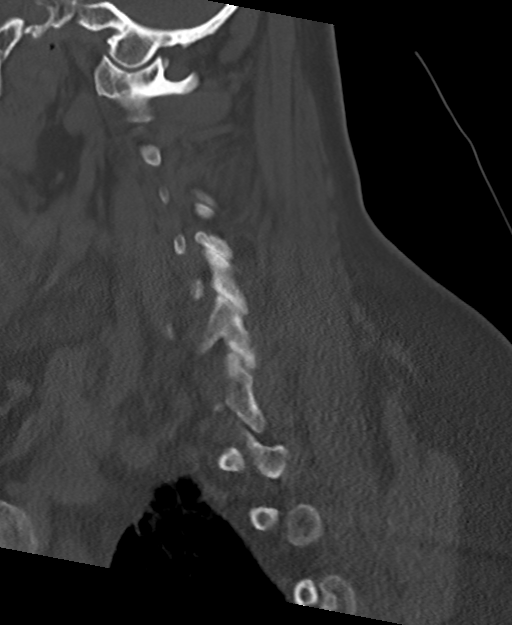
[im 55/66  bone]
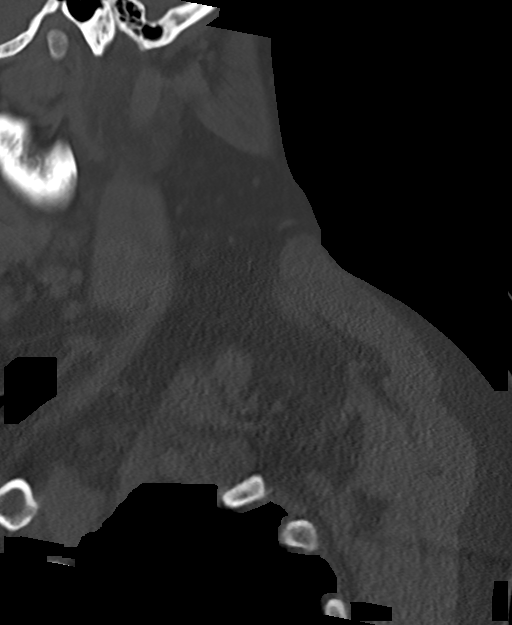

[Series 13: cor bone · coronal · 0.26mm/px · 2 of 51 slices shown]
[im 17/51  bone]
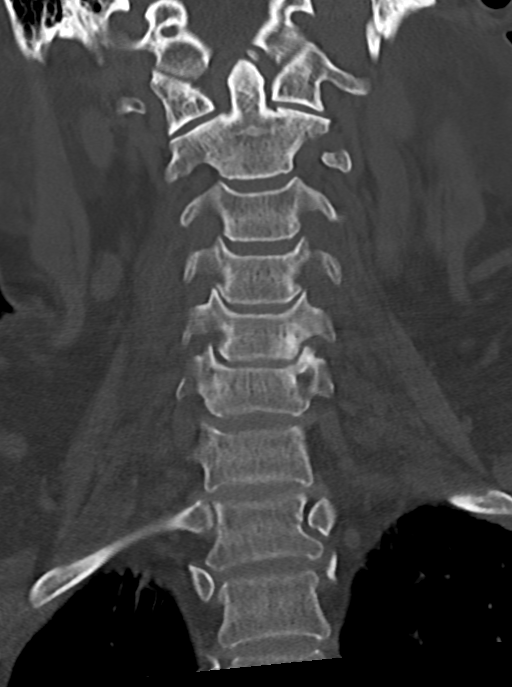
[im 34/51  bone]
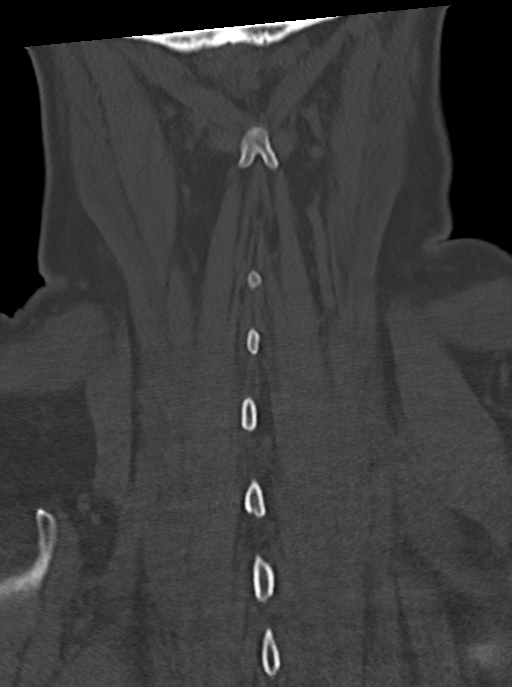

[Series 14: orthogonal axials · axial · 0.21mm/px · z∈[+1047,+1093]mm · 2 of 88 slices shown, 3 images]
[im 30/88  soft-tissue]
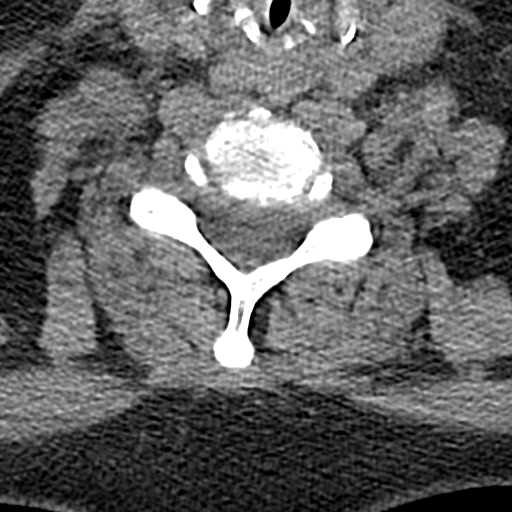
[im 30/88  bone]
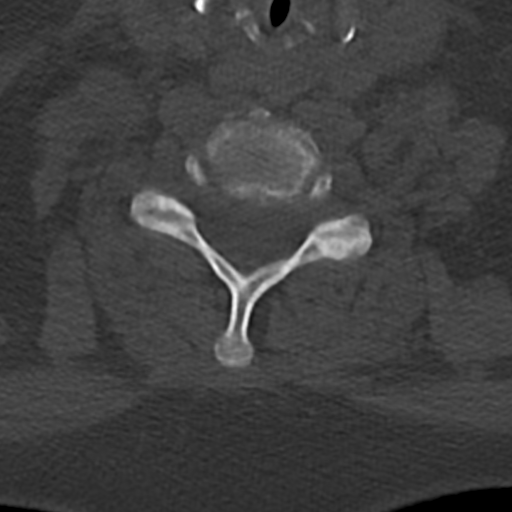
[im 59/88  bone]
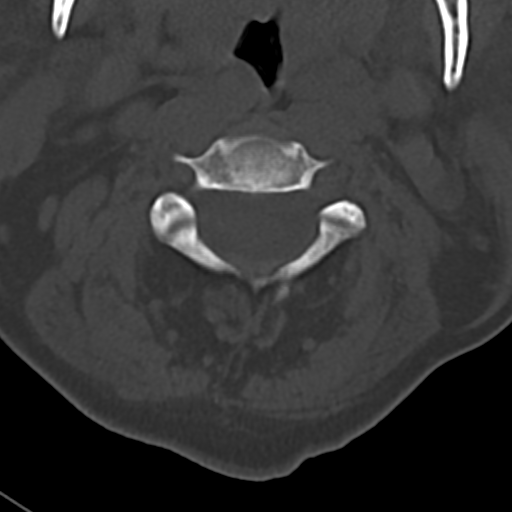

[13 of 33 positions shown; findings below may reference images not displayed]

FINDINGS: CT HEAD FINDINGS

Brain: Ventricles are normal in size and configuration. There is no
intracranial mass, hemorrhage, extra-axial fluid collection, or
midline shift. The brain parenchyma appears unremarkable. No evident
acute infarct.

Vascular: There is no hyperdense vessel. No appreciable vascular
calcification.

Skull: Bony calvarium appears intact.

Sinuses/Orbits: Visualized paranasal sinuses are clear. Visualized
orbits appear symmetric bilaterally.

Other: Mastoid air cells are clear.

CT CERVICAL SPINE FINDINGS

Alignment: There is no demonstrable spondylolisthesis.

Skull base and vertebrae: Skull base and craniocervical junction
regions appear normal. No fracture evident. There are no blastic or
lytic bone lesions.

Soft tissues and spinal canal: Prevertebral soft tissues and
predental space regions are normal. No cord or canal hematoma. No
paraspinous lesions.

Disc levels: There is moderate disc space narrowing at C5-6 and to a
slightly lesser extent at C4-5. Other disc spaces appear
unremarkable. There is facet hypertrophy at C4-5 and C5-6. No disc
extrusion or stenosis. No appreciable nerve root edema or
effacement.

Upper chest: There is scarring in the apices, slightly more on the
right than on the left. Visualized upper lung regions are clear.

Other: None
IMPRESSION: CT head: Study within normal limits.

CT cervical spine: Areas of osteoarthritic change, primarily at C4-5
and C5-6. No fracture or spondylolisthesis. Apical lung scarring
noted.

## 2019-01-16 IMAGING — CT CT ANGIO HEAD
2 of 7 series · 8 of 33 positions shown · IV contrast (omnipaque)
Comparison: Brain MRI [DATE], head CT [DATE]

CLINICAL DATA: Stroke, follow-up.

EXAM:
CT ANGIOGRAPHY HEAD AND NECK
TECHNIQUE: Multidetector CT imaging of the head and neck was performed using
the standard protocol during bolus administration of intravenous
contrast. Multiplanar CT image reconstructions and MIPs were
obtained to evaluate the vascular anatomy. Carotid stenosis
measurements (when applicable) are obtained utilizing NASCET
criteria, using the distal internal carotid diameter as the
denominator.
CONTRAST:  75 mL OMNIPAQUE IOHEXOL 350 MG/ML SOLN

[Series 5: cta neck · axial · 0.42mm/px · z∈[-280,-156]mm · 2 of 186 slices shown]
[im 62/186  soft-tissue]
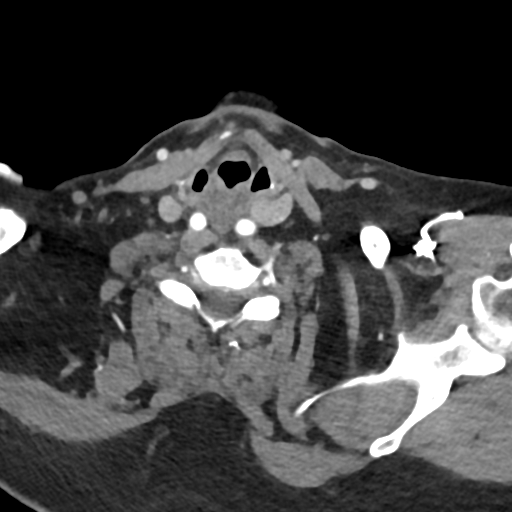
[im 124/186  soft-tissue]
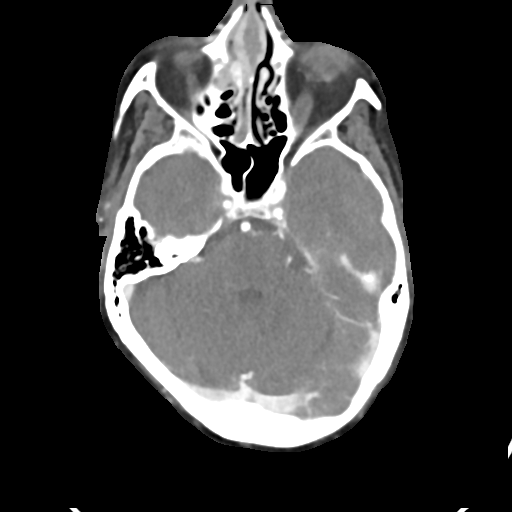

[Series 7: cta neck axial · axial · 0.47mm/px · z∈[-348,-82]mm · 6 of 375 slices shown]
[im 54/375  soft-tissue]
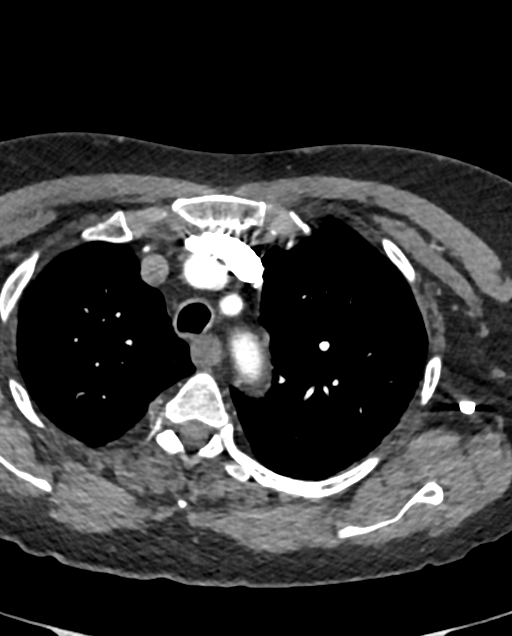
[im 107/375  bone]
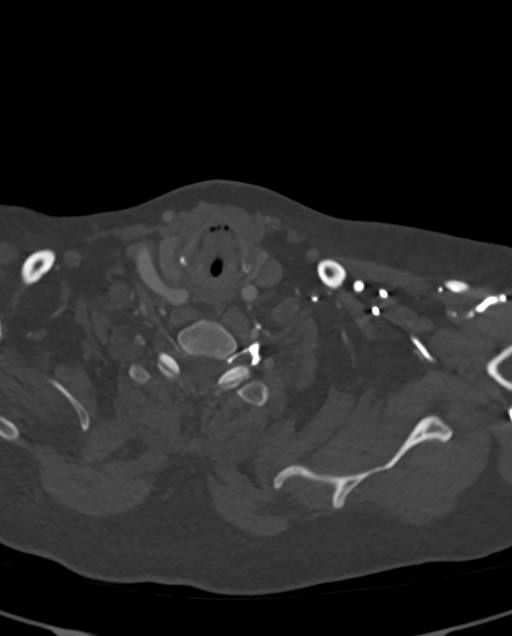
[im 161/375  soft-tissue]
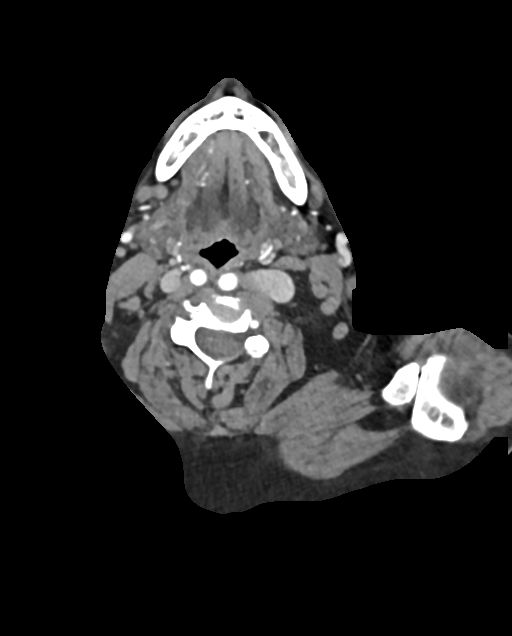
[im 214/375  bone]
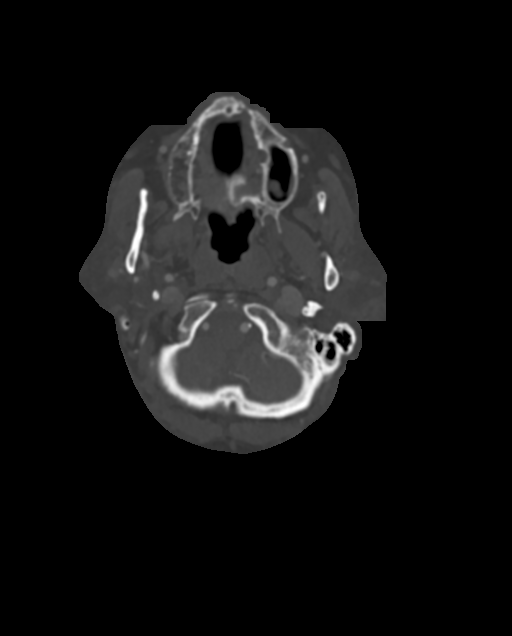
[im 268/375  soft-tissue]
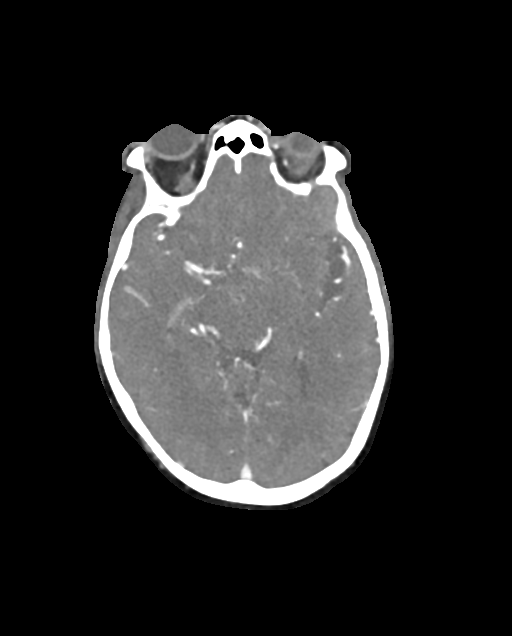
[im 321/375  bone]
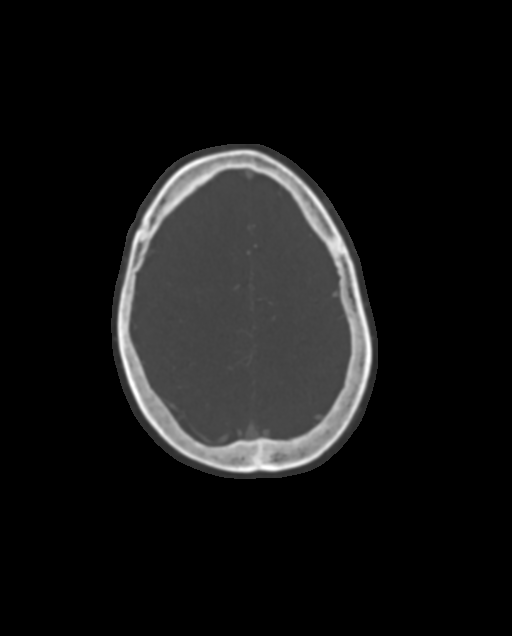

[8 of 33 positions shown; findings below may reference images not displayed]

FINDINGS: CTA NECK FINDINGS

Aortic arch: Common origin of the innominate and left common carotid
arteries. Included portions of the aortic arch demonstrate no
evidence of dissection or aneurysm.

Right carotid system: CCA and ICA patent within the neck without
significant stenosis (50% or greater). No significant
atherosclerosis. Medialized course of the common carotid artery with
partially retropharyngeal course of the ICA.

Left carotid system: CCA and ICA patent within the neck without
significant stenosis (50% or greater). No significant
atherosclerosis. Medialized course of the common carotid artery.

Vertebral arteries: Left vertebral artery dominant. The vertebral
arteries are patent within the neck bilaterally without significant
stenosis (50% or greater). No significant atherosclerotic disease.

Skeleton: No acute bony abnormality. Cervical spondylosis. C5-C6
moderate disc height loss with small posterior disc osteophyte
complex.

Other neck: No soft tissue neck mass

Upper chest: No focal consolidation within the imaged lung apices.
Paraseptal emphysema.

Other: Medialized course of the caudal left internal jugular vein.

Review of the MIP images confirms the above findings

CTA HEAD FINDINGS

Anterior circulation:

Mild atherosclerotic plaque within the bilateral intracranial
internal carotid arteries without significant stenosis.

The right middle cerebral artery is patent without significant
proximal stenosis. The A1 right anterior cerebral artery appears
severely hypoplastic or aplastic.

The bilateral anterior cerebral arteries are patent and
predominantly supplied by the A1 left ACA. The A1 left ACA is patent
without significant stenosis. The left middle cerebral artery is
patent without significant proximal stenosis.

No intracranial aneurysm is identified.

Posterior circulation:

The intracranial vertebral arteries are patent without significant
stenosis. Minimal atherosclerotic calcification within the
intracranial left vertebral artery.

The basilar artery is patent without significant stenosis.

The bilateral posterior cerebral arteries are patent without
significant proximal stenosis.

Venous sinuses: Within limitations of contrast timing, no convincing
thrombus.

Anatomic variants: As described

Other: Extensive partial opacification of the right nasal passage
which may reflect secretions or polypoid soft tissue.

Review of the MIP images confirms the above findings
IMPRESSION: CTA head:

1. No intracranial large vessel occlusion or proximal high-grade
arterial stenosis.
2. Mild intracranial atherosclerotic disease as described.
3. Extensive partial opacification of the right nasal passage which
may reflect secretions or polypoid soft tissue. Correlate clinically
and consider direct visualization.

CTA neck:

1. Common carotid, internal carotid and vertebral arteries patent
within the neck bilaterally without significant stenosis (50% or
greater). Mild atherosclerosis within the visualized aortic arch.
2. Medialized course of the common carotid arteries bilaterally.
Partially retropharyngeal course of the right ICA.
3. Medialized course of the caudal left internal jugular vein.

## 2019-01-16 MED ORDER — ACETAMINOPHEN 160 MG/5ML PO SOLN: 650.0000 mg | ORAL | Status: DC | PRN

## 2019-01-16 MED ORDER — LORAZEPAM 2 MG/ML IJ SOLN
1.0000 mg | Freq: Once | INTRAMUSCULAR | Status: AC
  Administered 2019-01-16: 14:00:00 1 mg via INTRAVENOUS
  Filled 2019-01-16: qty 1

## 2019-01-16 MED ORDER — FOLIC ACID 1 MG PO TABS
1.0000 mg | ORAL_TABLET | Freq: Once | ORAL | Status: AC
  Administered 2019-01-16: 1 mg via ORAL
  Filled 2019-01-16: qty 1

## 2019-01-16 MED ORDER — HYDROCODONE-ACETAMINOPHEN 5-325 MG PO TABS
1.0000 | ORAL_TABLET | Freq: Four times a day (QID) | ORAL | Status: DC | PRN
  Administered 2019-01-18: 1 via ORAL
  Filled 2019-01-16: qty 1

## 2019-01-16 MED ORDER — FOLIC ACID 1 MG PO TABS
1.0000 mg | ORAL_TABLET | Freq: Every day | ORAL | Status: DC
  Administered 2019-01-17 – 2019-01-18 (×2): 1 mg via ORAL
  Filled 2019-01-16 (×2): qty 1

## 2019-01-16 MED ORDER — STROKE: EARLY STAGES OF RECOVERY BOOK
Freq: Once | Status: DC
  Filled 2019-01-16: qty 1

## 2019-01-16 MED ORDER — ACETAMINOPHEN 650 MG RE SUPP: 650.0000 mg | RECTAL | Status: DC | PRN

## 2019-01-16 MED ORDER — ALBUTEROL SULFATE HFA 108 (90 BASE) MCG/ACT IN AERS
1.0000 | INHALATION_SPRAY | Freq: Four times a day (QID) | RESPIRATORY_TRACT | Status: DC | PRN
  Administered 2019-01-17 (×3): 2 via RESPIRATORY_TRACT
  Filled 2019-01-16: qty 6.7

## 2019-01-16 MED ORDER — LORAZEPAM 2 MG/ML IJ SOLN
1.0000 mg | Freq: Once | INTRAMUSCULAR | Status: AC
  Administered 2019-01-16: 1 mg via INTRAVENOUS
  Filled 2019-01-16: qty 1

## 2019-01-16 MED ORDER — ATORVASTATIN CALCIUM 80 MG PO TABS: 80.0000 mg | ORAL_TABLET | Freq: Every day | ORAL | Status: DC

## 2019-01-16 MED ORDER — CLOPIDOGREL BISULFATE 75 MG PO TABS
75.0000 mg | ORAL_TABLET | Freq: Every day | ORAL | Status: DC
  Administered 2019-01-17 – 2019-01-18 (×2): 75 mg via ORAL
  Filled 2019-01-16 (×2): qty 1

## 2019-01-16 MED ORDER — ENOXAPARIN SODIUM 40 MG/0.4ML ~~LOC~~ SOLN
40.0000 mg | Freq: Every day | SUBCUTANEOUS | Status: DC
  Administered 2019-01-16 – 2019-01-17 (×2): 40 mg via SUBCUTANEOUS
  Filled 2019-01-16 (×2): qty 0.4

## 2019-01-16 MED ORDER — IOHEXOL 350 MG/ML SOLN
100.0000 mL | Freq: Once | INTRAVENOUS | Status: AC | PRN
  Administered 2019-01-16: 20:00:00 100 mL via INTRAVENOUS

## 2019-01-16 MED ORDER — ALPRAZOLAM 0.5 MG PO TABS: 1.0000 mg | ORAL_TABLET | Freq: Two times a day (BID) | ORAL | Status: DC | PRN

## 2019-01-16 MED ORDER — VITAMIN B-1 100 MG PO TABS
100.0000 mg | ORAL_TABLET | Freq: Every day | ORAL | Status: DC
  Administered 2019-01-17 – 2019-01-18 (×2): 100 mg via ORAL
  Filled 2019-01-16 (×2): qty 1

## 2019-01-16 MED ORDER — THIAMINE HCL 100 MG/ML IJ SOLN
100.0000 mg | Freq: Once | INTRAMUSCULAR | Status: AC
  Administered 2019-01-16: 100 mg via INTRAVENOUS
  Filled 2019-01-16: qty 2

## 2019-01-16 MED ORDER — ASPIRIN EC 81 MG PO TBEC
81.0000 mg | DELAYED_RELEASE_TABLET | Freq: Every day | ORAL | Status: DC
  Administered 2019-01-17 – 2019-01-18 (×2): 81 mg via ORAL
  Filled 2019-01-16 (×2): qty 1

## 2019-01-16 MED ORDER — ACETAMINOPHEN 325 MG PO TABS: 650.0000 mg | ORAL_TABLET | ORAL | Status: DC | PRN

## 2019-01-16 NOTE — ED Notes (Signed)
Attempted report 

## 2019-01-16 NOTE — ED Provider Notes (Signed)
[facility] EMERGENCY DEPARTMENT Provider Note   CSN: [id number] Arrival date & time: 01/16/19  4098     History   Chief Complaint Chief Complaint  Patient presents with   Fall    HPI Katrina Schmidt is a 58 y.o. female with history of chronic bronchitis, degenerative disc disease, hyperlipidemia, hypertension, sciatica presents today for fall, balance issues and weakness.  Patient reports that for the past 2 days she has been feeling "off balance".  She reports stumbling around and she has never had this problem for just not quite sure what is causing it.  She denies any dizziness or lightheadedness just feels like she cannot maintain her balance.  She reports that she has chronic weakness and tingling of her left leg due to sciatica.  She reports that she has been feeling off balance constantly for the last 2 days with intermittent increased weakness of the bilateral extremities.  She reports went to bed around 3 AM this morning after drinking two 40oz beers still feeling off balance and weak.  She then woke up around 7 AM and attempted to get out of bed when she noticed that both her right arm and her right leg felt weaker and heavy.  This caused her to fall sideways against a wall.  She reports that she was on the ground for a few minutes and was having difficulty using her phone due to the weakness of her right hand.  She denies any injury or pain related to her fall.  She denies any head injury, loss of consciousness or blood thinner use.  She reports that she eventually was able to call a family member who contacted EMS to bring patient into the ER this morning.  Patient reports daily alcohol use, she reports most days out of the week she drinks at least two 40 ounce beers.  Denies fever/chills, headache/vision changes, speech difficulty, facial asymmetry, neck pain/stiffness, chest pain, abdominal pain, back pain, pelvic pain, nausea/vomiting, diarrhea,  dysuria/hematuria, injury/pain numbness, saddle area paresthesias, incontinence, retention or any additional concerns.    HPI  Past Medical History:  Diagnosis Date   Anxiety    Chronic bronchitis (HCC)    DDD (degenerative disc disease), lumbosacral    DJD (degenerative joint disease)    Hyperlipidemia    Hypertension    Sciatica     There are no active problems to display for this patient.   Past Surgical History:  Procedure Laterality Date   HERNIA REPAIR       OB History   No obstetric history on file.      Home Medications    Prior to Admission medications   Medication Sig Start Date End Date Taking? Authorizing Provider  acetaminophen (TYLENOL) 500 MG tablet Take 1,000-2,000 mg by mouth 2 (two) times daily as needed (pain).   Yes [provider]  albuterol (VENTOLIN HFA) 108 (90 Base) MCG/ACT inhaler Inhale 2 puffs into the lungs every 6 (six) hours as needed for wheezing or shortness of breath.   Yes [provider]  ALPRAZolam Prudy Feeler) 0.5 MG tablet Take 1 mg by mouth 2 (two) times daily as needed (panic attacks).  12/27/18  Yes [provider]  amLODipine (NORVASC) 10 MG tablet Take 10 mg by mouth every morning. 12/26/18  Yes [provider]  CALCIUM PO Take 1 tablet by mouth daily.   Yes [provider]  Carboxymethylcellulose Sodium (ARTIFICIAL TEARS OP) Place 1 drop into both eyes 2 (two)  times daily.   Yes [provider]  Cholecalciferol (VITAMIN D3 PO) Take 1 tablet by mouth daily.   Yes [provider]  CRANBERRY PO Take 1 tablet by mouth daily.   Yes [provider]  Cyanocobalamin (VITAMIN B-12 PO) Take 1 tablet by mouth daily.   Yes [provider]  diphenhydrAMINE (BENADRYL) 25 MG tablet Take 50 mg by mouth 2 (two) times daily as needed for allergies.   Yes [provider]  hydrochlorothiazide (HYDRODIURIL) 25 MG tablet Take 25 mg by mouth every morning.  12/17/18  Yes [provider]  HYDROcodone-acetaminophen (NORCO) 5-325 MG tablet Take 1 tablet by mouth every 6 (six) hours as needed for severe pain. 09/25/15  Yes Street, [place], PA-C  lisinopril (ZESTRIL) 40 MG tablet Take 40 mg by mouth every morning. 12/17/18  Yes [provider]  Multiple Vitamin (MULTIVITAMIN WITH MINERALS) TABS tablet Take 1 tablet by mouth daily.   Yes [provider]  simvastatin (ZOCOR) 20 MG tablet Take 20 mg by mouth every morning. 10/22/18  Yes [provider]  VITAMIN E PO Take 1 capsule by mouth daily.   Yes [provider]  azithromycin (ZITHROMAX) 500 MG tablet Take 500 mg by mouth every morning. 01/05/19   [provider]  lidocaine (LIDODERM) 5 % Place 1 patch onto the skin daily. Remove & Discard patch within 12 hours or as directed by MD Patient not taking: Reported on 01/16/2019 01/10/18   Couture, Cortni S, PA-C  triamcinolone cream (KENALOG) 0.1 % Apply 1 application topically 2 (two) times daily. Patient not taking: Reported on 01/16/2019 09/23/16   Jeanie Sewer, PA-C    Family History Family History  Problem Relation Age of Onset   Hypertension Mother     Social History Social History   Tobacco Use   Smoking status: Current Every Day Smoker    Packs/day: 0.50    Years: 40.00    Pack years: 20.00    Types: Cigarettes   Smokeless tobacco: Never Used  Substance Use Topics   Alcohol use: Yes    Comment: Drinks 60oz beer daily   Drug use: No     Allergies   Apple, Citrus, Peach [prunus persica], Pineapple, and Plum pulp   Review of Systems Review of Systems Ten systems are reviewed and are negative for acute change except as noted in the HPI   Physical Exam Updated Vital Signs BP (!) 145/82    Pulse 87    Temp 98.3 F (36.8 C) (Oral)    Resp 15    SpO2 96%   Physical Exam Constitutional:      General: She is not in acute distress.    Appearance: Normal appearance. She is  well-developed. She is not ill-appearing or diaphoretic.  HENT:     Head: Normocephalic and atraumatic.     Right Ear: External ear normal.     Left Ear: External ear normal.     Nose: Nose normal.  Eyes:     General: Vision grossly intact. Gaze aligned appropriately.     Pupils: Pupils are equal, round, and reactive to light.  Neck:     Musculoskeletal: Normal range of motion.     Trachea: Trachea and phonation normal. No tracheal deviation.  Pulmonary:     Effort: Pulmonary effort is normal. No respiratory distress.  Abdominal:     General: There is no distension.     Palpations: Abdomen is soft.     Tenderness:  There is no abdominal tenderness. There is no guarding or rebound.  Musculoskeletal: Normal range of motion.  Skin:    General: Skin is warm and dry.  Neurological:     Mental Status: She is alert.     GCS: GCS eye subscore is 4. GCS verbal subscore is 5. GCS motor subscore is 6.     Comments: Mental Status: Alert, oriented, thought content appropriate, able to give a coherent history. Speech fluent without evidence of aphasia. Able to follow 2 step commands without difficulty. Cranial Nerves: II: Peripheral visual fields grossly normal, pupils equal, round, reactive to light III,IV, VI: ptosis not present, extra-ocular motions intact bilaterally V,VII: smile symmetric, eyebrows raise symmetric, facial light touch sensation equal VIII: hearing grossly normal to voice X: uvula elevates symmetrically XI: bilateral shoulder shrug symmetric and strong XII: midline tongue extension without fassiculations Motor: Normal tone.  2/5 strength in right upper and right lower extremities compared to left with all movements. Sensory: Sensation intact to light touch in all extremities. Cerebellar: Unable to perform finger-nose-finger with right upper extremity, unable to perform pronator test as patient unable to hold her right arm up, patient unable to perform heel-to-shin  right side. CV: distal pulses palpable throughout  Psychiatric:        Behavior: Behavior normal.    ED Treatments / Results  Labs (all labs ordered are listed, but only abnormal results are displayed) Labs Reviewed  CBC - Abnormal; Notable for the following components:      Result Value   Hemoglobin 15.6 (*)    All other components within normal limits  COMPREHENSIVE METABOLIC PANEL - Abnormal; Notable for the following components:   Glucose, Bld 101 (*)    All other components within normal limits  CBG MONITORING, ED - Abnormal; Notable for the following components:   Glucose-Capillary 105 (*)    All other components within normal limits  I-STAT CHEM 8, ED - Abnormal; Notable for the following components:   Hemoglobin 15.3 (*)    All other components within normal limits  SARS CORONAVIRUS 2 (TAT 6-24 HRS)  ETHANOL  PROTIME-INR  APTT  DIFFERENTIAL  RAPID URINE DRUG SCREEN, HOSP PERFORMED  URINALYSIS, ROUTINE W REFLEX MICROSCOPIC  I-STAT BETA HCG BLOOD, ED ([facility], [facility], [facility] ONLY)    EKG None  Radiology Ct Head Wo Contrast  Result Date: 01/16/2019 CLINICAL DATA:  Pain following fall.  Lower extremity weakness EXAM: CT HEAD WITHOUT CONTRAST CT CERVICAL SPINE WITHOUT CONTRAST TECHNIQUE: Multidetector CT imaging of the head and cervical spine was performed following the standard protocol without intravenous contrast. Multiplanar CT image reconstructions of the cervical spine were also generated. COMPARISON:  None. FINDINGS: CT HEAD FINDINGS Brain: Ventricles are normal in size and configuration. There is no intracranial mass, hemorrhage, extra-axial fluid collection, or midline shift. The brain parenchyma appears unremarkable. No evident acute infarct. Vascular: There is no hyperdense vessel. No appreciable vascular calcification. Skull: Bony calvarium appears intact. Sinuses/Orbits: Visualized paranasal sinuses are clear. Visualized orbits appear symmetric bilaterally. Other: Mastoid  air cells are clear. CT CERVICAL SPINE FINDINGS Alignment: There is no demonstrable spondylolisthesis. Skull base and vertebrae: Skull base and craniocervical junction regions appear normal. No fracture evident. There are no blastic or lytic bone lesions. Soft tissues and spinal canal: Prevertebral soft tissues and predental space regions are normal. No cord or canal hematoma. No paraspinous lesions. Disc levels: There is moderate disc space narrowing at C5-6 and to a slightly lesser extent at C4-5. Other disc  spaces appear unremarkable. There is facet hypertrophy at C4-5 and C5-6. No disc extrusion or stenosis. No appreciable nerve root edema or effacement. Upper chest: There is scarring in the apices, slightly more on the right than on the left. Visualized upper lung regions are clear. Other: None IMPRESSION: CT head: Study within normal limits. 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 No fracture or spondylolisthesis. Apical lung scarring noted. Electronically Signed   By: Bretta Bang III M.D.   On: 01/16/2019 10:15   Ct Cervical Spine Wo Contrast  Result Date: 01/16/2019 CLINICAL DATA:  Pain following fall.  Lower extremity weakness EXAM: CT HEAD WITHOUT CONTRAST CT CERVICAL SPINE WITHOUT CONTRAST TECHNIQUE: Multidetector CT imaging of the head and cervical spine was performed following the standard protocol without intravenous contrast. Multiplanar CT image reconstructions of the cervical spine were also generated. COMPARISON:  None. FINDINGS: CT HEAD FINDINGS Brain: Ventricles are normal in size and configuration. There is no intracranial mass, hemorrhage, extra-axial fluid collection, or midline shift. The brain parenchyma appears unremarkable. No evident acute infarct. Vascular: There is no hyperdense vessel. No appreciable vascular calcification. Skull: Bony calvarium appears intact. Sinuses/Orbits: Visualized paranasal sinuses are clear. Visualized orbits  appear symmetric bilaterally. Other: Mastoid air cells are clear. CT CERVICAL SPINE FINDINGS Alignment: There is no demonstrable spondylolisthesis. Skull base and vertebrae: Skull base and craniocervical junction regions appear normal. No fracture evident. There are no blastic or lytic bone lesions. Soft tissues and spinal canal: Prevertebral soft tissues and predental space regions are normal. No cord or canal hematoma. No paraspinous lesions. Disc levels: There is moderate disc space narrowing at C5-6 and to a slightly lesser extent at C4-5. Other disc spaces appear unremarkable. There is facet hypertrophy at C4-5 and C5-6. No disc extrusion or stenosis. No appreciable nerve root edema or effacement. Upper chest: There is scarring in the apices, slightly more on the right than on the left. Visualized upper lung regions are clear. Other: None IMPRESSION: CT head: Study within normal limits. 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 No fracture or spondylolisthesis. Apical lung scarring noted. Electronically Signed   By: Bretta Bang III M.D.   On: 01/16/2019 10:15   Mr Brain Wo Contrast  Result Date: 01/16/2019 CLINICAL DATA:  Focal neuro deficit greater than 6 hours EXAM: MRI HEAD WITHOUT CONTRAST TECHNIQUE: Multiplanar, multiecho pulse sequences of the brain and surrounding structures were obtained without intravenous contrast. COMPARISON:  CT head 01/16/2019 FINDINGS: Brain: Small area of acute infarct in the posterior limb internal capsule on the left. No other acute infarct. Patchy white matter disease bilaterally. Mild patchy hyperintensity in the pons. Negative for hemorrhage or mass. Ventricle size and cerebral volume normal. Vascular: Normal arterial flow voids Skull and upper cervical spine: Negative Sinuses/Orbits: Mild mucosal edema paranasal sinuses.  Normal orbit Other: None IMPRESSION: Small acute infarct posterior limb internal capsule on the left Mild  chronic microvascular ischemic change in the white matter and pons. Electronically Signed   By: Marlan Palau M.D.   On: 01/16/2019 15:07    Procedures .Critical Care Performed by: Bill Salinas, PA-C Authorized by: Bill Salinas, PA-C   Critical care provider statement:    Critical care time (minutes):  45   Critical care was necessary to treat or prevent imminent or life-threatening deterioration of the following conditions:  CNS failure or compromise (CVA)   Critical care was time spent personally by me on  the following activities:  Ordering and performing treatments and interventions, ordering and review of laboratory studies, ordering and review of radiographic studies, pulse oximetry, re-evaluation of patient's condition, review of old charts, obtaining history from patient or surrogate, examination of patient, evaluation of patient's response to treatment, discussions with consultants and development of treatment plan with patient or surrogate   (including critical care time)  Medications Ordered in ED Medications  thiamine (B-1) injection 100 mg (100 mg Intravenous Given 01/16/19 1209)  folic acid (FOLVITE) tablet 1 mg (1 mg Oral Given 01/16/19 1207)  LORazepam (ATIVAN) injection 1 mg (1 mg Intravenous Given 01/16/19 1321)  LORazepam (ATIVAN) injection 1 mg (1 mg Intravenous Given 01/16/19 1412)     Initial Impression / Assessment and Plan / ED Course  I have reviewed the triage vital signs and the nursing notes.  Pertinent labs & imaging results that were available during my care of the patient were reviewed by me and considered in my medical decision making (see chart for details).  Clinical Course as of Jan 15 1545  Fri Jan 16, 2019  1017 Dr. Amada Jupiter   [BM]  1541 Dr. Jacqulyn Bath   [BM]    Clinical Course User Index [BM] Bill Salinas, PA-C   On initial evaluation patient overall well-appearing in no acute distress.  She has right upper and right lower 2/5  strength with all movements.  No cranial nerve deficit.  She has history of daily alcohol abuse but does not appear clinically intoxicated.  Discussed case with, and patient seen by Dr. Judd Lien, will obtain blood work and CT head at this time, patient does not appear to be within stroke window as symptoms have been ongoing for at least 2 days.  Will place consult to neurology for their input at this time. - Awaiting consult to neurology.  Patient reevaluated she is now using her right hand and texting on her phone while holding it up to her face, this is inconsistent with previous examination. - Beta-hCG negative CBG 105 I-STAT Chem-8 nonacute Ethanol negative APTT within normal limits PT/INR within normal limits CMP nonacute CBC nonacute Urinalysis/UDS pending CT head/C-spine:  IMPRESSION:  CT head: Study within normal limits.    CT cervical spine: Areas of osteoarthritic change, primarily at C4-5  and C5-6. No fracture or spondylolisthesis. Apical lung scarring  noted.   CIWA protocol placed, thiamine and folate ordered as patient daily EtOH use. - 10:17 AM: Discussed case with Dr. Amada Jupiter who will be seeing patient. - Rediscussed case with Dr. Judd Lien, will obtain MRI brain at this time. - MRI Brain:  IMPRESSION:  Small acute infarct posterior limb internal capsule on the left    Mild chronic microvascular ischemic change in the white matter and  pons.  - Rediscussed case with neurology APP Shanda Bumps in person who is asked for hospitalist admission. - Patient updated on results today and plan of care she is agreeable for admission for further work-up and treatment.  On reevaluation she is resting comfortably and in no acute distress eating a sandwich. - Discussed case with Dr. Jacqulyn Bath who will be seeing patient for admission.   Note: Portions of this report may have been transcribed using voice recognition software. Every effort was made to ensure accuracy; however,  inadvertent computerized transcription errors may still be present. Final Clinical Impressions(s) / ED Diagnoses   Final diagnoses:  Cerebrovascular accident (CVA), unspecified mechanism [facility])    ED Discharge Orders    None  Deliah Boston, PA-C 01/16/19 1547    Veryl Speak, MD 01/18/19 2326

## 2019-01-16 NOTE — Consult Note (Addendum)
NEURO HOSPITALIST  CONSULT   Requesting Physician: Dr. Stark Jock    Chief Complaint: dizziness/lightheadedness  History obtained from:  Patient    HPI:                                                                                                                                         LILLER YOHN is an 58 y.o. female  With PMH significant for HTN, HLD, sciatica who presents with c/o balance issues, weakness, dizziness s/p fall today.    per patient she has been having trouble walking ( increased stumbling), and some weakness for the past two days. She states that this morning was when she noticed that she was having real trouble with her right arm. It has been hard to get it to do what she wants it to do. Today she stumbled  Until she fell and then had problems getting up.  Denied any one sided leg weakness. She stated that both "legs gave away under her". So she came to ED. She endorses smoking 1/2 pack of cigarettes per day and drinking 40 - 60 ounces of beer every evening. Denies vision changes, drug abuse, numbness, tingling. No prior stroke history.   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 Anxiety   . Chronic bronchitis ([place])   . DDD (degenerative disc disease), lumbosacral   . DJD (degenerative joint disease)   . Hyperlipidemia   . Hypertension   . Sciatica     Past Surgical History:  Procedure Laterality Date  . HERNIA REPAIR      Family History  Problem Relation Age of Onset  . Hypertension Mother     Social History:  reports that she has been smoking cigarettes. She has a 20.00 pack-year smoking history. She has never used smokeless tobacco. She reports current  alcohol use. She reports that she does not use drugs.  Allergies:  Allergies  Allergen Reactions  . Apple Itching    Reaction to red apple  . Citrus Itching  . Peach [Prunus Persica] Itching    Reaction to peaches with skin on  . Pineapple Itching  . Plum Pulp Itching    Medications:  Current Facility-Administered Medications  Medication Dose Route Frequency Provider Last Rate Last Dose  .  stroke: mapping our early stages of recovery book   Does not apply Once Pahwani, Rinka R, MD      . acetaminophen (TYLENOL) tablet 650 mg  650 mg Oral Q4H PRN Pahwani, Rinka R, MD       Or  . acetaminophen (TYLENOL) solution 650 mg  650 mg Per Tube Q4H PRN Pahwani, Rinka R, MD       Or  . acetaminophen (TYLENOL) suppository 650 mg  650 mg Rectal Q4H PRN Pahwani, Rinka R, MD      . ALPRAZolam (XANAX) tablet 1 mg  1 mg Oral BID PRN Pahwani, Rinka R, MD      . aspirin EC tablet 81 mg  81 mg Oral Daily Pahwani, Rinka R, MD      . atorvastatin (LIPITOR) tablet 80 mg  80 mg Oral q1800 Pahwani, Rinka R, MD      . enoxaparin (LOVENOX) injection 40 mg  40 mg Subcutaneous Q24H Pahwani, Rinka R, MD      . folic acid (FOLVITE) tablet 1 mg  1 mg Oral Daily Pahwani, Rinka R, MD      . HYDROcodone-acetaminophen (NORCO/VICODIN) 5-325 MG per tablet 1 tablet  1 tablet Oral Q6H PRN Pahwani, Rinka R, MD      . Melene Muller ON 01/17/2019] thiamine (VITAMIN B-1) tablet 100 mg  100 mg Oral Daily Pahwani, Rinka R, MD       Current Outpatient Medications  Medication Sig Dispense Refill  . acetaminophen (TYLENOL) 500 MG tablet Take 1,000-2,000 mg by mouth 2 (two) times daily as needed (pain).    Marland Kitchen albuterol (VENTOLIN HFA) 108 (90 Base) MCG/ACT inhaler Inhale 2 puffs into the lungs every 6 (six) hours as needed for wheezing or shortness of breath.    . ALPRAZolam (XANAX) 0.5 MG tablet Take 1 mg by mouth 2  (two) times daily as needed (panic attacks).     Marland Kitchen amLODipine (NORVASC) 10 MG tablet Take 10 mg by mouth every morning.    Marland Kitchen CALCIUM PO Take 1 tablet by mouth daily.    . Carboxymethylcellulose Sodium (ARTIFICIAL TEARS OP) Place 1 drop into both eyes 2 (two) times daily.    . Cholecalciferol (VITAMIN D3 PO) Take 1 tablet by mouth daily.    Marland Kitchen CRANBERRY PO Take 1 tablet by mouth daily.    . Cyanocobalamin (VITAMIN B-12 PO) Take 1 tablet by mouth daily.    . diphenhydrAMINE (BENADRYL) 25 MG tablet Take 50 mg by mouth 2 (two) times daily as needed for allergies.    . hydrochlorothiazide (HYDRODIURIL) 25 MG tablet Take 25 mg by mouth every morning.    Marland Kitchen HYDROcodone-acetaminophen (NORCO) 5-325 MG tablet Take 1 tablet by mouth every 6 (six) hours as needed for severe pain. 10 tablet 0  . lisinopril (ZESTRIL) 40 MG tablet Take 40 mg by mouth every morning.    . Multiple Vitamin (MULTIVITAMIN WITH MINERALS) TABS tablet Take 1 tablet by mouth daily.    . simvastatin (ZOCOR) 20 MG tablet Take 20 mg by mouth every morning.    Marland Kitchen VITAMIN E PO Take 1 capsule by mouth daily.    Marland Kitchen azithromycin (ZITHROMAX) 500 MG tablet Take 500 mg by mouth every morning.    . lidocaine (LIDODERM) 5 % Place 1 patch onto the skin daily. Remove & Discard patch within 12 hours or as directed by MD (Patient not taking: Reported on  01/16/2019) 30 patch 0  . triamcinolone cream (KENALOG) 0.1 % Apply 1 application topically 2 (two) times daily. (Patient not taking: Reported on 01/16/2019) 30 g 0     ROS:                                                                                                                                       ROS was performed and is negative except as noted in HPI    General Examination:                                                                                                      Blood pressure (!) 174/86, pulse 78, temperature 98.3 F (36.8 C), temperature source Oral, resp. rate 18, SpO2 96  %.  HEENT-  Normocephalic, no lesions, without obvious abnormality.  Normal external eye and conjunctiva. Cardiovascular-  pulses palpable throughout  Lungs- no excessive working breathing.  Saturations within normal limits on RA Extremities- Warm, dry and intact, right foot with internal rotation.  Musculoskeletal-no joint tenderness, deformity or swelling Skin-warm and dry, no hyperpigmentation, vitiligo, or suspicious lesions  Neurological Examination Mental Status: Alert, oriented, thought content appropriate.  Speech fluent without evidence of aphasia.  Able to follow commands without difficulty. Cranial Nerves: II:  Visual fields grossly normal,  III,IV, VI: ptosis not present, extra-ocular motions intact bilaterally, pupils equal, round, reactive to light and accommodation, right eye green and left eye is blue ( color contact lenses) V,VII: smile asymmetric, facial droop noted,  facial light touch sensation normal bilaterally VIII: hearing normal bilaterally IX,X: uvula rises midline XI: bilateral shoulder shrug XII: midline tongue extension Motor: Right : Upper extremity   3/5  Left:     Upper extremity   5/5  Lower extremity   4/5   Lower extremity   5/5 Tone and bulk:normal tone throughout; no atrophy noted Sensory:  light touch intact throughout, bilaterally Deep Tendon Reflexes: 2+ and symmetric biceps and patella Plantars: Right: downgoing   Left: downgoing Cerebellar: normal finger-to-nose on left, unable to perform on RUE . HTS normal left, unable to perform right leg Gait: deferred   Lab Results: Basic Metabolic Panel: Recent Labs  Lab 01/16/19 0923 01/16/19 0952  NA 140 141  K 4.0 3.8  CL 105 107  CO2 24  --   GLUCOSE 101* 98  BUN 8 9  CREATININE 0.68 0.50  CALCIUM 9.0  --     CBC: Recent Labs  Lab 01/16/19 [phone]  01/16/19 0952  WBC 7.4  --   NEUTROABS 4.6  --   HGB 15.6* 15.3*  HCT 46.0 45.0  MCV 95.8  --   PLT 339  --     CBG: Recent Labs   Lab 01/16/19 0847  GLUCAP 105*    Imaging: Ct Head Wo Contrast  Result Date: 01/16/2019 CLINICAL DATA:  Pain following fall.  Lower extremity weakness EXAM: CT HEAD WITHOUT CONTRAST CT CERVICAL SPINE WITHOUT CONTRAST TECHNIQUE: Multidetector CT imaging of the head and cervical spine was performed following the standard protocol without intravenous contrast. Multiplanar CT image reconstructions of the cervical spine were also generated. COMPARISON:  None. FINDINGS: CT HEAD FINDINGS Brain: Ventricles are normal in size and configuration. There is no intracranial mass, hemorrhage, extra-axial fluid collection, or midline shift. The brain parenchyma appears unremarkable. No evident acute infarct. Vascular: There is no hyperdense vessel. No appreciable vascular calcification. Skull: Bony calvarium appears intact. Sinuses/Orbits: Visualized paranasal sinuses are clear. Visualized orbits appear symmetric bilaterally. Other: Mastoid air cells are clear. CT CERVICAL SPINE FINDINGS Alignment: There is no demonstrable spondylolisthesis. Skull base and vertebrae: Skull base and craniocervical junction regions appear normal. No fracture evident. There are no blastic or lytic bone lesions. Soft tissues and spinal canal: Prevertebral soft tissues and predental space regions are normal. No cord or canal hematoma. No paraspinous lesions. Disc levels: There is moderate disc space narrowing at C5-6 and to a slightly lesser extent at C4-5. Other disc spaces appear unremarkable. There is facet hypertrophy at C4-5 and C5-6. No disc extrusion or stenosis. No appreciable nerve root edema or effacement. Upper chest: There is scarring in the apices, slightly more on the right than on the left. Visualized upper lung regions are clear. Other: None IMPRESSION: CT head: Study within normal limits. 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 No fracture or spondylolisthesis. Apical lung scarring noted.  Electronically Signed   By: Bretta Bang III M.D.   On: 01/16/2019 10:15   Ct Cervical Spine Wo Contrast  Result Date: 01/16/2019 CLINICAL DATA:  Pain following fall.  Lower extremity weakness EXAM: CT HEAD WITHOUT CONTRAST CT CERVICAL SPINE WITHOUT CONTRAST TECHNIQUE: Multidetector CT imaging of the head and cervical spine was performed following the standard protocol without intravenous contrast. Multiplanar CT image reconstructions of the cervical spine were also generated. COMPARISON:  None. FINDINGS: CT HEAD FINDINGS Brain: Ventricles are normal in size and configuration. There is no intracranial mass, hemorrhage, extra-axial fluid collection, or midline shift. The brain parenchyma appears unremarkable. No evident acute infarct. Vascular: There is no hyperdense vessel. No appreciable vascular calcification. Skull: Bony calvarium appears intact. Sinuses/Orbits: Visualized paranasal sinuses are clear. Visualized orbits appear symmetric bilaterally. Other: Mastoid air cells are clear. CT CERVICAL SPINE FINDINGS Alignment: There is no demonstrable spondylolisthesis. Skull base and vertebrae: Skull base and craniocervical junction regions appear normal. No fracture evident. There are no blastic or lytic bone lesions. Soft tissues and spinal canal: Prevertebral soft tissues and predental space regions are normal. No cord or canal hematoma. No paraspinous lesions. Disc levels: There is moderate disc space narrowing at C5-6 and to a slightly lesser extent at C4-5. Other disc spaces appear unremarkable. There is facet hypertrophy at C4-5 and C5-6. No disc extrusion or stenosis. No appreciable nerve root edema or effacement. Upper chest: There is scarring in the apices, slightly more on the right than on the left. Visualized upper lung regions are clear. Other: None IMPRESSION: CT head: Study  within normal limits. 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 No fracture or  spondylolisthesis. Apical lung scarring noted. Electronically Signed   By: Bretta BangWilliam  Woodruff III M.D.   On: 01/16/2019 10:15       Valentina LucksJessica Williams, MSN, NP-C [facility] [phone]  01/16/2019, 2:05 PM   Attending physician note to follow with Assessment and plan .  I have seen and evaluated the patient. I have reviewed the above note and made appropriate changes.    Assessment: Hale BogusVeronica E Crothers is an 58 y.o. female  With PMH significant for HTN, HLD, sciatica who presents with c/o balance issues, weakness, dizziness s/p fall today. 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 CTH: no hemorrhage. Admit for complete stroke work-up.    Stroke Risk Factors - hyperlipidemia, hypertension and smoking    Recommendations: -- BP goal : Permissive HTN upto 220/110 mmHg (for 24-48 post admission )  --CTA head and neck --Echocardiogram -- ASA -- High intensity Statin if LDL > 70 -- HgbA1c, fasting lipid panel -- PT consult, OT consult, Speech consult --Telemetry monitoring --Frequent neuro checks --Stroke swallow screen  --smoking cessation   Ritta SlotMcNeill Dekayla Prestridge, MD [facility] [phone]  If 7pm- 7am, please page neurology on call as listed in [facility].

## 2019-01-16 NOTE — H&P (Signed)
History and Physical    Katrina Schmidt:[id number] DOB: [birth year] DOA: 01/16/2019  PCP: System, Provider Not In  Patient coming from: Home I have personally briefly reviewed patient[facility]  Chief Complaint: Syncope and right-sided weakness started this morning.  HPI: Katrina Schmidt is a 58 y.o. female with medical history significant of hypertension, hyperlipidemia, degenerative disc disease, sciatica, panic attacks, tobacco abuse, alcohol abuse, presents today for fall, balance issues and weakness of right side.  Patient reports that since 2 days she has been feeling off balance constantly with intermittent increased weakness of the right side.  Reports stumbling around and she was never had this problem before.  Reports she went to bed around 3 AM this morning after drinking 40 ounce beers x2 and then she woke up around 7 AM and attempted to get out of the bed when she noticed that her both right arm and right leg felt weaker and heavy and then she fell. She denies head trauma, seizures, loss of consciousness, urinary/bowel incontinence, tongue bite, headache, blurry vision, lightheadedness, dizziness, chest pain, shortness of breath, palpitation, nausea, vomiting, fever, chills, decreased appetite or weight loss.  She lives with her daughter and smokes 10 cigarettes/day, drinks alcohol 60 ounces almost every day, denies illicit drug use.    ED Course: CT head and cervical spine came back negative.  MRI brain came back positive for small acute infarct posterior limb internal capsule on the left.  Mild chronic microvascular ischemic changes in the white matter and pons.  EDP consulted neurology, patient is out  TPA window.  Her initial labs such as CBC, CMP, PT/INR came back within normal limits.  Ethanol: Within normal limit.   Review of Systems: As per HPI otherwise negative.    Past Medical History:  Diagnosis Date   Anxiety    Chronic  bronchitis (HCC)    DDD (degenerative disc disease), lumbosacral    DJD (degenerative joint disease)    Hyperlipidemia    Hypertension    Sciatica     Past Surgical History:  Procedure Laterality Date   HERNIA REPAIR       reports that she has been smoking cigarettes. She has a 20.00 pack-year smoking history. She has never used smokeless tobacco. She reports current alcohol use. She reports that she does not use drugs.  Allergies  Allergen Reactions   Apple Itching    Reaction to red apple   Citrus Itching   Peach [Prunus Persica] Itching    Reaction to peaches with skin on   Pineapple Itching   Plum Pulp Itching    Family History  Problem Relation Age of Onset   Hypertension Mother     Prior to Admission medications   Medication Sig Start Date End Date Taking? Authorizing Provider  acetaminophen (TYLENOL) 500 MG tablet Take 1,000-2,000 mg by mouth 2 (two) times daily as needed (pain).   Yes [provider]  albuterol (VENTOLIN HFA) 108 (90 Base) MCG/ACT inhaler Inhale 2 puffs into the lungs every 6 (six) hours as needed for wheezing or shortness of breath.   Yes [provider]  ALPRAZolam Prudy Feeler(XANAX) 0.5 MG tablet Take 1 mg by mouth 2 (two) times daily as needed (panic attacks).  12/27/18  Yes [provider]  amLODipine (NORVASC) 10 MG tablet Take 10 mg by mouth every morning. 12/26/18  Yes [provider]  CALCIUM PO Take 1 tablet by mouth daily.   Yes [provider]  Carboxymethylcellulose Sodium (ARTIFICIAL TEARS OP) Place 1 drop into both eyes 2 (two) times daily.   Yes [provider]  Cholecalciferol (VITAMIN D3 PO) Take 1 tablet by mouth daily.   Yes [provider]  CRANBERRY PO Take 1 tablet by mouth daily.   Yes [provider]  Cyanocobalamin (VITAMIN B-12 PO) Take 1 tablet by mouth daily.   Yes [provider]  diphenhydrAMINE (BENADRYL) 25 MG tablet Take 50 mg by mouth 2  (two) times daily as needed for allergies.   Yes [provider]  hydrochlorothiazide (HYDRODIURIL) 25 MG tablet Take 25 mg by mouth every morning. 12/17/18  Yes [provider]  HYDROcodone-acetaminophen (NORCO) 5-325 MG tablet Take 1 tablet by mouth every 6 (six) hours as needed for severe pain. 09/25/15  Yes Street, [place], PA-C  lisinopril (ZESTRIL) 40 MG tablet Take 40 mg by mouth every morning. 12/17/18  Yes [provider]  Multiple Vitamin (MULTIVITAMIN WITH MINERALS) TABS tablet Take 1 tablet by mouth daily.   Yes [provider]  simvastatin (ZOCOR) 20 MG tablet Take 20 mg by mouth every morning. 10/22/18  Yes [provider]  VITAMIN E PO Take 1 capsule by mouth daily.   Yes [provider]  azithromycin (ZITHROMAX) 500 MG tablet Take 500 mg by mouth every morning. 01/05/19   [provider]  lidocaine (LIDODERM) 5 % Place 1 patch onto the skin daily. Remove & Discard patch within 12 hours or as directed by MD Patient not taking: Reported on 01/16/2019 01/10/18   Couture, Cortni S, PA-C  triamcinolone cream (KENALOG) 0.1 % Apply 1 application topically 2 (two) times daily. Patient not taking: Reported on 01/16/2019 09/23/16   Renita Papa, [place]    Physical Exam: Vitals:   01/16/19 0902 01/16/19 0930 01/16/19 1153 01/16/19 1300  BP: (!) 161/109 (!) 148/96 (!) 174/86 (!) 145/82  Pulse:  72 78 87  Resp: 16 18  15   Temp:      TempSrc:      SpO2:  96%  96%    Constitutional: NAD, calm, comfortable Vitals:   01/16/19 0902 01/16/19 0930 01/16/19 1153 01/16/19 1300  BP: (!) 161/109 (!) 148/96 (!) 174/86 (!) 145/82  Pulse:  72 78 87  Resp: 16 18  15   Temp:      TempSrc:      SpO2:  96%  96%   Constitutional: Alert and oriented x3, communicating well, not in acute distress.   Eyes: PERRL, lids and conjunctivae normal ENMT: Mucous membranes are moist. Posterior pharynx clear of any exudate or lesions.has very poor dentition.     Neck: normal, supple, no masses, no thyromegaly Respiratory: clear to auscultation bilaterally, no wheezing, no crackles. Normal respiratory effort. No accessory muscle use.  Cardiovascular: Regular rate and rhythm, no murmurs / rubs / gallops. No extremity edema. 2+ pedal pulses. No carotid bruits.  Abdomen: no tenderness, no masses palpated. No hepatosplenomegaly. Bowel sounds positive.  Musculoskeletal: no clubbing / cyanosis. No joint deformity upper and lower extremities. Good ROM, no contractures. Normal muscle tone.  Skin: no rashes, lesions, ulcers. No induration Neurologic: CN 2-12 grossly intact. Sensation intact, power 2 out of 5 in right upper and lower extremity, 5 out of 5 in left upper and lower extremity.   Psychiatric: Normal judgment and insight. Alert and oriented x 3. Normal mood.    Labs on Admission: I have personally reviewed following labs and imaging studies  CBC: Recent Labs  Lab 01/16/19 0923 01/16/19 0952  WBC 7.4  --   NEUTROABS 4.6  --   HGB 15.6* 15.3*  HCT 46.0 45.0  MCV 95.8  --   PLT 339  --    Basic Metabolic Panel: Recent Labs  Lab 01/16/19 0923 01/16/19 0952  NA 140 141  K 4.0 3.8  CL 105 107  CO2 24  --   GLUCOSE 101* 98  BUN 8 9  CREATININE 0.68 0.50  CALCIUM 9.0  --    GFR: CrCl cannot be calculated (Unknown ideal weight.). Liver Function Tests: Recent Labs  Lab 01/16/19 0923  AST 22  ALT 25  ALKPHOS 64  BILITOT 0.7  PROT 6.5  ALBUMIN 3.8   No results for input(s): LIPASE, AMYLASE in the last 168 hours. No results for input(s): AMMONIA in the last 168 hours. Coagulation Profile: Recent Labs  Lab 01/16/19 0923  INR 1.1   Cardiac Enzymes: No results for input(s): CKTOTAL, CKMB, CKMBINDEX, TROPONINI in the last 168 hours. BNP (last 3 results) No results for input(s): PROBNP in the last 8760 hours. HbA1C: No results for input(s): HGBA1C in the last 72 hours. CBG: Recent Labs  Lab 01/16/19 0847  GLUCAP 105*    Lipid Profile: No results for input(s): CHOL, HDL, LDLCALC, TRIG, CHOLHDL, LDLDIRECT in the last 72 hours. Thyroid Function Tests: No results for input(s): TSH, T4TOTAL, FREET4, T3FREE, THYROIDAB in the last 72 hours. Anemia Panel: No results for input(s): VITAMINB12, FOLATE, FERRITIN, TIBC, IRON, RETICCTPCT in the last 72 hours. Urine analysis: No results found for: COLORURINE, APPEARANCEUR, LABSPEC, PHURINE, GLUCOSEU, HGBUR, BILIRUBINUR, KETONESUR, PROTEINUR, UROBILINOGEN, NITRITE, LEUKOCYTESUR  Radiological Exams on Admission: Ct Head Wo Contrast  Result Date: 01/16/2019 CLINICAL DATA:  Pain following fall.  Lower extremity weakness EXAM: CT HEAD WITHOUT CONTRAST CT CERVICAL SPINE WITHOUT CONTRAST TECHNIQUE: Multidetector CT imaging of the head and cervical spine was performed following the standard protocol without intravenous contrast. Multiplanar CT image reconstructions of the cervical spine were also generated. COMPARISON:  None. FINDINGS: CT HEAD FINDINGS Brain: Ventricles are normal in size and configuration. There is no intracranial mass, hemorrhage, extra-axial fluid collection, or midline shift. The brain parenchyma appears unremarkable. No evident acute infarct. Vascular: There is no hyperdense vessel. No appreciable vascular calcification. Skull: Bony calvarium appears intact. Sinuses/Orbits: Visualized paranasal sinuses are clear. Visualized orbits appear symmetric bilaterally. Other: Mastoid air cells are clear. CT CERVICAL SPINE FINDINGS Alignment: There is no demonstrable spondylolisthesis. Skull base and vertebrae: Skull base and craniocervical junction regions appear normal. No fracture evident. There are no blastic or lytic bone lesions. Soft tissues and spinal canal: Prevertebral soft tissues and predental space regions are normal. No cord or canal hematoma. No paraspinous lesions. Disc levels: There is moderate disc space narrowing at C5-6 and to a slightly lesser extent at  C4-5. Other disc spaces appear unremarkable. There is facet hypertrophy at C4-5 and C5-6. No disc extrusion or stenosis. No appreciable nerve root edema or effacement. Upper chest: There is scarring in the apices, slightly more on the right than on the left. Visualized upper lung regions are clear. Other: None IMPRESSION: CT head: Study within normal limits. 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 No fracture or spondylolisthesis. Apical lung scarring noted. Electronically Signed   By: Bretta Bang III M.D.   On: 01/16/2019 10:15   Ct Cervical Spine Wo Contrast  Result Date: 01/16/2019 CLINICAL DATA:  Pain following fall.  Lower extremity weakness EXAM:  CT HEAD WITHOUT CONTRAST CT CERVICAL SPINE WITHOUT CONTRAST TECHNIQUE: Multidetector CT imaging of the head and cervical spine was performed following the standard protocol without intravenous contrast. Multiplanar CT image reconstructions of the cervical spine were also generated. COMPARISON:  None. FINDINGS: CT HEAD FINDINGS Brain: Ventricles are normal in size and configuration. There is no intracranial mass, hemorrhage, extra-axial fluid collection, or midline shift. The brain parenchyma appears unremarkable. No evident acute infarct. Vascular: There is no hyperdense vessel. No appreciable vascular calcification. Skull: Bony calvarium appears intact. Sinuses/Orbits: Visualized paranasal sinuses are clear. Visualized orbits appear symmetric bilaterally. Other: Mastoid air cells are clear. CT CERVICAL SPINE FINDINGS Alignment: There is no demonstrable spondylolisthesis. Skull base and vertebrae: Skull base and craniocervical junction regions appear normal. No fracture evident. There are no blastic or lytic bone lesions. Soft tissues and spinal canal: Prevertebral soft tissues and predental space regions are normal. No cord or canal hematoma. No paraspinous lesions. Disc levels: There is moderate disc space narrowing  at C5-6 and to a slightly lesser extent at C4-5. Other disc spaces appear unremarkable. There is facet hypertrophy at C4-5 and C5-6. No disc extrusion or stenosis. No appreciable nerve root edema or effacement. Upper chest: There is scarring in the apices, slightly more on the right than on the left. Visualized upper lung regions are clear. Other: None IMPRESSION: CT head: Study within normal limits. 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 No fracture or spondylolisthesis. Apical lung scarring noted. Electronically Signed   By: Bretta Bang III M.D.   On: 01/16/2019 10:15   Mr Brain Wo Contrast  Result Date: 01/16/2019 CLINICAL DATA:  Focal neuro deficit greater than 6 hours EXAM: MRI HEAD WITHOUT CONTRAST TECHNIQUE: Multiplanar, multiecho pulse sequences of the brain and surrounding structures were obtained without intravenous contrast. COMPARISON:  CT head 01/16/2019 FINDINGS: Brain: Small area of acute infarct in the posterior limb internal capsule on the left. No other acute infarct. Patchy white matter disease bilaterally. Mild patchy hyperintensity in the pons. Negative for hemorrhage or mass. Ventricle size and cerebral volume normal. Vascular: Normal arterial flow voids Skull and upper cervical spine: Negative Sinuses/Orbits: Mild mucosal edema paranasal sinuses.  Normal orbit Other: None IMPRESSION: Small acute infarct posterior limb internal capsule on the left Mild chronic microvascular ischemic change in the white matter and pons. Electronically Signed   By: Marlan Palau M.D.   On: 01/16/2019 15:07    EKG: Normal sinus rhythm, no acute ST-T wave changes noted.  Assessment/Plan Principal Problem:   CVA (cerebral vascular accident) (HCC) Active Problems:   Hypertension   Hyperlipidemia   Tobacco abuse   Alcohol abuse   Sciatica   Panic attacks    Acute ischemic stroke: -Patient presented with right-sided weakness and off balance.  Risk  factors includes: Hypertension, hyperlipidemia, tobacco abuse and alcohol abuse -CT head and cervical spine: Negative.  MRI brain shows small acute infarction posterior limb internal capsule on the left.  Mild chronic microvascular ischemic changes in the white matter in the pons. -admit forTelemetry monitoring -Stroke protocol, Maintain Euthermia. -Allow for permissive hypertension for the first 24-48h - only treat PRN if SBP >220 mmHg or diastolic blood pressure >120. Blood pressures can be gradually normalized to SBP<140 upon discharge. -Ordered ultrasound of carotid arteries, echocardiogram to- rule out PFO -Ordered lipid Panel, A1C -Frequent neuro checks.  N.p.o. until passes bedside swallow evaluation. -Aspirin 81 mg p.o. daily and atorvastatin 80 mg p.o. daily ordered. -Consult  Neurology-appreciate help. -PT/OT eval, Speech consult  Hypertension: -She takes amlodipine, HCTZ and lisinopril at home.  Will hold blood pressure medicines to allow permissive hypertension. -Monitor blood pressure closely.  Hyperlipidemia: Check lipid panel -Start on atorvastatin 80 mg once daily.  Tobacco abuse: Counseled about cessation.  Alcohol abuse: -Ethanol level: WNL. -CIWA protocol initiated. -Start on thiamine 100 mg p.o. daily and folic acid 1 mg p.o. daily. -On seizure precaution.  Panic attacks: -We will continue home dose of Xanax PRN for panic attacks.  Degenerative disc disease: -We will continue home dose of Norco  DVT prophylaxis: Lovenox/TED/SCD Code Status: Full code  family Communication: None present at bedside.  Plan of care discussed with patient in length and he verbalized understanding and agreed with it. Disposition Plan: TBD Consults called: Neurology  admission status: Inpatient  Ollen Bowl MD [facility] Pager [phone]  If 7PM-7AM, please contact night-coverage [email or web address] Password [facility]  01/16/2019, 3:58 PM

## 2019-01-16 NOTE — ED Notes (Signed)
Transported to CT 

## 2019-01-16 NOTE — Progress Notes (Signed)
Received the Pt from ED, alert and orientedX4, placed on Tele and verified, seizure precautions place, implemented MD orders

## 2019-01-16 NOTE — ED Triage Notes (Signed)
Pt brought to ED via [facility]. EMS reports pt fell at home this morning walking in house when legs became weak, pt denies injuries from fall. Pt's BP en route was 160/120, EMS reports pt did not take BP meds this morning. Pt states that she began to experience weakness in her legs about two days ago and has been stumbling since then. Pt is A&O x4. Active ROM, all extremities.

## 2019-01-16 NOTE — ED Notes (Signed)
Pt given meal, per Dr. Stark Jock.

## 2019-01-17 ENCOUNTER — Inpatient Hospital Stay (HOSPITAL_COMMUNITY): Payer: Medicaid Other

## 2019-01-17 DIAGNOSIS — F1410 Cocaine abuse, uncomplicated: Secondary | ICD-10-CM

## 2019-01-17 DIAGNOSIS — I63312 Cerebral infarction due to thrombosis of left middle cerebral artery: Secondary | ICD-10-CM

## 2019-01-17 DIAGNOSIS — I6389 Other cerebral infarction: Secondary | ICD-10-CM

## 2019-01-17 LAB — LIPID PANEL
Cholesterol: 142 mg/dL (ref 0–200)
HDL: 35 mg/dL — ABNORMAL LOW (ref >40)
LDL Cholesterol: 52 mg/dL (ref 0–99)
Total CHOL/HDL Ratio: 4.1 RATIO
Triglycerides: 275 mg/dL — ABNORMAL HIGH (ref <150)
VLDL: 55 mg/dL — ABNORMAL HIGH (ref 0–40)

## 2019-01-17 LAB — RAPID URINE DRUG SCREEN, HOSP PERFORMED
Amphetamines: NOT DETECTED
Barbiturates: NOT DETECTED
Benzodiazepines: POSITIVE — AB
Cocaine: POSITIVE — AB
Opiates: NOT DETECTED
Tetrahydrocannabinol: NOT DETECTED

## 2019-01-17 LAB — URINALYSIS, ROUTINE W REFLEX MICROSCOPIC
Bacteria, UA: NONE SEEN
Bilirubin Urine: NEGATIVE
Glucose, UA: NEGATIVE mg/dL
Ketones, ur: NEGATIVE mg/dL
Leukocytes,Ua: NEGATIVE
Nitrite: NEGATIVE
Protein, ur: NEGATIVE mg/dL
Specific Gravity, Urine: >1.046 — ABNORMAL HIGH (ref 1.005–1.030)
pH: 5 (ref 5.0–8.0)

## 2019-01-17 LAB — ECHOCARDIOGRAM COMPLETE
Height: 65 in
Weight: 2659.63 oz

## 2019-01-17 LAB — HEMOGLOBIN A1C
Hgb A1c MFr Bld: 5.5 % (ref 4.8–5.6)
Mean Plasma Glucose: 111.15 mg/dL

## 2019-01-17 LAB — SARS CORONAVIRUS 2 (TAT 6-24 HRS): SARS Coronavirus 2: NEGATIVE

## 2019-01-17 MED ORDER — HYDRALAZINE HCL 10 MG PO TABS: 10.0000 mg | ORAL_TABLET | Freq: Four times a day (QID) | ORAL | Status: DC | PRN

## 2019-01-17 MED ORDER — ATORVASTATIN CALCIUM 40 MG PO TABS
40.0000 mg | ORAL_TABLET | Freq: Every day | ORAL | Status: DC
  Administered 2019-01-17: 40 mg via ORAL
  Filled 2019-01-17: qty 1

## 2019-01-17 NOTE — Progress Notes (Signed)
  Echocardiogram 2D Echocardiogram has been performed.  Merrie Roof F 01/17/2019, 2:47 PM

## 2019-01-17 NOTE — Progress Notes (Signed)
Pt. BP 184/00, Dr. Lonny Prude notified, awaiting orders, continuing to assess. Katrina Schmidt Katrina Schmidt

## 2019-01-17 NOTE — Progress Notes (Addendum)
STROKE TEAM PROGRESS NOTE   INTERVAL HISTORY NO family is at the bedside.  She is eager to go home. She still has right sided hemiparesis. PT/OT recommend CIR.   OBJECTIVE Vitals:   01/16/19 2338 01/17/19 0330 01/17/19 0800 01/17/19 1140  BP: (!) 164/96 (!) 157/84 (!) 154/93 (!) 177/101  Pulse: 92 64 79 97  Resp: 18 20 19 18   Temp: 98.8 F (37.1 C) 98.5 F (36.9 C) 98.5 F (36.9 C) 98.6 F (37 C)  TempSrc: Oral Oral Oral Oral  SpO2: 99% 98% 99% 97%  Weight:      Height:        CBC:  Recent Labs  Lab 01/16/19 0923 01/16/19 0952 01/16/19 1713  WBC 7.4  --  6.5  NEUTROABS 4.6  --   --   HGB 15.6* 15.3* 15.1*  HCT 46.0 45.0 45.3  MCV 95.8  --  97.0  PLT 339  --  348    Basic Metabolic Panel:  Recent Labs  Lab 01/16/19 0923 01/16/19 0952 01/16/19 1713  NA 140 141  --   K 4.0 3.8  --   CL 105 107  --   CO2 24  --   --   GLUCOSE 101* 98  --   BUN 8 9  --   CREATININE 0.68 0.50 0.83  CALCIUM 9.0  --   --     Lipid Panel:     Component Value Date/Time   CHOL 142 01/17/2019 0635   TRIG 275 (H) 01/17/2019 0635   HDL 35 (L) 01/17/2019 0635   CHOLHDL 4.1 01/17/2019 0635   VLDL 55 (H) 01/17/2019 0635   LDLCALC 52 01/17/2019 0635   HgbA1c:  Lab Results  Component Value Date   HGBA1C 5.5 01/17/2019   Urine Drug Screen:     Component Value Date/Time   LABOPIA NONE DETECTED 01/17/2019 0409   COCAINSCRNUR POSITIVE (A) 01/17/2019 0409   LABBENZ POSITIVE (A) 01/17/2019 0409   AMPHETMU NONE DETECTED 01/17/2019 0409   THCU NONE DETECTED 01/17/2019 0409   LABBARB NONE DETECTED 01/17/2019 0409    Alcohol Level     Component Value Date/Time   ETH <10 01/16/2019 0923    IMAGING  CTA head:  1. No intracranial large vessel occlusion or proximal high-grade arterial stenosis.  2. Mild intracranial atherosclerotic disease as described.  3. Extensive partial opacification of the right nasal passage which may reflect secretions or polypoid soft tissue. Correlate  clinically and consider direct visualization.   CTA neck:  1. Common carotid, internal carotid and vertebral arteries patent within the neck bilaterally without significant stenosis (50% or greater). Mild atherosclerosis within the visualized aortic arch.  2. Medialized course of the common carotid arteries bilaterally. Partially retropharyngeal course of the right ICA.  3. Medialized course of the caudal left internal jugular vein.   CT head:  Study within normal limits.   CT cervical spine:  Areas of osteoarthritic change, primarily at C4-5 and C5-6. No fracture or spondylolisthesis. Apical lung scarring noted.    Mr Brain Wo Contrast 01/16/2019 IMPRESSION:  Small acute infarct posterior limb internal capsule on the left Mild chronic microvascular ischemic change in the white matter and pons.   Transthoracic Echocardiogram  Pending  ECG - SR rate 70 BPM. LVH (See cardiology reading for complete details)   PHYSICAL EXAM  Temp:  [98.5 F (36.9 C)-98.8 F (37.1 C)] 98.6 F (37 C) (10/03 1140) Pulse Rate:  [64-97] 97 (10/03 1140) Resp:  [17-20]  18 (10/03 1140) BP: (154-191)/(84-108) 177/101 (10/03 1140) SpO2:  [96 %-99 %] 97 % (10/03 1140) Weight:  [75.4 kg] 75.4 kg (10/02 2313)  General - Well nourished, well developed, in no apparent distress.  Ophthalmologic - fundi not visualized due to noncooperation.  Cardiovascular - Regular rhythm and rate.  Mental Status -  Level of arousal and orientation to time, place, and person were intact. Language including expression, naming, repetition, comprehension was assessed and found intact. Mild dysarthria Fund of Knowledge was assessed and was intact.  Cranial Nerves II - XII - II - Visual field intact OU. III, IV, VI - Extraocular movements intact. V - Facial sensation intact bilaterally. VII - right nasolabial fold flattening. VIII - Hearing & vestibular intact bilaterally. X - Palate elevates symmetrically. Mild  dysarthria, poor dentation  XI - Chin turning & shoulder shrug intact bilaterally. XII - Tongue protrusion intact.  Motor Strength - The patient's strength was normal in left UE and LE, however, right UE 4/5 and LE 4+/5 and pronator drift was present on the right.  Bulk was normal and fasciculations were absent.   Motor Tone - Muscle tone was assessed at the neck and appendages and was normal.  Reflexes - The patient's reflexes were symmetrical in all extremities and she had no pathological reflexes.  Sensory - Light touch, temperature/pinprick were assessed and were symmetrical.    Coordination - The patient had normal movements in the hands with no ataxia or dysmetria, but slower on the right.  Tremor was absent.  Gait and Station - deferred.    ASSESSMENT/PLAN [name] with history of HTN, HLD, sciatica and tobacco use presenting with balance issues, weakness, dizziness s/p fall today. She did not receive IV t-PA due to late presentation (>4.5 hours from time of onset).   Stroke:  Small acute infarct posterior limb internal capsule on the left - small vessel disease source.  Code Stroke CT Head - not ordered  CT head - Study within normal limits.   MRI head - Small acute infarct posterior limb internal capsule on the left Mild chronic microvascular ischemic change in the white matter and pons.  CTA H&N - No intracranial large vessel occlusion or proximal high-grade arterial stenosis.  2D Echo - pending  Lacey Jensen Virus 2 - negative  LDL - 52  HgbA1c - 5.5  UDS - positive for cocaine  VTE prophylaxis - Lovenox  No antithrombotic prior to admission, now on aspirin 81 mg daily and clopidogrel 75 mg daily. Continue DAPT for 3 weeks and then ASA alone.   Patient counseled to be compliant with her antithrombotic medications  Ongoing aggressive stroke risk factor management  Therapy recommendations:  pending  Disposition:   Pending  Hypertension  Home BP meds: Norvasc ; HCTZ ; Lisinopril  Current BP meds: none  Blood pressure at higher end . Permissive hypertension (OK if < 220/120) but gradually normalize in 5-7 days  . Long-term BP goal normotensive  Hyperlipidemia  Home Lipid lowering medication:  none  LDL 52, goal < 70  Current lipid lowering medication: Lipitor 40 mg daily   Continue statin at discharge  Cocaine abuse  Pt stated that she recently relapsed after clean for 29 years  She had several family member death in the family recently that made her relapse  She is willing to quit  Cocaine cessation education provided.  Tobacco abuse  Current smoker  Smoking cessation counseling provided  Pt is willing to quit  Other Stroke Risk Factors  ETOH use, advised to drink no more than 1 alcoholic beverage per day.  Other Active Problems  Extensive partial opacification of the right nasal passage which may reflect secretions or polypoid soft tissue. Correlate clinically and consider direct visualization.  Hospital day # 1  Neurology will sign off. Please call with questions. [facility]. Thanks for the consult.  Marvel PlanJindong Amila Callies, MD PhD Stroke Neurology 01/17/2019 1:31 PM    To contact Stroke Continuity provider, please refer to [email or web address]. After hours, contact General Neurology

## 2019-01-17 NOTE — Evaluation (Signed)
Occupational Therapy Evaluation Patient Details Name: Katrina Schmidt MRN: [id number] DOB: 1961/01/09 Today's Date: 01/17/2019    History of Present Illness Pt is a 58 y/o female admitted secondary to R sided weakness. 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 Pt positive for cocaine. PMH including but not limited to anxiety and HTN.   Clinical Impression   Pt admitted with above diagnoses, with R sided deficits and cognition changes limiting ability to engage in BADL at desired level of ind. PTA pt mod I with rollator for long distances, mild assist for IADL in the home. At time of eval pt is min A for bed mobility and max A for sit <> stand transfers. Pt with decreased awareness of deficits, trying to stand and turn around to brush crumbs off of bed- requiring OT to bear hug and max A support hips while pt in squatting position. Also needing R knee block in standing to prevent buckling. Pt presents with RUE (dominant) coordination deficits. Worked on grasp release tasks in different planes with cup while seated EOB. Educated on practicing finger to nose and using RUE as much as possible with BADL. Will continue to assess vision due to complaints of blurriness. At this time, recommend CIR for cont intensive neurorehab. Pt will benefit from acute OT at POC  listed below.    Follow Up Recommendations  CIR;Supervision/Assistance - 24 hour    Equipment Recommendations  Other (comment)(TBD)    Recommendations for Other Services       Precautions / Restrictions Precautions Precautions: Fall Restrictions Weight Bearing Restrictions: No      Mobility Bed Mobility Overal bed mobility: Needs Assistance Bed Mobility: Supine to Sit;Sit to Supine     Supine to sit: Min assist Sit to supine: Min assist   General bed mobility comments: increased time and effort, min A for RLE translation to EOB and to move hips square with EOB  Transfers Overall  transfer level: Needs assistance Equipment used: 1 person hand held assist Transfers: Sit to/from Stand Sit to Stand: Max assist         General transfer comment: R knee blocked to prevent buckle, max A to rise and steady; difficulty maintaining posture    Balance Overall balance assessment: Needs assistance Sitting-balance support: Feet supported Sitting balance-Leahy Scale: Fair Sitting balance - Comments: supervision for safety with static sitting balance   Standing balance support: Single extremity supported Standing balance-Leahy Scale: Poor Standing balance comment: mod-max A needed                           ADL either performed or assessed with clinical judgement   ADL Overall ADL's : Needs assistance/impaired Eating/Feeding: Minimal assistance;Sitting Eating/Feeding Details (indicate cue type and reason): RUE uncoordinated Grooming: Minimal assistance;Sitting Grooming Details (indicate cue type and reason): requires max A to maintain standing at this time Upper Body Bathing: Minimal assistance;Sitting   Lower Body Bathing: Moderate assistance;Sit to/from stand;Sitting/lateral leans   Upper Body Dressing : Minimal assistance;Sitting   Lower Body Dressing: Moderate assistance;Sitting/lateral leans;Sit to/from stand   Toilet Transfer: Maximal assistance;BSC;RW   Toileting- Clothing Manipulation and Hygiene: Minimal assistance;Sit to/from stand;Sitting/lateral lean       Functional mobility during ADLs: Maximal assistance;Cueing for safety(sit <> stand with side steps only) General ADL Comments: pt ltd by R sided weakness and decresaed awareness of safety/deficits     Vision Baseline Vision/History: Wears glasses Wears  Glasses: Reading only Patient Visual Report: Blurring of vision Vision Assessment?: Yes Eye Alignment: Within Functional Limits Ocular Range of Motion: Within Functional Limits Alignment/Gaze Preference: Within Defined  Limits Tracking/Visual Pursuits: Able to track stimulus in all quads without difficulty Saccades: Impaired - to be further tested in functional context Convergence: Within functional limits Visual Fields: No apparent deficits Additional Comments: reports mild dizziness and less sharp vision at times; will need to continue to assess     Perception     Praxis      Pertinent Vitals/Pain Pain Assessment: No/denies pain     Hand Dominance Right   Extremity/Trunk Assessment Upper Extremity Assessment Upper Extremity Assessment: RUE deficits/detail RUE Deficits / Details: 2/5 shoulder flextion; 4/5 elbow flexon; grip strength in tact but very uncoordinated RUE Coordination: decreased fine motor;decreased gross motor   Lower Extremity Assessment Lower Extremity Assessment: Defer to PT evaluation RLE Deficits / Details: MMT revealed 2/5 for hip flexion, knee extension and 3/5 for knee flexion; pt reported sensation as normal RLE Coordination: decreased gross motor;decreased fine motor   Cervical / Trunk Assessment Cervical / Trunk Assessment: Normal   Communication Communication Communication: No difficulties   Cognition Arousal/Alertness: Awake/alert Behavior During Therapy: WFL for tasks assessed/performed Overall Cognitive Status: Impaired/Different from baseline Area of Impairment: Safety/judgement                         Safety/Judgement: Decreased awareness of safety;Decreased awareness of deficits     General Comments: decreased awareness of defcits/safety; attempting to stand up and brush crumbs off of bed despite needing max A to stand   General Comments       Exercises     Shoulder Instructions      Home Living Family/patient expects to be discharged to:: Private residence Living Arrangements: Spouse/significant other Available Help at Discharge: Family;Available 24 hours/day Type of Home: House Home Access: Level entry     Home Layout: One  level     Bathroom Shower/Tub: Tub/shower unit         Home Equipment: Walker - 4 wheels;Cane - single point          Prior Functioning/Environment Level of Independence: Independent with assistive device(s)        Comments: uses rollator or can when out in community; completes IADL with some assist, ind in BADL        OT Problem List: Decreased strength;Decreased knowledge of use of DME or AE;Impaired vision/perception;Decreased coordination;Decreased knowledge of precautions;Decreased range of motion;Decreased activity tolerance;Impaired UE functional use;Decreased cognition;Impaired balance (sitting and/or standing);Decreased safety awareness      OT Treatment/Interventions: Self-care/ADL training;Visual/perceptual remediation/compensation;Therapeutic exercise;Patient/family education;Neuromuscular education;Balance training;Therapeutic activities;DME and/or AE instruction;Cognitive remediation/compensation    OT Goals(Current goals can be found in the care plan section) Acute Rehab OT Goals Patient Stated Goal: be ind OT Goal Formulation: With patient Time For Goal Achievement: 01/31/19 Potential to Achieve Goals: Good  OT Frequency: Min 2X/week   Barriers to D/C:            Co-evaluation              AM-PAC OT "6 Clicks" Daily Activity     Outcome Measure Help from another person eating meals?: A Little Help from another person taking care of personal grooming?: A Little Help from another person toileting, which includes using toliet, bedpan, or urinal?: A Lot Help from another person bathing (including washing, rinsing, drying)?: A Lot Help from another person to put  on and taking off regular upper body clothing?: A Little Help from another person to put on and taking off regular lower body clothing?: A Lot 6 Click Score: 15   End of Session Equipment Utilized During Treatment: Gait belt Nurse Communication: Mobility status  Activity Tolerance:  Patient tolerated treatment well Patient left: in bed;with call bell/phone within reach;with bed alarm set  OT Visit Diagnosis: Unsteadiness on feet (R26.81);Other abnormalities of gait and mobility (R26.89);Muscle weakness (generalized) (M62.81);Other symptoms and signs involving the nervous system (R29.898);Hemiplegia and hemiparesis Hemiplegia - Right/Left: Right Hemiplegia - dominant/non-dominant: Dominant Hemiplegia - caused by: Cerebral infarction                Time: 2637-8588 OT Time Calculation (min): 21 min Charges:  OT General Charges $OT Visit: 1 Visit OT Evaluation $OT Eval Moderate Complexity: 1 Mod OT Treatments $Neuromuscular Re-education: 8-22 mins  Zenovia Jarred, MSOT, OTR/L Behavioral Health OT/ Acute Relief OT [facility] Office: [phone]   Zenovia Jarred 01/17/2019, 1:41 PM

## 2019-01-17 NOTE — Evaluation (Signed)
Physical Therapy Evaluation Patient Details Name: Katrina Schmidt MRN: [id number] DOB: March 29, 1961 Today's Date: 01/17/2019   History of Present Illness  Pt is a 58 y/o female admitted secondary to R sided weakness. 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 Pt positive for cocaine. PMH including but not limited to anxiety and HTN.    Clinical Impression  Pt presented supine in bed with HOB elevated, awake and willing to participate in therapy session. Prior to admission, pt reported that she was independent with ADLs and ambulated with use of either a rollator or a cane when outside of her home (inside could ambulate without an AD). Pt lives with her husband in a single level house with a level entry. She will have 24/7 supervision/assistance upon d/c home. At the time of evaluation, pt presenting with significant weakness of her R upper and lower extremities. She required mod-max A for transfers and attempted side steps at EOB. Pt is an excellent candidate for further intensive therapy services at CIR to maximize her independence with functional mobility prior to returning home with family support. PT will continue to follow pt acutely to progress mobility as tolerated.     Follow Up Recommendations CIR    Equipment Recommendations  None recommended by PT    Recommendations for Other Services Rehab consult     Precautions / Restrictions Precautions Precautions: Fall Restrictions Weight Bearing Restrictions: No      Mobility  Bed Mobility Overal bed mobility: Needs Assistance Bed Mobility: Supine to Sit;Sit to Supine     Supine to sit: Min guard Sit to supine: Min guard   General bed mobility comments: increased time and effort needed, HOB slightly elevated, min guard for safety; pt achieving upright sitting towards her L side  Transfers Overall transfer level: Needs assistance Equipment used: 1 person hand held assist Transfers: Sit  to/from Stand Sit to Stand: Max assist         General transfer comment: PT blocking R knee to prevent buckling, excessive forward weight shift with transition, cueing for more upright posture to achieve full standing position  Ambulation/Gait             General Gait Details: attempted to take side steps at EOB; pt able to advance L LE laterally towards HOB and then slid R foot to midline; required mod-max A to maintain standing as bilateral knees buckling throughout  Stairs            Wheelchair Mobility    Modified Rankin (Stroke Patients Only) Modified Rankin (Stroke Patients Only) Pre-Morbid Rankin Score: Moderate disability Modified Rankin: Moderately severe disability     Balance Overall balance assessment: Needs assistance Sitting-balance support: Feet supported Sitting balance-Leahy Scale: Fair Sitting balance - Comments: supervision for safety with static sitting balance   Standing balance support: Single extremity supported Standing balance-Leahy Scale: Poor Standing balance comment: mod-max A needed                             Pertinent Vitals/Pain Pain Assessment: No/denies pain    Home Living Family/patient expects to be discharged to:: Private residence Living Arrangements: Spouse/significant other Available Help at Discharge: Family;Available 24 hours/day Type of Home: House Home Access: Level entry     Home Layout: One level Home Equipment: Walker - 4 wheels;Cane - single point      Prior Function Level of Independence: Independent with assistive  device(s)         Comments: pt reported that she ambulates with either a rollator or can when outside of her home; she ambulates within her home without use of an AD     Hand Dominance   Dominant Hand: Right    Extremity/Trunk Assessment   Upper Extremity Assessment Upper Extremity Assessment: Defer to OT evaluation;RUE deficits/detail RUE Deficits / Details: Pt with 2/5  strength for shoulder flexion, 3/5 for elbow flexion/extension; pt with greatly diminished grip strength as compared to her L RUE Coordination: decreased fine motor    Lower Extremity Assessment Lower Extremity Assessment: RLE deficits/detail RLE Deficits / Details: MMT revealed 2/5 for hip flexion, knee extension and 3/5 for knee flexion; pt reported sensation as normal RLE Coordination: decreased gross motor;decreased fine motor    Cervical / Trunk Assessment Cervical / Trunk Assessment: Normal  Communication   Communication: No difficulties  Cognition Arousal/Alertness: Awake/alert Behavior During Therapy: WFL for tasks assessed/performed Overall Cognitive Status: Within Functional Limits for tasks assessed                                        General Comments      Exercises     Assessment/Plan    PT Assessment Patient needs continued PT services  PT Problem List Decreased strength;Decreased range of motion;Decreased activity tolerance;Decreased balance;Decreased mobility;Decreased coordination;Decreased knowledge of use of DME;Decreased safety awareness;Decreased knowledge of precautions       PT Treatment Interventions DME instruction;Gait training;Functional mobility training;Therapeutic activities;Stair training;Therapeutic exercise;Balance training;Neuromuscular re-education;Patient/family education    PT Goals (Current goals can be found in the Care Plan section)  Acute Rehab PT Goals Patient Stated Goal: to get stronger PT Goal Formulation: With patient Time For Goal Achievement: 01/31/19 Potential to Achieve Goals: Good    Frequency Min 4X/week   Barriers to discharge        Co-evaluation               AM-PAC PT "6 Clicks" Mobility  Outcome Measure Help needed turning from your back to your side while in a flat bed without using bedrails?: A Little Help needed moving from lying on your back to sitting on the side of a flat bed  without using bedrails?: A Little Help needed moving to and from a bed to a chair (including a wheelchair)?: A Lot Help needed standing up from a chair using your arms (e.g., wheelchair or bedside chair)?: A Lot Help needed to walk in hospital room?: Total Help needed climbing 3-5 steps with a railing? : Total 6 Click Score: 12    End of Session Equipment Utilized During Treatment: Gait belt Activity Tolerance: Patient tolerated treatment well Patient left: in bed;with call bell/phone within reach;with bed alarm set Nurse Communication: Mobility status PT Visit Diagnosis: Other abnormalities of gait and mobility (R26.89);Other symptoms and signs involving the nervous system (R29.898)    Time: 6160-7371 PT Time Calculation (min) (ACUTE ONLY): 31 min   Charges:   PT Evaluation $PT Eval Moderate Complexity: 1 Mod PT Treatments $Therapeutic Activity: 8-22 mins        Sherie Don, PT, DPT  Acute Rehabilitation Services Pager [phone] Office [place] 01/17/2019, 10:23 AM

## 2019-01-17 NOTE — Progress Notes (Signed)
Rehab Admissions Coordinator Note:  Per PT recommendation, this patient was screened by Raechel Ache for appropriateness for an Inpatient Acute Rehab Consult.  At this time, we are recommending Inpatient Rehab consult. AC will contact MD to request order.   Raechel Ache 01/17/2019, 11:04 AM  I can be reached at [phone].

## 2019-01-17 NOTE — Evaluation (Signed)
Speech Language Pathology Evaluation Patient Details Name: Katrina Schmidt MRN: [id number] DOB: October 25, 1960 Today's Date: 01/17/2019 Time: 7035-0093 SLP Time Calculation (min) (ACUTE ONLY): 20 min  Problem List:  Patient Active Problem List   Diagnosis Date Noted  . CVA (cerebral vascular accident) ([place]) 01/16/2019  . Hypertension 01/16/2019  . Hyperlipidemia 01/16/2019  . Tobacco abuse 01/16/2019  . Alcohol abuse 01/16/2019  . Sciatica 01/16/2019  . Panic attacks 01/16/2019   Past Medical History:  Past Medical History:  Diagnosis Date  . Anxiety   . Chronic bronchitis ([place])   . DDD (degenerative disc disease), lumbosacral   . DJD (degenerative joint disease)   . Hyperlipidemia   . Hypertension   . Sciatica    Past Surgical History:  Past Surgical History:  Procedure Laterality Date  . HERNIA REPAIR     HPI:  Pt is a 58 y/o female admitted secondary to R sided weakness. 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 Pt positive for cocaine. PMH including but not limited to anxiety and HTN.   Assessment / Plan / Recommendation Clinical Impression  Patient presents with a mild-moderate cognitive-linguistic impairment however based on her history of drug and alcohol abuse, cannot r/o that she is at or near baseline. She exhibited poor awareness to deficits, poor safety awareness, poor problem solving related to deficits and current level of function and impulsivity. She was able to describe her current difficulty in using her right hand, but then when putting away some candies, she dropped most on the floor as she was not watching as she put them in drawer. She is a safety risk to herself and others.    SLP Assessment  SLP Recommendation/Assessment: Patient needs continued Speech Lanaguage Pathology Services SLP Visit Diagnosis: Cognitive communication deficit (R41.841)    Follow Up Recommendations  Other (comment)(TBD)    Frequency and  Duration min 2x/week  1 week      SLP Evaluation Cognition  Overall Cognitive Status: Impaired/Different from baseline Orientation Level: Oriented X4 Attention: Alternating Alternating Attention: Impaired Alternating Attention Impairment: Verbal complex;Functional basic Memory: Appears intact Awareness: Impaired Awareness Impairment: Emergent impairment Problem Solving: Impaired Problem Solving Impairment: Functional basic Executive Function: Self Monitoring Self Monitoring: Impaired Self Monitoring Impairment: Functional basic Safety/Judgment: Impaired       Comprehension  Auditory Comprehension Overall Auditory Comprehension: Appears within functional limits for tasks assessed    Expression Expression Primary Mode of Expression: Verbal Verbal Expression Overall Verbal Expression: Appears within functional limits for tasks assessed   Oral / Motor  Oral Motor/Sensory Function Overall Oral Motor/Sensory Function: Within functional limits Motor Speech Overall Motor Speech: Appears within functional limits for tasks assessed   GO                    Dannial Monarch 01/17/2019, 6:12 PM   Sonia Baller, MA, CCC-SLP Speech Therapy [facility] Acute Rehab Pager: [phone]

## 2019-01-17 NOTE — Progress Notes (Signed)
PROGRESS NOTE    Katrina BogusVeronica E Hardt  OZH:[id number] DOB: [birth year] DOA: 01/16/2019 PCP: System, Provider Not In   Brief Narrative: Katrina Schmidt is a 58 y.o. female with medical history significant of hypertension, hyperlipidemia, degenerative disc disease, sciatica, panic attacks, tobacco abuse, alcohol abuse. Patient presented secondary to syncope/right sided weakness found to have a left internal capsule stroke.   Assessment & Plan:   Principal Problem:   CVA (cerebral vascular accident) (HCC) Active Problems:   Hypertension   Hyperlipidemia   Tobacco abuse   Alcohol abuse   Sciatica   Panic attacks   Acute stroke Posterior limb of left internal capsule. CT head normal MRI significant for acute stroke as mentioned with associated mild chronic microvascular ischemic change in white matter/pons. LDL of 52, hemoglobin A1C of 5.5%. Cocaine positive. -Transthoracic Echocardiogram pending -Neurology recommendations: aspirin 81 mg daily and clopidogrel 75 mg daily; DAPT x3 weeks followed by aspirin alone  Essential hypertension Patient is on hydrochlorothiazide, amlodipine and lisinopril as an outpatient. Currently held secondary to permissive hypertension.  Hyperlipidemia Patient started on Lipitor 40 mg daily  Cocaine abuse Patient states she relapsed after 29 years after the death of three family members.  Tobacco abuse Current half pack per day smoker. Cessation discussed. Patient is encouraged to quit.   DVT prophylaxis: Lovenox Code Status:   Code Status: Full Code Family Communication: None at bedside Disposition Plan: Discharge to CIR   Consultants:   Neurology/Stroke  Procedures:   Transthoracic Echocardiogram pending  Antimicrobials:  None    Subjective: No issues overnight  Objective: Vitals:   01/16/19 2338 01/17/19 0330 01/17/19 0800 01/17/19 1140  BP: (!) 164/96 (!) 157/84 (!) 154/93 (!) 177/101  Pulse: 92 64 79 97  Resp: 18 20 19 18     Temp: 98.8 F (37.1 C) 98.5 F (36.9 C) 98.5 F (36.9 C) 98.6 F (37 C)  TempSrc: Oral Oral Oral Oral  SpO2: 99% 98% 99% 97%  Weight:      Height:       No intake or output data in the 24 hours ending 01/17/19 1405 Filed Weights   01/16/19 2313  Weight: 75.4 kg    Examination:  General exam: Appears calm and comfortable HEENT: poor dentition Respiratory system: Clear to auscultation. Respiratory effort normal. Cardiovascular system: S1 & S2 heard, RRR. No murmurs, rubs, gallops or clicks. Gastrointestinal system: Abdomen is nondistended, soft and nontender. No organomegaly or masses felt. Normal bowel sounds heard. Central nervous system: Alert and oriented. 3/5 right sided strength compared to 4-5/5 on left. Decreased reflexes on right Extremities: No edema. No calf tenderness Skin: No cyanosis. No rashes Psychiatry: Judgement and insight appear normal. Mood & affect appropriate.     Data Reviewed: I have personally reviewed following labs and imaging studies  CBC: Recent Labs  Lab 01/16/19 0923 01/16/19 0952 01/16/19 1713  WBC 7.4  --  6.5  NEUTROABS 4.6  --   --   HGB 15.6* 15.3* 15.1*  HCT 46.0 45.0 45.3  MCV 95.8  --  97.0  PLT 339  --  348   Basic Metabolic Panel: Recent Labs  Lab 01/16/19 0923 01/16/19 0952 01/16/19 1713  NA 140 141  --   K 4.0 3.8  --   CL 105 107  --   CO2 24  --   --   GLUCOSE 101* 98  --   BUN 8 9  --   CREATININE 0.68 0.50 0.83  CALCIUM 9.0  --   --  GFR: Estimated Creatinine Clearance: 74.2 mL/min (by C-G formula based on SCr of 0.83 mg/dL). Liver Function Tests: Recent Labs  Lab 01/16/19 0923  AST 22  ALT 25  ALKPHOS 64  BILITOT 0.7  PROT 6.5  ALBUMIN 3.8   No results for input(s): LIPASE, AMYLASE in the last 168 hours. No results for input(s): AMMONIA in the last 168 hours. Coagulation Profile: Recent Labs  Lab 01/16/19 0923  INR 1.1   Cardiac Enzymes: No results for input(s): CKTOTAL, CKMB,  CKMBINDEX, TROPONINI in the last 168 hours. BNP (last 3 results) No results for input(s): PROBNP in the last 8760 hours. HbA1C: Recent Labs    01/17/19 0635  HGBA1C 5.5   CBG: Recent Labs  Lab 01/16/19 0847  GLUCAP 105*   Lipid Profile: Recent Labs    01/17/19 0635  CHOL 142  HDL 35*  LDLCALC 52  TRIG 254*  CHOLHDL 4.1   Thyroid Function Tests: No results for input(s): TSH, T4TOTAL, FREET4, T3FREE, THYROIDAB in the last 72 hours. Anemia Panel: No results for input(s): VITAMINB12, FOLATE, FERRITIN, TIBC, IRON, RETICCTPCT in the last 72 hours. Sepsis Labs: No results for input(s): PROCALCITON, LATICACIDVEN in the last 168 hours.  Recent Results (from the past 240 hour(s))  SARS CORONAVIRUS 2 (TAT 6-24 HRS) Nasopharyngeal Nasopharyngeal Swab     Status: None   Collection Time: 01/16/19  3:16 PM   Specimen: Nasopharyngeal Swab  Result Value Ref Range Status   SARS Coronavirus 2 NEGATIVE NEGATIVE Final    Comment: (NOTE) SARS-CoV-2 target nucleic acids are NOT DETECTED. The SARS-CoV-2 RNA is generally detectable in upper and lower respiratory specimens during the acute phase of infection. Negative results do not preclude SARS-CoV-2 infection, do not rule out co-infections with other pathogens, and should not be used as the sole basis for treatment or other patient management decisions. Negative results must be combined with clinical observations, patient history, and epidemiological information. The expected result is Negative. Fact Sheet for Patients: HairSlick.no Fact Sheet for Healthcare Providers: quierodirigir.com This test is not yet approved or cleared by the [place] FDA and  has been authorized for detection and/or diagnosis of SARS-CoV-2 by FDA under an Emergency Use Authorization (EUA). This EUA will remain  in effect (meaning this test can be used) for the duration of the COVID-19 declaration  under Section 56 4(b)(1) of the Act, 21 U.S.C. section 360bbb-3(b)(1), unless the authorization is terminated or revoked sooner. Performed at [facility], [address] [place], [place] [address]          Radiology Studies: Ct Angio Head W Or Wo Contrast  Result Date: 01/16/2019 CLINICAL DATA:  Stroke, follow-up. EXAM: CT ANGIOGRAPHY HEAD AND NECK TECHNIQUE: Multidetector CT imaging of the head and neck was performed using the standard protocol during bolus administration of intravenous contrast. Multiplanar CT image reconstructions and MIPs were obtained to evaluate the vascular anatomy. Carotid stenosis measurements (when applicable) are obtained utilizing NASCET criteria, using the distal internal carotid diameter as the denominator. CONTRAST:  75 mL OMNIPAQUE IOHEXOL 350 MG/ML SOLN COMPARISON:  Brain MRI 01/16/2019, head CT 01/16/2019 FINDINGS: CTA NECK FINDINGS Aortic arch: Common origin of the innominate and left common carotid arteries. Included portions of the aortic arch demonstrate no evidence of dissection or aneurysm. Right carotid system: CCA and ICA patent within the neck without significant stenosis (50% or greater). No significant atherosclerosis. Medialized course of the common carotid artery with partially retropharyngeal course of the ICA. Left carotid system:  CCA and ICA patent within the neck without significant stenosis (50% or greater). No significant atherosclerosis. Medialized course of the common carotid artery. Vertebral arteries: Left vertebral artery dominant. The vertebral arteries are patent within the neck bilaterally without significant stenosis (50% or greater). No significant atherosclerotic disease. Skeleton: No acute bony abnormality. Cervical spondylosis. C5-C6 moderate disc height loss with small posterior disc osteophyte complex. Other neck: No soft tissue neck mass Upper chest: No focal consolidation within the imaged lung apices. Paraseptal  emphysema. Other: Medialized course of the caudal left internal jugular vein. Review of the MIP images confirms the above findings CTA HEAD FINDINGS Anterior circulation: Mild atherosclerotic plaque within the bilateral intracranial internal carotid arteries without significant stenosis. The right middle cerebral artery is patent without significant proximal stenosis. The A1 right anterior cerebral artery appears severely hypoplastic or aplastic. The bilateral anterior cerebral arteries are patent and predominantly supplied by the A1 left ACA. The A1 left ACA is patent without significant stenosis. The left middle cerebral artery is patent without significant proximal stenosis. No intracranial aneurysm is identified. Posterior circulation: The intracranial vertebral arteries are patent without significant stenosis. Minimal atherosclerotic calcification within the intracranial left vertebral artery. The basilar artery is patent without significant stenosis. The bilateral posterior cerebral arteries are patent without significant proximal stenosis. Venous sinuses: Within limitations of contrast timing, no convincing thrombus. Anatomic variants: As described Other: Extensive partial opacification of the right nasal passage which may reflect secretions or polypoid soft tissue. Review of the MIP images confirms the above findings IMPRESSION: CTA head: 1. No intracranial large vessel occlusion or proximal high-grade arterial stenosis. 2. Mild intracranial atherosclerotic disease as described. 3. Extensive partial opacification of the right nasal passage which may reflect secretions or polypoid soft tissue. Correlate clinically and consider direct visualization. CTA neck: 1. Common carotid, internal carotid and vertebral arteries patent within the neck bilaterally without significant stenosis (50% or greater). Mild atherosclerosis within the visualized aortic arch. 2. Medialized course of the common carotid arteries  bilaterally. Partially retropharyngeal course of the right ICA. 3. Medialized course of the caudal left internal jugular vein. Electronically Signed   By: Jackey Loge   On: 01/16/2019 21:30   Ct Head Wo Contrast  Result Date: 01/16/2019 CLINICAL DATA:  Pain following fall.  Lower extremity weakness EXAM: CT HEAD WITHOUT CONTRAST CT CERVICAL SPINE WITHOUT CONTRAST TECHNIQUE: Multidetector CT imaging of the head and cervical spine was performed following the standard protocol without intravenous contrast. Multiplanar CT image reconstructions of the cervical spine were also generated. COMPARISON:  None. FINDINGS: CT HEAD FINDINGS Brain: Ventricles are normal in size and configuration. There is no intracranial mass, hemorrhage, extra-axial fluid collection, or midline shift. The brain parenchyma appears unremarkable. No evident acute infarct. Vascular: There is no hyperdense vessel. No appreciable vascular calcification. Skull: Bony calvarium appears intact. Sinuses/Orbits: Visualized paranasal sinuses are clear. Visualized orbits appear symmetric bilaterally. Other: Mastoid air cells are clear. CT CERVICAL SPINE FINDINGS Alignment: There is no demonstrable spondylolisthesis. Skull base and vertebrae: Skull base and craniocervical junction regions appear normal. No fracture evident. There are no blastic or lytic bone lesions. Soft tissues and spinal canal: Prevertebral soft tissues and predental space regions are normal. No cord or canal hematoma. No paraspinous lesions. Disc levels: There is moderate disc space narrowing at C5-6 and to a slightly lesser extent at C4-5. Other disc spaces appear unremarkable. There is facet hypertrophy at C4-5 and C5-6. No disc extrusion or stenosis. No appreciable nerve root edema  or effacement. Upper chest: There is scarring in the apices, slightly more on the right than on the left. Visualized upper lung regions are clear. Other: None IMPRESSION: CT head: Study within normal  limits. 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 No fracture or spondylolisthesis. Apical lung scarring noted. Electronically Signed   By: Lowella Grip III M.D.   On: 01/16/2019 10:15   Ct Angio Neck W Or Wo Contrast  Result Date: 01/16/2019 CLINICAL DATA:  Stroke, follow-up. EXAM: CT ANGIOGRAPHY HEAD AND NECK TECHNIQUE: Multidetector CT imaging of the head and neck was performed using the standard protocol during bolus administration of intravenous contrast. Multiplanar CT image reconstructions and MIPs were obtained to evaluate the vascular anatomy. Carotid stenosis measurements (when applicable) are obtained utilizing NASCET criteria, using the distal internal carotid diameter as the denominator. CONTRAST:  75 mL OMNIPAQUE IOHEXOL 350 MG/ML SOLN COMPARISON:  Brain MRI 01/16/2019, head CT 01/16/2019 FINDINGS: CTA NECK FINDINGS Aortic arch: Common origin of the innominate and left common carotid arteries. Included portions of the aortic arch demonstrate no evidence of dissection or aneurysm. Right carotid system: CCA and ICA patent within the neck without significant stenosis (50% or greater). No significant atherosclerosis. Medialized course of the common carotid artery with partially retropharyngeal course of the ICA. Left carotid system: CCA and ICA patent within the neck without significant stenosis (50% or greater). No significant atherosclerosis. Medialized course of the common carotid artery. Vertebral arteries: Left vertebral artery dominant. The vertebral arteries are patent within the neck bilaterally without significant stenosis (50% or greater). No significant atherosclerotic disease. Skeleton: No acute bony abnormality. Cervical spondylosis. C5-C6 moderate disc height loss with small posterior disc osteophyte complex. Other neck: No soft tissue neck mass Upper chest: No focal consolidation within the imaged lung apices. Paraseptal emphysema. Other:  Medialized course of the caudal left internal jugular vein. Review of the MIP images confirms the above findings CTA HEAD FINDINGS Anterior circulation: Mild atherosclerotic plaque within the bilateral intracranial internal carotid arteries without significant stenosis. The right middle cerebral artery is patent without significant proximal stenosis. The A1 right anterior cerebral artery appears severely hypoplastic or aplastic. The bilateral anterior cerebral arteries are patent and predominantly supplied by the A1 left ACA. The A1 left ACA is patent without significant stenosis. The left middle cerebral artery is patent without significant proximal stenosis. No intracranial aneurysm is identified. Posterior circulation: The intracranial vertebral arteries are patent without significant stenosis. Minimal atherosclerotic calcification within the intracranial left vertebral artery. The basilar artery is patent without significant stenosis. The bilateral posterior cerebral arteries are patent without significant proximal stenosis. Venous sinuses: Within limitations of contrast timing, no convincing thrombus. Anatomic variants: As described Other: Extensive partial opacification of the right nasal passage which may reflect secretions or polypoid soft tissue. Review of the MIP images confirms the above findings IMPRESSION: CTA head: 1. No intracranial large vessel occlusion or proximal high-grade arterial stenosis. 2. Mild intracranial atherosclerotic disease as described. 3. Extensive partial opacification of the right nasal passage which may reflect secretions or polypoid soft tissue. Correlate clinically and consider direct visualization. CTA neck: 1. Common carotid, internal carotid and vertebral arteries patent within the neck bilaterally without significant stenosis (50% or greater). Mild atherosclerosis within the visualized aortic arch. 2. Medialized course of the common carotid arteries bilaterally. Partially  retropharyngeal course of the right ICA. 3. Medialized course of the caudal left internal jugular vein. Electronically Signed   By: Kellie Simmering  On: 01/16/2019 21:30   Ct Cervical Spine Wo Contrast  Result Date: 01/16/2019 CLINICAL DATA:  Pain following fall.  Lower extremity weakness EXAM: CT HEAD WITHOUT CONTRAST CT CERVICAL SPINE WITHOUT CONTRAST TECHNIQUE: Multidetector CT imaging of the head and cervical spine was performed following the standard protocol without intravenous contrast. Multiplanar CT image reconstructions of the cervical spine were also generated. COMPARISON:  None. FINDINGS: CT HEAD FINDINGS Brain: Ventricles are normal in size and configuration. There is no intracranial mass, hemorrhage, extra-axial fluid collection, or midline shift. The brain parenchyma appears unremarkable. No evident acute infarct. Vascular: There is no hyperdense vessel. No appreciable vascular calcification. Skull: Bony calvarium appears intact. Sinuses/Orbits: Visualized paranasal sinuses are clear. Visualized orbits appear symmetric bilaterally. Other: Mastoid air cells are clear. CT CERVICAL SPINE FINDINGS Alignment: There is no demonstrable spondylolisthesis. Skull base and vertebrae: Skull base and craniocervical junction regions appear normal. No fracture evident. There are no blastic or lytic bone lesions. Soft tissues and spinal canal: Prevertebral soft tissues and predental space regions are normal. No cord or canal hematoma. No paraspinous lesions. Disc levels: There is moderate disc space narrowing at C5-6 and to a slightly lesser extent at C4-5. Other disc spaces appear unremarkable. There is facet hypertrophy at C4-5 and C5-6. No disc extrusion or stenosis. No appreciable nerve root edema or effacement. Upper chest: There is scarring in the apices, slightly more on the right than on the left. Visualized upper lung regions are clear. Other: None IMPRESSION: CT head: Study within normal limits. CT  cervical spine: Areas of osteoarthritic change, primarily at C4-5 and C5-6. No fracture or spondylolisthesis. Apical lung scarring noted. Electronically Signed   By: Bretta Bang III M.D.   On: 01/16/2019 10:15   Mr Brain Wo Contrast  Result Date: 01/16/2019 CLINICAL DATA:  Focal neuro deficit greater than 6 hours EXAM: MRI HEAD WITHOUT CONTRAST TECHNIQUE: Multiplanar, multiecho pulse sequences of the brain and surrounding structures were obtained without intravenous contrast. COMPARISON:  CT head 01/16/2019 FINDINGS: Brain: Small area of acute infarct in the posterior limb internal capsule on the left. No other acute infarct. Patchy white matter disease bilaterally. Mild patchy hyperintensity in the pons. Negative for hemorrhage or mass. Ventricle size and cerebral volume normal. Vascular: Normal arterial flow voids Skull and upper cervical spine: Negative Sinuses/Orbits: Mild mucosal edema paranasal sinuses.  Normal orbit Other: None IMPRESSION: Small acute infarct posterior limb internal capsule on the left Mild chronic microvascular ischemic change in the white matter and pons. Electronically Signed   By: Marlan Palau M.D.   On: 01/16/2019 15:07        Scheduled Meds:   stroke: mapping our early stages of recovery book   Does not apply Once   aspirin EC  81 mg Oral Daily   atorvastatin  40 mg Oral q1800   clopidogrel  75 mg Oral Daily   enoxaparin (LOVENOX) injection  40 mg Subcutaneous QHS   folic acid  1 mg Oral Daily   thiamine  100 mg Oral Daily   Continuous Infusions:   LOS: 1 day     Jacquelin Hawking, MD [facility] 01/17/2019, 2:05 PM  If 7PM-7AM, please contact night-coverage [email or web address]

## 2019-01-17 NOTE — Progress Notes (Signed)
Pt refused blood draws because she stated she is scared of needles and could only draw her blood by use of the butterfly needle which was not available at the moment.

## 2019-01-18 MED ORDER — HYDROCHLOROTHIAZIDE 25 MG PO TABS: 25.0000 mg | ORAL_TABLET | Freq: Every morning | ORAL | Status: AC

## 2019-01-18 MED ORDER — ASPIRIN 81 MG PO TBEC: 81.0000 mg | DELAYED_RELEASE_TABLET | Freq: Every day | ORAL | 0 refills | Status: DC

## 2019-01-18 MED ORDER — AMLODIPINE BESYLATE 10 MG PO TABS: 10.0000 mg | ORAL_TABLET | Freq: Every morning | ORAL | Status: AC

## 2019-01-18 MED ORDER — CLOPIDOGREL BISULFATE 75 MG PO TABS: 75.0000 mg | ORAL_TABLET | Freq: Every day | ORAL | 0 refills | Status: AC

## 2019-01-18 MED ORDER — LISINOPRIL 40 MG PO TABS: 40.0000 mg | ORAL_TABLET | Freq: Every morning | ORAL | Status: AC

## 2019-01-18 MED ORDER — ATORVASTATIN CALCIUM 40 MG PO TABS: 40.0000 mg | ORAL_TABLET | Freq: Every day | ORAL | 0 refills | Status: DC

## 2019-01-18 NOTE — TOC Transition Note (Signed)
Transition of Care [facility]) - CM/SW Discharge Note   Patient Details  Name: Katrina Schmidt MRN: [id number] Date of Birth: [birth year]  Transition of Care [facility] CM/SW Contact:  Carles Collet, RN Phone Number: 01/18/2019, 3:25 PM   Clinical Narrative:   Patient will transition to home w outpatient therapies. Unable to secure [facility] agency 2/2 to payor source and cocaine use. 3/1 to be delivered to room prior to DC.      Final next level of care: Home/Self Care Barriers to Discharge: No Barriers Identified   Patient Goals and CMS Choice        Discharge Placement                       Discharge Plan and Services                DME Arranged: 3-N-1 DME Agency: [employer] Date DME Agency Contacted: 01/18/19 Time DME Agency Contacted: 1500 Representative spoke with at DME Agency: [place] ([place]) Interventions     Readmission Risk Interventions No flowsheet data found.

## 2019-01-18 NOTE — Progress Notes (Addendum)
Occupational Therapy Treatment Patient Details Name: Katrina Schmidt MRN: [id number] DOB: 07-Jan-1961 Today's Date: 01/18/2019    History of present illness Pt is a 58 y/o female admitted secondary to R sided weakness. 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 Pt positive for cocaine. PMH including but not limited to anxiety and HTN.   OT comments  Patient seated in recliner and eager to participate in OT session, as patient eager to dc home now. She demonstrates ability to complete in room mobility and transfers using RW with min assist to min guard assist, toileting with min guard, LB dressing with min guard, and grooming at sink with min guard.  Educated on use of 3:1 commode for toilet and shower chair.  Daughter present and confirms 24/7 support from daughter and spouse, and will have hands on support during mobility and ADLs as she remains impulsive and requires cueing for safety, pacing. Continues to demonstrate R sided weakness and impaired coordination, but functional during ADL tasks today given increased time. Updated dc recommendations to HHOT.    Follow Up Recommendations  Home health OT;Supervision/Assistance - 24 hour    Equipment Recommendations  3 in 1 bedside commode    Recommendations for Other Services      Precautions / Restrictions Precautions Precautions: Fall Restrictions Weight Bearing Restrictions: No       Mobility Bed Mobility               General bed mobility comments: OOB in recliner upon entry  Transfers Overall transfer level: Needs assistance Equipment used: Rolling walker (2 wheeled) Transfers: Sit to/from Stand Sit to Stand: Min guard         General transfer comment: min guard for safety and balance, cueing for hand placement, technique     Balance Overall balance assessment: Needs assistance Sitting-balance support: Feet supported Sitting balance-Leahy Scale: Fair     Standing balance  support: Bilateral upper extremity supported;No upper extremity supported;During functional activity Standing balance-Leahy Scale: Fair Standing balance comment: reliant on BUE support dynamically, but able to stand statically during grooming tasks without UE support                           ADL either performed or assessed with clinical judgement   ADL Overall ADL's : Needs assistance/impaired     Grooming: Min guard;Standing;Wash/dry hands;Oral care Grooming Details (indicate cue type and reason): min guard for tasks, increased time and effort for manipulation with R hand              Lower Body Dressing: Min guard;Sit to/from stand Lower Body Dressing Details (indicate cue type and reason): able to don/doff B socks, min guard sit to stand  Toilet Transfer: Min guard;Ambulation;RW Toilet Transfer Details (indicate cue type and reason): cueing for safety and hand placement  Toileting- Clothing Manipulation and Hygiene: Min guard;Sit to/from stand   Tub/ Shower Transfer: Tub transfer;Minimal assistance;Ambulation;3 in 1;Rolling walker Tub/Shower Transfer Details (indicate cue type and reason): min assist for balance stepping over threshold  Functional mobility during ADLs: Minimal assistance;Min guard;Rolling walker;Cueing for safety General ADL Comments: pt limited by R sided weakness, impulsivity and impaired balance.      Vision       Perception     Praxis      Cognition Arousal/Alertness: Awake/alert Behavior During Therapy: Impulsive Overall Cognitive Status: Impaired/Different from baseline Area of Impairment: Safety/judgement;Problem solving;Memory  Memory: Decreased short-term memory   Safety/Judgement: Decreased awareness of safety;Decreased awareness of deficits   Problem Solving: Requires verbal cues General Comments: pt requires cueing for safety and problem solving, continues to be impulsive and requires constant  cueing for pacing         Exercises     Shoulder Instructions       General Comments daughter present, reports she will have 24/7 support at dc     Pertinent Vitals/ Pain       Pain Assessment: No/denies pain  Home Living                                          Prior Functioning/Environment              Frequency  Min 2X/week        Progress Toward Goals  OT Goals(current goals can now be found in the care plan section)  Progress towards OT goals: Progressing toward goals  Acute Rehab OT Goals Patient Stated Goal: be ind OT Goal Formulation: With patient  Plan Frequency remains appropriate;Discharge plan needs to be updated    Co-evaluation                 AM-PAC OT "6 Clicks" Daily Activity     Outcome Measure   Help from another person eating meals?: A Little Help from another person taking care of personal grooming?: A Little Help from another person toileting, which includes using toliet, bedpan, or urinal?: A Little Help from another person bathing (including washing, rinsing, drying)?: A Little Help from another person to put on and taking off regular upper body clothing?: A Little Help from another person to put on and taking off regular lower body clothing?: A Little 6 Click Score: 18    End of Session Equipment Utilized During Treatment: Gait belt;Rolling walker  OT Visit Diagnosis: Unsteadiness on feet (R26.81);Other abnormalities of gait and mobility (R26.89);Muscle weakness (generalized) (M62.81);Other symptoms and signs involving the nervous system (R29.898);Hemiplegia and hemiparesis Hemiplegia - Right/Left: Right Hemiplegia - dominant/non-dominant: Dominant Hemiplegia - caused by: Cerebral infarction   Activity Tolerance Patient tolerated treatment well   Patient Left with call bell/phone within reach;in bed;with bed alarm set;with family/visitor present   Nurse Communication Mobility status        Time:  3875-6433 OT Time Calculation (min): 26 min  Charges: OT General Charges $OT Visit: 1 Visit OT Treatments $Self Care/Home Management : 23-37 mins  Delight Stare, OT Acute Rehabilitation Services Pager [phone] Office [phone]    Delight Stare 01/18/2019, 1:46 PM

## 2019-01-18 NOTE — Progress Notes (Signed)
Pt. Discharged home, transport provided by her daughter. Belongings returned to patient. Pt. Educated on discharge packet, questions answered.  Katrina Schmidt A Katrina Schmidt

## 2019-01-18 NOTE — Progress Notes (Signed)
Physical Therapy Treatment Patient Details Name: Katrina Schmidt MRN: [id number] DOB: [birth year] Today's Date: 01/18/2019    History of Present Illness Pt is a 58 y/o female admitted secondary to R sided weakness. 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 Pt positive for cocaine. PMH including but not limited to anxiety and HTN.    PT Comments    Pt much better today compared to yesterday. Pt with no knee buckling and was able to amb 120' with RW. Pt however remains unsteady and requires close min guard as pt with near cross over gait pattern and poor walker management causing pt to remain at increased falls risk. Daughter came at end of session and verified she would be providing 24/7 assist when her father wouldn't be there. Pt with RW at home. Pt to benefit from HHPT to improve balance to minimize falls risk. Acute PT to cont to follow.   Follow Up Recommendations  Home health PT;Supervision/Assistance - 24 hour     Equipment Recommendations  None recommended by PT(has DME)    Recommendations for Other Services       Precautions / Restrictions Precautions Precautions: Fall Restrictions Weight Bearing Restrictions: No    Mobility  Bed Mobility Overal bed mobility: Needs Assistance Bed Mobility: Supine to Sit     Supine to sit: Supervision     General bed mobility comments: HOB elevated, pt with increased time due to body habitus, used bed rail, no physical assist  Transfers Overall transfer level: Needs assistance Equipment used: Rolling walker (2 wheeled) Transfers: Sit to/from Stand Sit to Stand: Min guard         General transfer comment: min guard for safety and balance, cueing for hand placement, technique   Ambulation/Gait Ambulation/Gait assistance: Min guard Gait Distance (Feet): 120 Feet Assistive device: Rolling walker (2 wheeled) Gait Pattern/deviations: Step-through pattern;Decreased stride length;Narrow base  of support Gait velocity: dec Gait velocity interpretation: <1.31 ft/sec, indicative of household ambulator General Gait Details: pt unsteady with near crossover gait patter but no R knee buckling today, verbal and tactile cues for walker management and to navigate hallway, verbal cues to pay attention to wear she is going   Marine scientist Rankin (Stroke Patients Only) Modified Rankin (Stroke Patients Only) Pre-Morbid Rankin Score: Moderate disability Modified Rankin: Moderate disability     Balance Overall balance assessment: Needs assistance Sitting-balance support: Feet supported Sitting balance-Leahy Scale: Fair Sitting balance - Comments: supervision for safety with static sitting balance   Standing balance support: Bilateral upper extremity supported;No upper extremity supported;During functional activity Standing balance-Leahy Scale: Fair Standing balance comment: reliant on BUE support dynamically, but able to stand statically during grooming tasks without UE support                            Cognition Arousal/Alertness: Awake/alert Behavior During Therapy: Impulsive Overall Cognitive Status: Impaired/Different from baseline Area of Impairment: Safety/judgement;Problem solving;Memory                     Memory: Decreased short-term memory   Safety/Judgement: Decreased awareness of safety;Decreased awareness of deficits   Problem Solving: Requires verbal cues General Comments: pt very impulsive and focused on going home only not safety. Pt requiring verbal and tactile cues for walker management and navigation  Exercises      General Comments General comments (skin integrity, edema, etc.): daughter came in at end of session and verified she would be there with her 24/7 or when father can't be there      Pertinent Vitals/Pain Pain Assessment: No/denies pain    Home Living                       Prior Function            PT Goals (current goals can now be found in the care plan section) Acute Rehab PT Goals Patient Stated Goal: go home PT Goal Formulation: With patient Time For Goal Achievement: 01/31/19 Potential to Achieve Goals: Good Progress towards PT goals: Progressing toward goals    Frequency    Min 4X/week      PT Plan Discharge plan needs to be updated    Co-evaluation              AM-PAC PT "6 Clicks" Mobility   Outcome Measure  Help needed turning from your back to your side while in a flat bed without using bedrails?: None Help needed moving from lying on your back to sitting on the side of a flat bed without using bedrails?: None Help needed moving to and from a bed to a chair (including a wheelchair)?: A Little Help needed standing up from a chair using your arms (e.g., wheelchair or bedside chair)?: A Little Help needed to walk in hospital room?: A Little Help needed climbing 3-5 steps with a railing? : A Little 6 Click Score: 20    End of Session Equipment Utilized During Treatment: Gait belt Activity Tolerance: Patient tolerated treatment well Patient left: with call bell/phone within reach;in chair;with chair alarm set;with family/visitor present(OT present) Nurse Communication: Mobility status PT Visit Diagnosis: Other abnormalities of gait and mobility (R26.89);Other symptoms and signs involving the nervous system (R29.898)     Time: 2446-2863 PT Time Calculation (min) (ACUTE ONLY): 17 min  Charges:  $Gait Training: 8-22 mins                     Lewis Shock, PT, DPT Acute Rehabilitation Services Pager #: [phone] Office #: [phone]    Iona Hansen 01/18/2019, 2:08 PM

## 2019-01-18 NOTE — Discharge Summary (Signed)
Physician Discharge Summary  [name] MVH:[id number] DOB: 1961-02-11 DOA: 01/16/2019  PCP: System, Provider Not In  Admit date: 01/16/2019 Discharge date: 01/18/2019  Admitted From: Home Disposition: Home  Recommendations for Outpatient Follow-up:  1. Follow up with PCP in 1 week 2. Neurology follow-up 3. Please obtain BMP/CBC in one week 4. Please follow up on the following pending results: None  Home Health: PT, OT Equipment/Devices: 3 in 1  Discharge Condition: Stable CODE STATUS: Full code Diet recommendation: Heart healthy   Brief/Interim Summary:  Admission HPI written by Ollen Bowl, MD   Chief Complaint: Syncope and right-sided weakness started this morning.  HPI: Katrina Schmidt is a 58 y.o. female with medical history significant of hypertension, hyperlipidemia, degenerative disc disease, sciatica, panic attacks, tobacco abuse, alcohol abuse, presents today for fall, balance issues and weakness of right side.  Patient reports that since 2 days she has been feeling off balance constantly with intermittent increased weakness of the right side.  Reports stumbling around and she was never had this problem before.  Reports she went to bed around 3 AM this morning after drinking 40 ounce beers x2 and then she woke up around 7 AM and attempted to get out of the bed when she noticed that her both right arm and right leg felt weaker and heavy and then she fell. She denies head trauma, seizures, loss of consciousness, urinary/bowel incontinence, tongue bite, headache, blurry vision, lightheadedness, dizziness, chest pain, shortness of breath, palpitation, nausea, vomiting, fever, chills, decreased appetite or weight loss.  She lives with her daughter and smokes 10 cigarettes/day, drinks alcohol 60 ounces almost every day, denies illicit drug use.    ED Course: CT head and cervical spine came back negative.  MRI brain came back positive for small acute  infarct posterior limb internal capsule on the left.  Mild chronic microvascular ischemic changes in the white matter and pons.  EDP consulted neurology, patient is out  TPA window.  Her initial labs such as CBC, CMP, PT/INR came back within normal limits.  Ethanol: Within normal limit.    Hospital course:  Acute stroke Posterior limb of left internal capsule. CT head normal MRI significant for acute stroke as mentioned with associated mild chronic microvascular ischemic change in white matter/pons. LDL of 52, hemoglobin A1C of 5.5%. Cocaine positive. Transthoracic Echocardiogram significant for no embolic source. Neurology recommending aspirin 81 mg daily and clopidogrel 75 mg daily; DAPT x3 weeks followed by aspirin alone  Essential hypertension Patient is on hydrochlorothiazide, amlodipine and lisinopril as an outpatient. Currently held secondary to permissive hypertension.  Hyperlipidemia Patient started on Lipitor 40 mg daily  Cocaine abuse Patient states she relapsed after 29 years after the death of three family members.  Tobacco abuse Current half pack per day smoker. Cessation discussed. Patient is encouraged to quit.  Discharge Diagnoses:  Principal Problem:   CVA (cerebral vascular accident) [facility]) Active Problems:   Hypertension   Hyperlipidemia   Tobacco abuse   Alcohol abuse   Sciatica   Panic attacks    Discharge Instructions  Discharge Instructions    Ambulatory referral to Neurology   Complete by: As directed    Follow up with stroke clinic NP (Jessica Vanschaick or Darrol Angel, if both not available, consider Manson Allan, or Ahern) at [facility] in about 4 weeks. Thanks.     Allergies as of 01/18/2019      Reactions   Apple Itching   Reaction to red  apple   Citrus Itching   Peach [prunus Persica] Itching   Reaction to peaches with skin on   Pineapple Itching   Plum Pulp Itching      Medication List    STOP taking these medications     azithromycin 500 MG tablet Commonly known as: ZITHROMAX   simvastatin 20 MG tablet Commonly known as: ZOCOR     TAKE these medications   acetaminophen 500 MG tablet Commonly known as: TYLENOL Take 1,000-2,000 mg by mouth 2 (two) times daily as needed (pain).   albuterol 108 (90 Base) MCG/ACT inhaler Commonly known as: VENTOLIN HFA Inhale 2 puffs into the lungs every 6 (six) hours as needed for wheezing or shortness of breath.   ALPRAZolam 0.5 MG tablet Commonly known as: XANAX Take 1 mg by mouth 2 (two) times daily as needed (panic attacks).   amLODipine 10 MG tablet Commonly known as: NORVASC Take 1 tablet (10 mg total) by mouth every morning. Start taking on: January 20, 2019 What changed: These instructions start on January 20, 2019. If you are unsure what to do until then, ask your doctor or other care provider.   ARTIFICIAL TEARS OP Place 1 drop into both eyes 2 (two) times daily.   aspirin 81 MG EC tablet Take 1 tablet (81 mg total) by mouth daily. Start taking on: January 19, 2019   atorvastatin 40 MG tablet Commonly known as: LIPITOR Take 1 tablet (40 mg total) by mouth daily at 6 PM.   CALCIUM PO Take 1 tablet by mouth daily.   clopidogrel 75 MG tablet Commonly known as: PLAVIX Take 1 tablet (75 mg total) by mouth daily for 20 days. Start taking on: January 19, 2019   CRANBERRY PO Take 1 tablet by mouth daily.   diphenhydrAMINE 25 MG tablet Commonly known as: BENADRYL Take 50 mg by mouth 2 (two) times daily as needed for allergies.   hydrochlorothiazide 25 MG tablet Commonly known as: HYDRODIURIL Take 1 tablet (25 mg total) by mouth every morning. Start taking on: January 20, 2019 What changed: These instructions start on January 20, 2019. If you are unsure what to do until then, ask your doctor or other care provider.   HYDROcodone-acetaminophen 5-325 MG tablet Commonly known as: Norco Take 1 tablet by mouth every 6 (six) hours as needed for severe  pain.   lidocaine 5 % Commonly known as: Lidoderm Place 1 patch onto the skin daily. Remove & Discard patch within 12 hours or as directed by MD   lisinopril 40 MG tablet Commonly known as: ZESTRIL Take 1 tablet (40 mg total) by mouth every morning. Start taking on: January 20, 2019 What changed: These instructions start on January 20, 2019. If you are unsure what to do until then, ask your doctor or other care provider.   multivitamin with minerals Tabs tablet Take 1 tablet by mouth daily.   triamcinolone cream 0.1 % Commonly known as: KENALOG Apply 1 application topically 2 (two) times daily.   VITAMIN B-12 PO Take 1 tablet by mouth daily.   VITAMIN D3 PO Take 1 tablet by mouth daily.   VITAMIN E PO Take 1 capsule by mouth daily.            Durable Medical Equipment  (From admission, onward)         Start     Ordered   01/18/19 1406  For home use only DME 3 n 1  Once     01/18/19 1406  Follow-up Information    [facility]. Schedule an appointment as soon as possible for a visit in 4 week(s).   Specialty: Neurology Contact information: [address] [place] [address] [phone]         Allergies  Allergen Reactions   Apple Itching    Reaction to red apple   Citrus Itching   Peach [Prunus Persica] Itching    Reaction to peaches with skin on   Pineapple Itching   Plum Pulp Itching    Consultations:  Neurology   Procedures/Studies: Ct Angio Head W Or Wo Contrast  Result Date: 01/16/2019 CLINICAL DATA:  Stroke, follow-up. EXAM: CT ANGIOGRAPHY HEAD AND NECK TECHNIQUE: Multidetector CT imaging of the head and neck was performed using the standard protocol during bolus administration of intravenous contrast. Multiplanar CT image reconstructions and MIPs were obtained to evaluate the vascular anatomy. Carotid stenosis measurements (when applicable) are obtained utilizing NASCET criteria,  using the distal internal carotid diameter as the denominator. CONTRAST:  75 mL OMNIPAQUE IOHEXOL 350 MG/ML SOLN COMPARISON:  Brain MRI 01/16/2019, head CT 01/16/2019 FINDINGS: CTA NECK FINDINGS Aortic arch: Common origin of the innominate and left common carotid arteries. Included portions of the aortic arch demonstrate no evidence of dissection or aneurysm. Right carotid system: CCA and ICA patent within the neck without significant stenosis (50% or greater). No significant atherosclerosis. Medialized course of the common carotid artery with partially retropharyngeal course of the ICA. Left carotid system: CCA and ICA patent within the neck without significant stenosis (50% or greater). No significant atherosclerosis. Medialized course of the common carotid artery. Vertebral arteries: Left vertebral artery dominant. The vertebral arteries are patent within the neck bilaterally without significant stenosis (50% or greater). No significant atherosclerotic disease. Skeleton: No acute bony abnormality. Cervical spondylosis. C5-C6 moderate disc height loss with small posterior disc osteophyte complex. Other neck: No soft tissue neck mass Upper chest: No focal consolidation within the imaged lung apices. Paraseptal emphysema. Other: Medialized course of the caudal left internal jugular vein. Review of the MIP images confirms the above findings CTA HEAD FINDINGS Anterior circulation: Mild atherosclerotic plaque within the bilateral intracranial internal carotid arteries without significant stenosis. The right middle cerebral artery is patent without significant proximal stenosis. The A1 right anterior cerebral artery appears severely hypoplastic or aplastic. The bilateral anterior cerebral arteries are patent and predominantly supplied by the A1 left ACA. The A1 left ACA is patent without significant stenosis. The left middle cerebral artery is patent without significant proximal stenosis. No intracranial aneurysm is  identified. Posterior circulation: The intracranial vertebral arteries are patent without significant stenosis. Minimal atherosclerotic calcification within the intracranial left vertebral artery. The basilar artery is patent without significant stenosis. The bilateral posterior cerebral arteries are patent without significant proximal stenosis. Venous sinuses: Within limitations of contrast timing, no convincing thrombus. Anatomic variants: As described Other: Extensive partial opacification of the right nasal passage which may reflect secretions or polypoid soft tissue. Review of the MIP images confirms the above findings IMPRESSION: CTA head: 1. No intracranial large vessel occlusion or proximal high-grade arterial stenosis. 2. Mild intracranial atherosclerotic disease as described. 3. Extensive partial opacification of the right nasal passage which may reflect secretions or polypoid soft tissue. Correlate clinically and consider direct visualization. CTA neck: 1. Common carotid, internal carotid and vertebral arteries patent within the neck bilaterally without significant stenosis (50% or greater). Mild atherosclerosis within the visualized aortic arch. 2. Medialized course of the common carotid arteries bilaterally.  Partially retropharyngeal course of the right ICA. 3. Medialized course of the caudal left internal jugular vein. Electronically Signed   By: Kellie Simmering   On: 01/16/2019 21:30   Ct Head Wo Contrast  Result Date: 01/16/2019 CLINICAL DATA:  Pain following fall.  Lower extremity weakness EXAM: CT HEAD WITHOUT CONTRAST CT CERVICAL SPINE WITHOUT CONTRAST TECHNIQUE: Multidetector CT imaging of the head and cervical spine was performed following the standard protocol without intravenous contrast. Multiplanar CT image reconstructions of the cervical spine were also generated. COMPARISON:  None. FINDINGS: CT HEAD FINDINGS Brain: Ventricles are normal in size and configuration. There is no intracranial  mass, hemorrhage, extra-axial fluid collection, or midline shift. The brain parenchyma appears unremarkable. No evident acute infarct. Vascular: There is no hyperdense vessel. No appreciable vascular calcification. Skull: Bony calvarium appears intact. Sinuses/Orbits: Visualized paranasal sinuses are clear. Visualized orbits appear symmetric bilaterally. Other: Mastoid air cells are clear. CT CERVICAL SPINE FINDINGS Alignment: There is no demonstrable spondylolisthesis. Skull base and vertebrae: Skull base and craniocervical junction regions appear normal. No fracture evident. There are no blastic or lytic bone lesions. Soft tissues and spinal canal: Prevertebral soft tissues and predental space regions are normal. No cord or canal hematoma. No paraspinous lesions. Disc levels: There is moderate disc space narrowing at C5-6 and to a slightly lesser extent at C4-5. Other disc spaces appear unremarkable. There is facet hypertrophy at C4-5 and C5-6. No disc extrusion or stenosis. No appreciable nerve root edema or effacement. Upper chest: There is scarring in the apices, slightly more on the right than on the left. Visualized upper lung regions are clear. Other: None IMPRESSION: CT head: Study within normal limits. 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 No fracture or spondylolisthesis. Apical lung scarring noted. Electronically Signed   By: Lowella Grip III M.D.   On: 01/16/2019 10:15   Ct Angio Neck W Or Wo Contrast  Result Date: 01/16/2019 CLINICAL DATA:  Stroke, follow-up. EXAM: CT ANGIOGRAPHY HEAD AND NECK TECHNIQUE: Multidetector CT imaging of the head and neck was performed using the standard protocol during bolus administration of intravenous contrast. Multiplanar CT image reconstructions and MIPs were obtained to evaluate the vascular anatomy. Carotid stenosis measurements (when applicable) are obtained utilizing NASCET criteria, using the distal internal  carotid diameter as the denominator. CONTRAST:  75 mL OMNIPAQUE IOHEXOL 350 MG/ML SOLN COMPARISON:  Brain MRI 01/16/2019, head CT 01/16/2019 FINDINGS: CTA NECK FINDINGS Aortic arch: Common origin of the innominate and left common carotid arteries. Included portions of the aortic arch demonstrate no evidence of dissection or aneurysm. Right carotid system: CCA and ICA patent within the neck without significant stenosis (50% or greater). No significant atherosclerosis. Medialized course of the common carotid artery with partially retropharyngeal course of the ICA. Left carotid system: CCA and ICA patent within the neck without significant stenosis (50% or greater). No significant atherosclerosis. Medialized course of the common carotid artery. Vertebral arteries: Left vertebral artery dominant. The vertebral arteries are patent within the neck bilaterally without significant stenosis (50% or greater). No significant atherosclerotic disease. Skeleton: No acute bony abnormality. Cervical spondylosis. C5-C6 moderate disc height loss with small posterior disc osteophyte complex. Other neck: No soft tissue neck mass Upper chest: No focal consolidation within the imaged lung apices. Paraseptal emphysema. Other: Medialized course of the caudal left internal jugular vein. Review of the MIP images confirms the above findings CTA HEAD FINDINGS Anterior circulation: Mild atherosclerotic plaque within the bilateral intracranial  internal carotid arteries without significant stenosis. The right middle cerebral artery is patent without significant proximal stenosis. The A1 right anterior cerebral artery appears severely hypoplastic or aplastic. The bilateral anterior cerebral arteries are patent and predominantly supplied by the A1 left ACA. The A1 left ACA is patent without significant stenosis. The left middle cerebral artery is patent without significant proximal stenosis. No intracranial aneurysm is identified. Posterior  circulation: The intracranial vertebral arteries are patent without significant stenosis. Minimal atherosclerotic calcification within the intracranial left vertebral artery. The basilar artery is patent without significant stenosis. The bilateral posterior cerebral arteries are patent without significant proximal stenosis. Venous sinuses: Within limitations of contrast timing, no convincing thrombus. Anatomic variants: As described Other: Extensive partial opacification of the right nasal passage which may reflect secretions or polypoid soft tissue. Review of the MIP images confirms the above findings IMPRESSION: CTA head: 1. No intracranial large vessel occlusion or proximal high-grade arterial stenosis. 2. Mild intracranial atherosclerotic disease as described. 3. Extensive partial opacification of the right nasal passage which may reflect secretions or polypoid soft tissue. Correlate clinically and consider direct visualization. CTA neck: 1. Common carotid, internal carotid and vertebral arteries patent within the neck bilaterally without significant stenosis (50% or greater). Mild atherosclerosis within the visualized aortic arch. 2. Medialized course of the common carotid arteries bilaterally. Partially retropharyngeal course of the right ICA. 3. Medialized course of the caudal left internal jugular vein. Electronically Signed   By: Jackey Loge   On: 01/16/2019 21:30   Ct Cervical Spine Wo Contrast  Result Date: 01/16/2019 CLINICAL DATA:  Pain following fall.  Lower extremity weakness EXAM: CT HEAD WITHOUT CONTRAST CT CERVICAL SPINE WITHOUT CONTRAST TECHNIQUE: Multidetector CT imaging of the head and cervical spine was performed following the standard protocol without intravenous contrast. Multiplanar CT image reconstructions of the cervical spine were also generated. COMPARISON:  None. FINDINGS: CT HEAD FINDINGS Brain: Ventricles are normal in size and configuration. There is no intracranial mass,  hemorrhage, extra-axial fluid collection, or midline shift. The brain parenchyma appears unremarkable. No evident acute infarct. Vascular: There is no hyperdense vessel. No appreciable vascular calcification. Skull: Bony calvarium appears intact. Sinuses/Orbits: Visualized paranasal sinuses are clear. Visualized orbits appear symmetric bilaterally. Other: Mastoid air cells are clear. CT CERVICAL SPINE FINDINGS Alignment: There is no demonstrable spondylolisthesis. Skull base and vertebrae: Skull base and craniocervical junction regions appear normal. No fracture evident. There are no blastic or lytic bone lesions. Soft tissues and spinal canal: Prevertebral soft tissues and predental space regions are normal. No cord or canal hematoma. No paraspinous lesions. Disc levels: There is moderate disc space narrowing at C5-6 and to a slightly lesser extent at C4-5. Other disc spaces appear unremarkable. There is facet hypertrophy at C4-5 and C5-6. No disc extrusion or stenosis. No appreciable nerve root edema or effacement. Upper chest: There is scarring in the apices, slightly more on the right than on the left. Visualized upper lung regions are clear. Other: None IMPRESSION: CT head: Study within normal limits. 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 No fracture or spondylolisthesis. Apical lung scarring noted. Electronically Signed   By: Bretta Bang III M.D.   On: 01/16/2019 10:15   Mr Brain Wo Contrast  Result Date: 01/16/2019 CLINICAL DATA:  Focal neuro deficit greater than 6 hours EXAM: MRI HEAD WITHOUT CONTRAST TECHNIQUE: Multiplanar, multiecho pulse sequences of the brain and surrounding structures were obtained without intravenous contrast. COMPARISON:  CT head 01/16/2019  FINDINGS: Brain: Small area of acute infarct in the posterior limb internal capsule on the left. No other acute infarct. Patchy white matter disease bilaterally. Mild patchy hyperintensity in the  pons. Negative for hemorrhage or mass. Ventricle size and cerebral volume normal. Vascular: Normal arterial flow voids Skull and upper cervical spine: Negative Sinuses/Orbits: Mild mucosal edema paranasal sinuses.  Normal orbit Other: None IMPRESSION: Small acute infarct posterior limb internal capsule on the left Mild chronic microvascular ischemic change in the white matter and pons. Electronically Signed   By: Marlan Palauharles  Clark M.D.   On: 01/16/2019 15:07     10/3: Transthoracic Echocardiogram IMPRESSIONS    1. Left ventricular ejection fraction, by visual estimation, is 55 to 60%. The left ventricle has normal function. Normal left ventricular size. There is mildly increased left ventricular hypertrophy.  2. Left ventricular diastolic Doppler parameters are consistent with impaired relaxation pattern of LV diastolic filling.  3. Global right ventricle has normal systolic function.The right ventricular size is normal. No increase in right ventricular wall thickness.  4. Left atrial size was upper normal.  5. Right atrial size was normal.  6. Mild aortic valve annular calcification.  7. Mild mitral annular calcification.  8. The mitral valve is grossly normal. Trace mitral valve regurgitation.  9. The tricuspid valve is grossly normal. Tricuspid valve regurgitation is trivial. 10. The aortic valve is tricuspid Aortic valve regurgitation was not visualized by color flow Doppler. 11. The pulmonic valve was not well visualized. Pulmonic valve regurgitation is not visualized by color flow Doppler. 12. TR signal is inadequate for assessing pulmonary artery systolic pressure. 13. The interatrial septum was not well visualized.  In comparison to the previous echocardiogram(s): Unable to compare with prior study images. FINDINGS  Left Ventricle: Left ventricular ejection fraction, by visual estimation, is 55 to 60%. The left ventricle has normal function. There is mildly increased left ventricular  hypertrophy. Normal left ventricular size. Spectral Doppler shows Left ventricular  diastolic Doppler parameters are consistent with impaired relaxation pattern of LV diastolic filling.    LV Wall Scoring: All segments are normal.  Right Ventricle: The right ventricular size is normal. No increase in right ventricular wall thickness. Global RV systolic function is has normal systolic function.  Left Atrium: Left atrial size was upper normal.  Right Atrium: Right atrial size was normal in size  Pericardium: There is no evidence of pericardial effusion.  Mitral Valve: The mitral valve is grossly normal. Mild mitral annular calcification. Trace mitral valve regurgitation.  Tricuspid Valve: The tricuspid valve is grossly normal. Tricuspid valve regurgitation is trivial by color flow Doppler.  Aortic Valve: The aortic valve is tricuspid. Aortic valve regurgitation was not visualized by color flow Doppler. Mild aortic valve annular calcification.  Pulmonic Valve: The pulmonic valve was not well visualized. Pulmonic valve regurgitation is not visualized by color flow Doppler.  Aorta: The aortic root is normal in size and structure.  Venous: The inferior vena cava was not well visualized.  IAS/Shunts: The interatrial septum was not well visualized.   Subjective: No issues  Discharge Exam: Vitals:   01/18/19 0755 01/18/19 1141  BP: (!) 160/84 (!) 178/104  Pulse: 69 83  Resp: 20 20  Temp: 98 F (36.7 C) 98.2 F (36.8 C)  SpO2: 100% 95%   Vitals:   01/17/19 2354 01/18/19 0419 01/18/19 0755 01/18/19 1141  BP: (!) 168/93 (!) 155/90 (!) 160/84 (!) 178/104  Pulse: 84 77 69 83  Resp: 17 16 20  20  Temp: 98.2 F (36.8 C) 98 F (36.7 C) 98 F (36.7 C) 98.2 F (36.8 C)  TempSrc: Oral  Oral Oral  SpO2: 95% 96% 100% 95%  Weight:      Height:        General: Pt is alert, awake, not in acute distress Cardiovascular: RRR, S1/S2 +, no rubs, no gallops Respiratory: CTA  bilaterally, no wheezing, no rhonchi Abdominal: Soft, NT, ND, bowel sounds + Extremities: no edema, no cyanosis    The results of significant diagnostics from this hospitalization (including imaging, microbiology, ancillary and laboratory) are listed below for reference.     Microbiology: Recent Results (from the past 240 hour(s))  SARS CORONAVIRUS 2 (TAT 6-24 HRS) Nasopharyngeal Nasopharyngeal Swab     Status: None   Collection Time: 01/16/19  3:16 PM   Specimen: Nasopharyngeal Swab  Result Value Ref Range Status   SARS Coronavirus 2 NEGATIVE NEGATIVE Final    Comment: (NOTE) SARS-CoV-2 target nucleic acids are NOT DETECTED. The SARS-CoV-2 RNA is generally detectable in upper and lower respiratory specimens during the acute phase of infection. Negative results do not preclude SARS-CoV-2 infection, do not rule out co-infections with other pathogens, and should not be used as the sole basis for treatment or other patient management decisions. Negative results must be combined with clinical observations, patient history, and epidemiological information. The expected result is Negative. Fact Sheet for Patients: HairSlick.no Fact Sheet for Healthcare Providers: quierodirigir.com This test is not yet approved or cleared by the [place] FDA and  has been authorized for detection and/or diagnosis of SARS-CoV-2 by FDA under an Emergency Use Authorization (EUA). This EUA will remain  in effect (meaning this test can be used) for the duration of the COVID-19 declaration under Section 56 4(b)(1) of the Act, 21 U.S.C. section 360bbb-3(b)(1), unless the authorization is terminated or revoked sooner. Performed at [facility], [address], [place], [place] [address]      Labs: BNP (last 3 results) No results for input(s): BNP in the last 8760 hours. Basic Metabolic Panel: Recent Labs  Lab 01/16/19 0923  01/16/19 0952 01/16/19 1713  NA 140 141  --   K 4.0 3.8  --   CL 105 107  --   CO2 24  --   --   GLUCOSE 101* 98  --   BUN 8 9  --   CREATININE 0.68 0.50 0.83  CALCIUM 9.0  --   --    Liver Function Tests: Recent Labs  Lab 01/16/19 0923  AST 22  ALT 25  ALKPHOS 64  BILITOT 0.7  PROT 6.5  ALBUMIN 3.8   No results for input(s): LIPASE, AMYLASE in the last 168 hours. No results for input(s): AMMONIA in the last 168 hours. CBC: Recent Labs  Lab 01/16/19 0923 01/16/19 0952 01/16/19 1713  WBC 7.4  --  6.5  NEUTROABS 4.6  --   --   HGB 15.6* 15.3* 15.1*  HCT 46.0 45.0 45.3  MCV 95.8  --  97.0  PLT 339  --  348   Cardiac Enzymes: No results for input(s): CKTOTAL, CKMB, CKMBINDEX, TROPONINI in the last 168 hours. BNP: Invalid input(s): POCBNP CBG: Recent Labs  Lab 01/16/19 0847  GLUCAP 105*   D-Dimer No results for input(s): DDIMER in the last 72 hours. Hgb A1c Recent Labs    01/17/19 0635  HGBA1C 5.5   Lipid Profile Recent Labs    01/17/19 0635  CHOL 142  HDL 35*  LDLCALC 52  TRIG 275*  CHOLHDL 4.1   Thyroid function studies No results for input(s): TSH, T4TOTAL, T3FREE, THYROIDAB in the last 72 hours.  Invalid input(s): FREET3 Anemia work up No results for input(s): VITAMINB12, FOLATE, FERRITIN, TIBC, IRON, RETICCTPCT in the last 72 hours. Urinalysis    Component Value Date/Time   COLORURINE YELLOW 01/17/2019 0409   APPEARANCEUR CLEAR 01/17/2019 0409   LABSPEC >1.046 (H) 01/17/2019 0409   PHURINE 5.0 01/17/2019 0409   GLUCOSEU NEGATIVE 01/17/2019 0409   HGBUR SMALL (A) 01/17/2019 0409   BILIRUBINUR NEGATIVE 01/17/2019 0409   KETONESUR NEGATIVE 01/17/2019 0409   PROTEINUR NEGATIVE 01/17/2019 0409   NITRITE NEGATIVE 01/17/2019 0409   LEUKOCYTESUR NEGATIVE 01/17/2019 0409   Sepsis Labs Invalid input(s): PROCALCITONIN,  WBC,  LACTICIDVEN Microbiology Recent Results (from the past 240 hour(s))  SARS CORONAVIRUS 2 (TAT 6-24 HRS)  Nasopharyngeal Nasopharyngeal Swab     Status: None   Collection Time: 01/16/19  3:16 PM   Specimen: Nasopharyngeal Swab  Result Value Ref Range Status   SARS Coronavirus 2 NEGATIVE NEGATIVE Final    Comment: (NOTE) SARS-CoV-2 target nucleic acids are NOT DETECTED. The SARS-CoV-2 RNA is generally detectable in upper and lower respiratory specimens during the acute phase of infection. Negative results do not preclude SARS-CoV-2 infection, do not rule out co-infections with other pathogens, and should not be used as the sole basis for treatment or other patient management decisions. Negative results must be combined with clinical observations, patient history, and epidemiological information. The expected result is Negative. Fact Sheet for Patients: HairSlick.no Fact Sheet for Healthcare Providers: quierodirigir.com This test is not yet approved or cleared by the [place] FDA and  has been authorized for detection and/or diagnosis of SARS-CoV-2 by FDA under an Emergency Use Authorization (EUA). This EUA will remain  in effect (meaning this test can be used) for the duration of the COVID-19 declaration under Section 56 4(b)(1) of the Act, 21 U.S.C. section 360bbb-3(b)(1), unless the authorization is terminated or revoked sooner. Performed at [facility], [address] [place], [place] [address]      Time coordinating discharge: 35 minutes  SIGNED:   Jacquelin Hawking, MD [facility] 01/18/2019, 2:34 PM

## 2019-01-18 NOTE — Discharge Instructions (Signed)
Katrina Schmidt,  You were in the hospital with a stroke. You need to stop using cocaine and stop smoking. Please follow-up with the neurologist.

## 2019-01-28 ENCOUNTER — Telehealth: Payer: Self-pay | Admitting: Neurology

## 2019-01-28 NOTE — Telephone Encounter (Signed)
Called the patient to reschedule her apt. Katrina Schmidt will be out of the office tomorrow. There was no answer. LVM for the patient to call back so that we may reschedule.

## 2019-01-29 ENCOUNTER — Inpatient Hospital Stay: Payer: Self-pay | Admitting: Adult Health

## 2019-02-10 ENCOUNTER — Ambulatory Visit: Payer: Medicaid Other | Attending: Family Medicine | Admitting: Physical Therapy

## 2019-02-10 ENCOUNTER — Encounter: Payer: Self-pay | Admitting: Physical Therapy

## 2019-02-10 ENCOUNTER — Other Ambulatory Visit: Payer: Self-pay

## 2019-02-10 DIAGNOSIS — R2689 Other abnormalities of gait and mobility: Secondary | ICD-10-CM | POA: Insufficient documentation

## 2019-02-10 DIAGNOSIS — I69351 Hemiplegia and hemiparesis following cerebral infarction affecting right dominant side: Secondary | ICD-10-CM | POA: Diagnosis present

## 2019-02-10 DIAGNOSIS — M6281 Muscle weakness (generalized): Secondary | ICD-10-CM | POA: Diagnosis not present

## 2019-02-10 NOTE — Patient Instructions (Signed)
Access Code: [email or web address]  URL: [email or web address]  Date: 02/10/2019  Prepared by: Elsie Ra   Exercises  Standing Marching - 10 reps - 1-3 sets - 2x daily - 6x weekly  Standing Hip Abduction - 10 reps - 1-2 sets - 2x daily - 6x weekly  Sit to Stand without Arm Support - 10 reps - 1-2 sets - 2x daily - 6x weekly  Heel Toe Raises with Counter Support - 10 reps - 2 sets - 2x daily - 6x weekly  Standing Tandem Balance with Counter Support - 3 reps - 1 sets - 30 hold - 2x daily - 6x weekly  Side Stepping with Counter Support - 3-5 reps - 1 sets - 2x daily - 6x weekly

## 2019-02-10 NOTE — Therapy (Signed)
[facility] [address] [place], [place], [address] Phone: [phone]   Fax:  [phone]  Physical Therapy Evaluation  Patient Details  Name: Katrina Schmidt MRN: [id number] Date of Birth: April 16, 1961 Referring Provider (PT): Mariel Aloe, MD   Encounter Date: 02/10/2019  PT End of Session - 02/10/19 1645    Visit Number  1    Number of Visits  12    Date for PT Re-Evaluation  05/05/19    Authorization Type  MCD so likely only 3 visits then will have to resubmit    PT Start Time  1540    PT Stop Time  1615    PT Time Calculation (min)  35 min    Equipment Utilized During Treatment  Gait belt    Activity Tolerance  Patient tolerated treatment well    Behavior During Therapy  [facility] for tasks assessed/performed       Past Medical History:  Diagnosis Date  . Anxiety   . Chronic bronchitis ([place])   . DDD (degenerative disc disease), lumbosacral   . DJD (degenerative joint disease)   . Hyperlipidemia   . Hypertension   . Sciatica     Past Surgical History:  Procedure Laterality Date  . HERNIA REPAIR      There were no vitals filed for this visit.   Subjective Assessment - 02/10/19 1546    Subjective  Katrina Schmidt is a 58 y.o. female with medical history significant of hypertension, hyperlipidemia, degenerative disc disease, sciatica, panic attacks, tobacco abuse, alcohol abuse, presents today for fall, balance issues and weakness of right side. She had CVA on 01/16/19.    Pertinent History  CVA, medical history significant of hypertension, hyperlipidemia, degenerative disc disease, sciatica, panic attacks, tobacco abuse, alcohol abuse,    Limitations  Lifting;Standing;Walking;House hold activities    Diagnostic tests  MRI brain came back positive for small acute infarct posterior limb internal capsule on the left.    Patient Stated Goals  improve balance and Rt leg strength    Currently in Pain?  Yes    Chronic LBP 8/10   Pain Orientation  Lower    Pain Descriptors / Indicators  Aching    Pain Type  Chronic pain         [facility] PT Assessment - 02/10/19 0001      Assessment   Medical Diagnosis  CVA, Rt hemiparesis 01/16/19    Referring Provider (PT)  Mariel Aloe, MD    Onset Date/Surgical Date  01/16/19    Hand Dominance  Right    Next MD Visit  sees neurologist Friday    Prior Therapy  PT in past for chronic LBP      Precautions   Precautions  Fall      Balance Screen   Has the patient fallen in the past 6 months  Yes    How many times?  2   when she was having CVA     [place] residence    Additional Comments  --      Prior Function   Level of Independence  Needs assistance with ADLs    Comments  has home health aid that helps with cleaning and getting her in and out of shower, sometimes to put on her shoes      Cognition   Overall Cognitive Status  Within Functional Limits for tasks assessed      Sensation  Light Touch  Appears Intact      Coordination   Gross Motor Movements are Fluid and Coordinated  --   WFL RAMPS   Finger Nose Finger Test  decreased Lt vs Rt    Heel Shin Test  WFL      ROM / Strength   AROM / PROM / Strength  AROM;Strength      Strength   Overall Strength Comments  Rt UE and LE overall 4+/5 grossly except 4/5 Rt ankle, complared to 5/5 MMT on Lt all tested in sitting      Transfers   Transfers  Independent with all Transfers      Ambulation/Gait   Ambulation/Gait  Yes    Ambulation/Gait Assistance  5: Supervision    Ambulation Distance (Feet)  75 Feet    Assistive device  Rollator    Gait Pattern  Decreased step length - right;Decreased step length - left;Decreased stance time - right;Decreased hip/knee flexion - right;Decreased hip/knee flexion - left;Decreased dorsiflexion - right;Right circumduction;Right hip hike    Ambulation Surface  Level;Indoor    Gait velocity  20 ft in 14 seconds  =1.4 ft/sec      Standardized Balance Assessment   Standardized Balance Assessment  Berg Balance Test;Timed Up and Go Test;Five Times Sit to Stand    Five times sit to stand comments   25 sec no UE support from standard chair      Berg Balance Test   Sit to Stand  Able to stand without using hands and stabilize independently    Standing Unsupported  Able to stand safely 2 minutes    Sitting with Back Unsupported but Feet Supported on Floor or Stool  Able to sit safely and securely 2 minutes    Stand to Sit  Sits safely with minimal use of hands    Transfers  Able to transfer safely, minor use of hands    Standing Unsupported with Eyes Closed  Able to stand 10 seconds with supervision    Standing Unsupported with Feet Together  Able to place feet together independently and stand for 1 minute with supervision    From Standing, Reach Forward with Outstretched Arm  Can reach forward >12 cm safely (5")    From Standing Position, Pick up Object from Floor  Able to pick up shoe, needs supervision    From Standing Position, Turn to Look Behind Over each Shoulder  Looks behind one side only/other side shows less weight shift    Turn 360 Degrees  Needs close supervision or verbal cueing    Standing Unsupported, Alternately Place Feet on Step/Stool  Able to complete 4 steps without aid or supervision    Standing Unsupported, One Foot in Front  Able to take small step independently and hold 30 seconds    Standing on One Leg  Tries to lift leg/unable to hold 3 seconds but remains standing independently    Total Score  41      Timed Up and Go Test   Normal TUG (seconds)  19    TUG Comments  using rollator                Objective measurements completed on examination: See above findings.              PT Education - 02/10/19 1645    Education Details  HEP, poc, exam findings    Person(s) Educated  Patient    Methods  Explanation;Demonstration;Verbal cues;Handout  Comprehension  Verbalized understanding;Need further instruction       PT Short Term Goals - 02/10/19 1716      PT SHORT TERM GOAL #1   Title  Pt will be I and compliant with initial HEP. (Target for all STG 4 weeks 03/12/19)    Baseline  no HEP until today    Status  New      PT SHORT TERM GOAL #2   Title  Pt will improve BERG balance score by 3 points to show improved balance.    Baseline  41    Status  New      PT SHORT TERM GOAL #3   Title  Pt will improve 5TSTS test by 3 sec to show improved leg strength and balance.    Baseline  25 sec    Status  New        PT Long Term Goals - 02/10/19 1734      PT LONG TERM GOAL #1   Title  Pt will improve BERG by 6 points to show improved balance. (Target for all LTG 12 weeks 05/05/2019)    Baseline  41    Status  New      PT LONG TERM GOAL #2   Title  Pt will improve 5TSTS test by 10 sec to show improved balance and strength.    Baseline  25 sec    Status  New      PT LONG TERM GOAL #3   Title  Pt will improve TUG by 5 seconds with LRAD to show improved gait speed and balance.    Baseline  19 sec used rollator    Status  New      PT LONG TERM GOAL #4   Title  Pt will be able to ambulate community distances at least 500 ft with LRAD with improved gait pattern and foot clearance.    Baseline  only able to walk 100 ft at most needs rolloator and supervision due to poor balance and foot clearance.    Status  New             Plan - 02/10/19 1649    Clinical Impression Statement  Pt presents with Rt hemiparesis and gait/balance deficits from CVA on 01/16/2019. She is using Rollator for limited community ambulation. Her balance tests place her at a high risk of falling. She will benefit from skilled PT to address her deficits.    Personal Factors and Comorbidities  Comorbidity 1;Comorbidity 2;Comorbidity 3+    Comorbidities  CVA, hypertension, hyperlipidemia, degenerative disc disease, sciatica, panic attacks, tobacco abuse,  alcohol abuse    Examination-Activity Limitations  Locomotion Level;Carry;Squat;Stairs;Lift;Stand    Examination-Participation Restrictions  Meal Prep;Cleaning;Community Activity;Laundry;Shop    Stability/Clinical Decision Making  Evolving/Moderate complexity    Clinical Decision Making  Moderate    Rehab Potential  Good    PT Frequency  Other (comment)   1-2   PT Duration  12 weeks    PT Treatment/Interventions  ADLs/Self Care Home Management;Aquatic Therapy;Moist Heat;Cryotherapy;Gait training;Stair training;Therapeutic activities;Therapeutic exercise;Balance training;Neuromuscular re-education;Manual techniques;Passive range of motion    PT Next Visit Plan  needs Rt sided strength and overall gait/balance. How is HEP going?    PT Home Exercise Plan  hypertension, hyperlipidemia, degenerative disc disease, sciatica, panic attacks, tobacco abuse, alcohol abuse    Consulted and Agree with Plan of Care  Patient       Patient will benefit from skilled therapeutic intervention in order to improve the following  deficits and impairments:  Abnormal gait, Cardiopulmonary status limiting activity, Decreased activity tolerance, Decreased balance, Decreased coordination, Decreased mobility, Decreased strength, Decreased range of motion, Difficulty walking, Postural dysfunction, Pain  Visit Diagnosis: Muscle weakness (generalized)  Other abnormalities of gait and mobility  Hemiplegia and hemiparesis following cerebral infarction affecting right dominant side [facility](HCC)     Problem List Patient Active Problem List   Diagnosis Date Noted  . CVA (cerebral vascular accident) (HCC) 01/16/2019  . Hypertension 01/16/2019  . Hyperlipidemia 01/16/2019  . Tobacco abuse 01/16/2019  . Alcohol abuse 01/16/2019  . Sciatica 01/16/2019  . Panic attacks 01/16/2019    Birdie RiddleBrian R Audi Conover,PT,DPT 02/10/2019, 5:42 PM  [facility] [address] [place], [place], [address] Phone: [phone]   Fax:  [phone]  Name: Katrina Schmidt MRN: [id number] Date of Birth: Jul 11, 1960

## 2019-02-16 ENCOUNTER — Other Ambulatory Visit: Payer: Self-pay

## 2019-02-16 ENCOUNTER — Ambulatory Visit: Payer: Medicaid Other | Attending: Family Medicine | Admitting: Physical Therapy

## 2019-02-16 DIAGNOSIS — R2689 Other abnormalities of gait and mobility: Secondary | ICD-10-CM | POA: Diagnosis present

## 2019-02-16 DIAGNOSIS — I69351 Hemiplegia and hemiparesis following cerebral infarction affecting right dominant side: Secondary | ICD-10-CM | POA: Insufficient documentation

## 2019-02-16 DIAGNOSIS — M6281 Muscle weakness (generalized): Secondary | ICD-10-CM | POA: Insufficient documentation

## 2019-02-16 NOTE — Therapy (Signed)
[facility] [address] [place], [place], [address] Phone: [phone]   Fax:  [phone]  Physical Therapy Treatment  Patient Details  Name: Katrina Schmidt MRN: [id number] Date of Birth: 02-13-1961 Referring Provider (PT): Narda Bonds, MD   Encounter Date: 02/16/2019  PT End of Session - 02/16/19 1658    Visit Number  2    Number of Visits  12    Date for PT Re-Evaluation  05/05/19    Authorization Type  MCD has approaved 3 visits 11/2 to 11/22    Authorization - Visit Number  1    Authorization - Number of Visits  3    PT Start Time  1630   pt 15 min late   PT Stop Time  1700    PT Time Calculation (min)  30 min    Equipment Utilized During Treatment  Gait belt    Activity Tolerance  Patient tolerated treatment well    Behavior During Therapy  [facility] for tasks assessed/performed       Past Medical History:  Diagnosis Date  . Anxiety   . Chronic bronchitis (HCC)   . DDD (degenerative disc disease), lumbosacral   . DJD (degenerative joint disease)   . Hyperlipidemia   . Hypertension   . Sciatica     Past Surgical History:  Procedure Laterality Date  . HERNIA REPAIR      There were no vitals filed for this visit.  Subjective Assessment - 02/16/19 1646    Subjective  Relays 10 out of 10 pain all over. She has been doing some of the exercises but breaks them up as needed. She thought she was here for neruology today but was informed that is not until next week    Pertinent History  CVA, medical history significant of hypertension, hyperlipidemia, degenerative disc disease, sciatica, panic attacks, tobacco abuse, alcohol abuse,    Limitations  Lifting;Standing;Walking;House hold activities    Diagnostic tests  MRI brain came back positive for small acute infarct posterior limb internal capsule on the left.    Patient Stated Goals  improve balance and Rt leg strength                        [facility] Adult PT Treatment/Exercise - 02/16/19 0001      Neuro Re-ed    Neuro Re-ed Details   balance in bars: walking no AD, tandem balance      Exercises   Exercises  Other Exercises    Other Exercises   nu step for 5 min started at L3 but dropped down to L1 due to pain, sit to stands 2X5, supine marches X 10 bilat, supine clams with red X 10 bilat, Sidelying clam for Rt hip X 10, standing hip abd X 10 bilat, heel toe raises X 10 bilat               PT Short Term Goals - 02/10/19 1716      PT SHORT TERM GOAL #1   Title  Pt will be I and compliant with initial HEP. (Target for all STG 4 weeks 03/12/19)    Baseline  no HEP until today    Status  New      PT SHORT TERM GOAL #2   Title  Pt will improve BERG balance score by 3 points to show improved balance.    Baseline  41    Status  New  PT SHORT TERM GOAL #3   Title  Pt will improve 5TSTS test by 3 sec to show improved leg strength and balance.    Baseline  25 sec    Status  New        PT Long Term Goals - 02/10/19 1734      PT LONG TERM GOAL #1   Title  Pt will improve BERG by 6 points to show improved balance. (Target for all LTG 12 weeks 05/05/2019)    Baseline  41    Status  New      PT LONG TERM GOAL #2   Title  Pt will improve 5TSTS test by 10 sec to show improved balance and strength.    Baseline  25 sec    Status  New      PT LONG TERM GOAL #3   Title  Pt will improve TUG by 5 seconds with LRAD to show improved gait speed and balance.    Baseline  19 sec used rollator    Status  New      PT LONG TERM GOAL #4   Title  Pt will be able to ambulate community distances at least 500 ft with LRAD with improved gait pattern and foot clearance.    Baseline  only able to walk 100 ft at most needs rolloator and supervision due to poor balance and foot clearance.    Status  New            Plan - 02/16/19 1700    Clinical Impression Statement  Pt in a lot of pain  today limiting her tolerance for strengthening, performed light strengthening and balance today as able. She will need gentle progression in these areas without aggravating her chronic pain.    Personal Factors and Comorbidities  Comorbidity 1;Comorbidity 2;Comorbidity 3+    Comorbidities  CVA, hypertension, hyperlipidemia, degenerative disc disease, sciatica, panic attacks, tobacco abuse, alcohol abuse    Examination-Activity Limitations  Locomotion Level;Carry;Squat;Stairs;Lift;Stand    Examination-Participation Restrictions  Meal Prep;Cleaning;Community Activity;Laundry;Shop    Stability/Clinical Decision Making  Evolving/Moderate complexity    Rehab Potential  Good    PT Frequency  Other (comment)   1-2   PT Duration  12 weeks    PT Treatment/Interventions  ADLs/Self Care Home Management;Aquatic Therapy;Moist Heat;Cryotherapy;Gait training;Stair training;Therapeutic activities;Therapeutic exercise;Balance training;Neuromuscular re-education;Manual techniques;Passive range of motion    PT Next Visit Plan  needs Rt sided strength and overall gait/balance. How is HEP going?    PT Home Exercise Plan  hypertension, hyperlipidemia, degenerative disc disease, sciatica, panic attacks, tobacco abuse, alcohol abuse    Consulted and Agree with Plan of Care  Patient       Patient will benefit from skilled therapeutic intervention in order to improve the following deficits and impairments:  Abnormal gait, Cardiopulmonary status limiting activity, Decreased activity tolerance, Decreased balance, Decreased coordination, Decreased mobility, Decreased strength, Decreased range of motion, Difficulty walking, Postural dysfunction, Pain  Visit Diagnosis: Muscle weakness (generalized)  Other abnormalities of gait and mobility  Hemiplegia and hemiparesis following cerebral infarction affecting right dominant side [facility](HCC)     Problem List Patient Active Problem List   Diagnosis Date Noted  . CVA (cerebral  vascular accident) (HCC) 01/16/2019  . Hypertension 01/16/2019  . Hyperlipidemia 01/16/2019  . Tobacco abuse 01/16/2019  . Alcohol abuse 01/16/2019  . Sciatica 01/16/2019  . Panic attacks 01/16/2019    Birdie RiddleBrian R Nysha Koplin,PT,DPT 02/16/2019, 5:01 PM  [facility] [address]  [place], [place], [address] Phone: [phone]   Fax:  [phone]  Name: Katrina Schmidt MRN: [id number] Date of Birth: September 17, 1960

## 2019-02-24 ENCOUNTER — Ambulatory Visit: Payer: Medicaid Other | Admitting: Physical Therapy

## 2019-02-26 ENCOUNTER — Inpatient Hospital Stay: Payer: Self-pay | Admitting: Adult Health

## 2019-02-26 ENCOUNTER — Telehealth: Payer: Self-pay

## 2019-02-26 ENCOUNTER — Encounter: Payer: Self-pay | Admitting: Adult Health

## 2019-02-26 NOTE — Progress Notes (Deleted)
[facility] [address] [place]. [place] [address] [phone]       HOSPITAL FOLLOW UP NOTE  [name] Date of Birth:  01-Apr-1961 Medical Record Number:  [id number]   Reason for Referral:  hospital stroke follow up    CHIEF COMPLAINT:  No chief complaint on file.   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  History obtained from *** and chart review. Reviewed all radiology images and labs personally.  [name] with history of HTN, HLD, sciatica and tobacco use  presented on 2020 with balance issues, weakness, dizziness s/p fall today.   Stroke work-up showed small acute infarct posterior limb internal capsule on the left as evidenced on MRI secondary to small vessel disease.  She did not receive IV t-PA due to late presentation (>4.5 hours from time of onset).   CTA head/neck unremarkable.  2D echo unremarkable.  LDL 52.  A1c 5.5.  UDS positive for cocaine.  Recommended DAPT for 3 weeks and aspirin alone.  HTN stable and recommended long-term BP goal normotensive range.  Initiated atorvastatin 40 mg daily.  No evidence or history of DM.  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  Cocaine cessation counseling provided.  Tobacco abuse with smoking cessation counseling provided.  Other stroke risk factors include EtOH use but no prior history of stroke.  Residual deficits of dysarthria, right facial weakness and right hemiparesis and discharged home with recommendation of outpatient therapies.      ROS:   14 system review of systems performed and negative with exception of ***  PMH:  Past Medical History:  Diagnosis Date  . Anxiety   . Chronic bronchitis (HCC)   . DDD (degenerative disc disease), lumbosacral   . DJD  (degenerative joint disease)   . Hyperlipidemia   . Hypertension   . Sciatica     PSH:  Past Surgical History:  Procedure Laterality Date  . HERNIA REPAIR      Social History:  Social History   Socioeconomic History  . Marital status: Single    Spouse name: Not on file  . Number of children: Not on file  . Years of education: Not on file  . Highest education level: Not on file  Occupational History  . Not on file  Social Needs  . Financial resource strain: Not on file  . Food insecurity    Worry: Not on file    Inability: Not on file  . Transportation needs    Medical: Not on file    Non-medical: Not on file  Tobacco Use  . Smoking status: Current Every Day Smoker    Packs/day: 0.50    Years: 40.00    Pack years: 20.00    Types: Cigarettes  . Smokeless tobacco: Never Used  Substance and Sexual Activity  . Alcohol use: Yes    Comment: Drinks 60oz beer daily  . Drug use: No  . Sexual activity: Never  Lifestyle  . Physical activity    Days per week: Not on file    Minutes per session: Not on file  . Stress: Not on file  Relationships  . Social Musician on phone: Not on file    Gets together: Not on file  Attends religious service: Not on file    Active member of club or organization: Not on file    Attends meetings of clubs or organizations: Not on file    Relationship status: Not on file  . Intimate partner violence    Fear of current or ex partner: Not on file    Emotionally abused: Not on file    Physically abused: Not on file    Forced sexual activity: Not on file  Other Topics Concern  . Not on file  Social History Narrative  . Not on file    Family History:  Family History  Problem Relation Age of Onset  . Hypertension Mother     Medications:   Current Outpatient Medications on File Prior to Visit  Medication Sig Dispense Refill  . acetaminophen (TYLENOL) 500 MG tablet Take 1,000-2,000 mg by mouth 2 (two) times daily as  needed (pain).    Marland Kitchen. albuterol (VENTOLIN HFA) 108 (90 Base) MCG/ACT inhaler Inhale 2 puffs into the lungs every 6 (six) hours as needed for wheezing or shortness of breath.    . ALPRAZolam (XANAX) 0.5 MG tablet Take 1 mg by mouth 2 (two) times daily as needed (panic attacks).     Marland Kitchen. amLODipine (NORVASC) 10 MG tablet Take 1 tablet (10 mg total) by mouth every morning.    Marland Kitchen. aspirin EC 81 MG EC tablet Take 1 tablet (81 mg total) by mouth daily. 30 tablet 0  . atorvastatin (LIPITOR) 40 MG tablet Take 1 tablet (40 mg total) by mouth daily at 6 PM. 30 tablet 0  . CALCIUM PO Take 1 tablet by mouth daily.    . Carboxymethylcellulose Sodium (ARTIFICIAL TEARS OP) Place 1 drop into both eyes 2 (two) times daily.    . Cholecalciferol (VITAMIN D3 PO) Take 1 tablet by mouth daily.    Marland Kitchen. CRANBERRY PO Take 1 tablet by mouth daily.    . Cyanocobalamin (VITAMIN B-12 PO) Take 1 tablet by mouth daily.    . diphenhydrAMINE (BENADRYL) 25 MG tablet Take 50 mg by mouth 2 (two) times daily as needed for allergies.    . hydrochlorothiazide (HYDRODIURIL) 25 MG tablet Take 1 tablet (25 mg total) by mouth every morning.    Marland Kitchen. HYDROcodone-acetaminophen (NORCO) 5-325 MG tablet Take 1 tablet by mouth every 6 (six) hours as needed for severe pain. 10 tablet 0  . lidocaine (LIDODERM) 5 % Place 1 patch onto the skin daily. Remove & Discard patch within 12 hours or as directed by MD (Patient not taking: Reported on 01/16/2019) 30 patch 0  . lisinopril (ZESTRIL) 40 MG tablet Take 1 tablet (40 mg total) by mouth every morning.    . Multiple Vitamin (MULTIVITAMIN WITH MINERALS) TABS tablet Take 1 tablet by mouth daily.    Marland Kitchen. triamcinolone cream (KENALOG) 0.1 % Apply 1 application topically 2 (two) times daily. (Patient not taking: Reported on 01/16/2019) 30 g 0  . VITAMIN E PO Take 1 capsule by mouth daily.     No current facility-administered medications on file prior to visit.     Allergies:   Allergies  Allergen Reactions  . Apple  Itching    Reaction to red apple  . Citrus Itching  . Peach [Prunus Persica] Itching    Reaction to peaches with skin on  . Pineapple Itching  . Plum Pulp Itching     Physical Exam  There were no vitals filed for this visit. There is no height or weight on file  to calculate BMI. No exam data present  No flowsheet data found.   General: well developed, well nourished, seated, in no evident distress Head: head normocephalic and atraumatic.   Neck: supple with no carotid or supraclavicular bruits Cardiovascular: regular rate and rhythm, no murmurs Musculoskeletal: no deformity Skin:  no rash/petichiae Vascular:  Normal pulses all extremities   Neurologic Exam Mental Status: Awake and fully alert. Oriented to place and time. Recent and remote memory intact. Attention span, concentration and fund of knowledge appropriate. Mood and affect appropriate.  Cranial Nerves: Fundoscopic exam reveals sharp disc margins. Pupils equal, briskly reactive to light. Extraocular movements full without nystagmus. Visual fields full to confrontation. Hearing intact. Facial sensation intact. Face, tongue, palate moves normally and symmetrically.  Motor: Normal bulk and tone. Normal strength in all tested extremity muscles. Sensory.: intact to touch , pinprick , position and vibratory sensation.  Coordination: Rapid alternating movements normal in all extremities. Finger-to-nose and heel-to-shin performed accurately bilaterally. Gait and Station: Arises from chair without difficulty. Stance is normal. Gait demonstrates normal stride length and balance Reflexes: 1+ and symmetric. Toes downgoing.     NIHSS  *** Modified Rankin  *** CHA2DS2-VASc *** HAS-BLED ***   Diagnostic Data (Labs, Imaging, Testing)  CTA head:  1. No intracranial large vessel occlusion or proximal high-grade arterial stenosis.  2. Mild intracranial atherosclerotic disease as described.  3. Extensive partial opacification of  the right nasal passage which may reflect secretions or polypoid soft tissue. Correlate clinically and consider direct visualization.   CTA neck:  1. Common carotid, internal carotid and vertebral arteries patent within the neck bilaterally without significant stenosis (50% or greater). Mild atherosclerosis within the visualized aortic arch.  2. Medialized course of the common carotid arteries bilaterally. Partially retropharyngeal course of the right ICA.  3. Medialized course of the caudal left internal jugular vein.   CT head:  Study within normal limits.   CT cervical spine:  Areas of osteoarthritic change, primarily at C4-5 and C5-6. No fracture or spondylolisthesis. Apical lung scarring noted.    Mr Brain Wo Contrast 01/16/2019 IMPRESSION:  Small acute infarct posterior limb internal capsule on the left Mild chronic microvascular ischemic change in the white matter and pons.   Transthoracic Echocardiogram  Pending  ECG - SR rate 70 BPM. LVH (See cardiology reading for complete details)   ASSESSMENT: Katrina Schmidt is a 58 y.o. year old female presented with balance issues, weakness and dizziness on 01/17/2019 with stroke work-up revealing small acute left PLIC infarct secondary to small vessel disease. Vascular risk factors include HTN, HLD, tobacco use, and recent cocaine use.     PLAN:  1. Left PL IC stroke: Continue aspirin 81 mg daily  and atorvastatin for secondary stroke prevention. Maintain strict control of hypertension with blood pressure goal below 130/90, diabetes with hemoglobin A1c goal below 6.5% and cholesterol with LDL cholesterol (bad cholesterol) goal below 70 mg/dL.  I also advised the patient to eat a healthy diet with plenty of whole grains, cereals, fruits and vegetables, exercise regularly with at least 30 minutes of continuous activity daily and maintain ideal body weight. 2. HTN: Advised to continue current treatment regimen.  Today's BP ***.   Advised to continue to monitor at home along with continued follow-up with PCP for management 3. HLD: Advised to continue current treatment regimen along with continued follow-up with PCP for future prescribing and monitoring of lipid panel 4. Substance/tobacco abuse:    Follow up in ***  or call earlier if needed   Greater than 50% of time during this 45 minute visit was spent on counseling, explanation of diagnosis of ***, reviewing risk factor management of ***, planning of further management along with potential future management, and discussion with patient and family answering all questions.    Ihor Austin, AGNP-BC  [facility] [address] [place], [place] [address]  Phone [phone] Fax [phone] Note: This document was prepared with digital dictation and possible smart phrase technology. Any transcriptional errors that result from this process are unintentional.

## 2019-02-26 NOTE — Telephone Encounter (Signed)
Patient was a no call/no show for their appointment today.   

## 2019-03-03 ENCOUNTER — Ambulatory Visit: Payer: Medicaid Other | Admitting: Physical Therapy

## 2019-03-05 ENCOUNTER — Ambulatory Visit: Payer: Medicaid Other | Admitting: Rehabilitation

## 2019-03-05 ENCOUNTER — Other Ambulatory Visit: Payer: Self-pay

## 2019-03-05 ENCOUNTER — Encounter: Payer: Self-pay | Admitting: Rehabilitation

## 2019-03-05 DIAGNOSIS — M6281 Muscle weakness (generalized): Secondary | ICD-10-CM | POA: Diagnosis not present

## 2019-03-05 DIAGNOSIS — I69351 Hemiplegia and hemiparesis following cerebral infarction affecting right dominant side: Secondary | ICD-10-CM

## 2019-03-05 DIAGNOSIS — R2689 Other abnormalities of gait and mobility: Secondary | ICD-10-CM

## 2019-03-05 NOTE — Therapy (Signed)
[facility] [address] [place], [place], [address] Phone: [phone]   Fax:  [phone]  Physical Therapy Treatment  Patient Details  Name: Katrina Schmidt MRN: [id number] Date of Birth: 06-18-1960 Referring Provider (PT): Mariel Aloe, MD   Encounter Date: 03/05/2019  PT End of Session - 03/05/19 1404    Visit Number  3    Number of Visits  12    Date for PT Re-Evaluation  05/05/19    Authorization Type  [facility] has approaved 3 visits 11/2 to 11/22    Authorization - Visit Number  2    Authorization - Number of Visits  3    PT Start Time  1325    PT Stop Time  1402    PT Time Calculation (min)  37 min    Equipment Utilized During Treatment  Gait belt    Activity Tolerance  Patient tolerated treatment well    Behavior During Therapy  [facility] for tasks assessed/performed       Past Medical History:  Diagnosis Date  . Anxiety   . Chronic bronchitis ([place])   . DDD (degenerative disc disease), lumbosacral   . DJD (degenerative joint disease)   . Hyperlipidemia   . Hypertension   . Sciatica     Past Surgical History:  Procedure Laterality Date  . HERNIA REPAIR      There were no vitals filed for this visit.  Subjective Assessment - 03/05/19 1328    Subjective  Reports some stumbling, relates it to cold weather.    Pertinent History  CVA, medical history significant of hypertension, hyperlipidemia, degenerative disc disease, sciatica, panic attacks, tobacco abuse, alcohol abuse,    Limitations  Lifting;Standing;Walking;House hold activities    Diagnostic tests  MRI brain came back positive for small acute infarct posterior limb internal capsule on the left.    Patient Stated Goals  improve balance and Rt leg strength    Currently in Pain?  Yes    Pain Score  9     Pain Location  Generalized   mostly waist down   Pain Descriptors / Indicators  Aching    Pain Type  Chronic pain    Pain Onset  More than a  month ago    Pain Frequency  Constant    Aggravating Factors   late at night    Pain Relieving Factors  is trying to get a heating pad               Access Code: [place]  URL: [email or web address]  Date: 03/05/2019  Prepared by: Cameron Sprang   Exercises Standing Marching - 10 reps - 1-3 sets - 2x daily - 6x weekly Standing Hip Abduction - 10 reps - 1-2 sets - 2x daily - 6x weekly Sit to Stand without Arm Support - 10 reps - 1-2 sets - 2x daily - 6x weekly Heel Toe Raises with Counter Support - 10 reps - 2 sets - 2x daily - 6x weekly Standing Tandem Balance with Counter Support - 3 reps - 1 sets - 30 hold - 2x daily - 6x weekly Side Stepping with Counter Support - 3-5 reps - 1 sets - 2x daily - 6x weekly Single Knee to Chest Stretch - 2 reps - 1 sets - 30-60 hold - 1x daily - 7x weekly Supine Figure 4 Piriformis Stretch - 2 reps - 1 sets - 20 hold - 1x daily - 7x weekly  [facility] Adult PT Treatment/Exercise - 03/05/19 1350      Transfers   Five time sit to stand comments   29.44 secs with hands on lap    Comments  Pt with increased pain with sit<>stand in low back.       Standardized Balance Assessment   Standardized Balance Assessment  Berg Balance Test      Berg Balance Test   Sit to Stand  Able to stand  independently using hands    Standing Unsupported  Able to stand safely 2 minutes    Sitting with Back Unsupported but Feet Supported on Floor or Stool  Able to sit safely and securely 2 minutes    Stand to Sit  Sits safely with minimal use of hands    Transfers  Able to transfer safely, definite need of hands    Standing Unsupported with Eyes Closed  Able to stand 10 seconds with supervision    Standing Ubsupported with Feet Together  Able to place feet together independently and stand for 1 minute with supervision    From Standing, Reach Forward with Outstretched Arm  Can reach forward >12 cm safely (5")    From Standing Position, Pick up  Object from Floor  Able to pick up shoe, needs supervision    From Standing Position, Turn to Look Behind Over each Shoulder  Looks behind from both sides and weight shifts well    Turn 360 Degrees  Able to turn 360 degrees safely in 4 seconds or less    Standing Unsupported, Alternately Place Feet on Step/Stool  Able to complete >2 steps/needs minimal assist    Standing Unsupported, One Foot in Front  Able to plae foot ahead of the other independently and hold 30 seconds    Standing on One Leg  Tries to lift leg/unable to hold 3 seconds but remains standing independently    Total Score  43             PT Education - 03/05/19 1331    Education Details  plan to ask for 8 more visits, recommendations to speak with neurologist regarding back pain and numbness in BLEs.    Person(s) Educated  Patient    Methods  Explanation    Comprehension  Verbalized understanding       PT Short Term Goals - 03/05/19 1331      PT SHORT TERM GOAL #1   Title  Pt will be I and compliant with initial HEP. (Target for all STG 4 weeks 03/12/19)    Baseline  no HEP until today    Status  New      PT SHORT TERM GOAL #2   Title  Pt will improve BERG balance score by 3 points to show improved balance.    Baseline  41 to 43/56    Status  Partially Met      PT SHORT TERM GOAL #3   Title  Pt will improve 5TSTS test by 3 sec to show improved leg strength and balance.    Baseline  25 sec, increased to 29.44 secs on 03/05/19    Status  New        PT Long Term Goals - 02/10/19 1734      PT LONG TERM GOAL #1   Title  Pt will improve BERG by 6 points to show improved balance. (Target for all LTG 12 weeks 05/05/2019)    Baseline  41    Status  New  PT LONG TERM GOAL #2   Title  Pt will improve 5TSTS test by 10 sec to show improved balance and strength.    Baseline  25 sec    Status  New      PT LONG TERM GOAL #3   Title  Pt will improve TUG by 5 seconds with LRAD to show improved gait speed and  balance.    Baseline  19 sec used rollator    Status  New      PT LONG TERM GOAL #4   Title  Pt will be able to ambulate community distances at least 500 ft with LRAD with improved gait pattern and foot clearance.    Baseline  only able to walk 100 ft at most needs rolloator and supervision due to poor balance and foot clearance.    Status  New            Plan - 03/05/19 1405    Clinical Impression Statement  Skilled session focused on assessment of STGs.  Note she has partially met STG 1 for HEP and STG 2 for BERG with 2 point improvement, however had an increase in 5TSS due to back pain.  Note that she is going to reschedule her visit at [facility] (she no showed to previous apt).  Recommended she speak with them regarding back pain as it sounds like she is having increasing numbness in low back.  She is also limited with mobility due to low back pain.    Personal Factors and Comorbidities  Comorbidity 1;Comorbidity 2;Comorbidity 3+    Comorbidities  CVA, hypertension, hyperlipidemia, degenerative disc disease, sciatica, panic attacks, tobacco abuse, alcohol abuse    Examination-Activity Limitations  Locomotion Level;Carry;Squat;Stairs;Lift;Stand    Examination-Participation Restrictions  Meal Prep;Cleaning;Community Activity;Laundry;Shop    Stability/Clinical Decision Making  Evolving/Moderate complexity    Rehab Potential  Good    PT Frequency  Other (comment)   1-2   PT Duration  12 weeks    PT Treatment/Interventions  ADLs/Self Care Home Management;Aquatic Therapy;Moist Heat;Cryotherapy;Gait training;Stair training;Therapeutic activities;Therapeutic exercise;Balance training;Neuromuscular re-education;Manual techniques;Passive range of motion    PT Next Visit Plan  Continue R LE NMR, also can address low back pain as this greatly impacts her balance.    PT Home Exercise Plan  hypertension, hyperlipidemia, degenerative disc disease, sciatica, panic attacks, tobacco abuse, alcohol abuse     Consulted and Agree with Plan of Care  Patient       Patient will benefit from skilled therapeutic intervention in order to improve the following deficits and impairments:  Abnormal gait, Cardiopulmonary status limiting activity, Decreased activity tolerance, Decreased balance, Decreased coordination, Decreased mobility, Decreased strength, Decreased range of motion, Difficulty walking, Postural dysfunction, Pain  Visit Diagnosis: Muscle weakness (generalized)  Other abnormalities of gait and mobility  Hemiplegia and hemiparesis following cerebral infarction affecting right dominant side [facility])     Problem List Patient Active Problem List   Diagnosis Date Noted  . CVA (cerebral vascular accident) ([place]) 01/16/2019  . Hypertension 01/16/2019  . Hyperlipidemia 01/16/2019  . Tobacco abuse 01/16/2019  . Alcohol abuse 01/16/2019  . Sciatica 01/16/2019  . Panic attacks 01/16/2019    Cameron Sprang, PT, MPT [facility] [address] [place], [place], [address] Phone: [phone]   Fax:  [phone] 03/05/19, 3:43 PM  Name: Katrina Schmidt MRN: [id number] Date of Birth: 1961/01/23

## 2019-03-09 ENCOUNTER — Ambulatory Visit (INDEPENDENT_AMBULATORY_CARE_PROVIDER_SITE_OTHER): Payer: Medicaid Other | Admitting: Adult Health

## 2019-03-09 ENCOUNTER — Encounter: Payer: Self-pay | Admitting: Adult Health

## 2019-03-09 ENCOUNTER — Other Ambulatory Visit: Payer: Self-pay

## 2019-03-09 VITALS — BP 158/108 | HR 106 | Temp 97.8°F | Ht 65.0 in | Wt 162.0 lb

## 2019-03-09 DIAGNOSIS — E785 Hyperlipidemia, unspecified: Secondary | ICD-10-CM

## 2019-03-09 DIAGNOSIS — R2689 Other abnormalities of gait and mobility: Secondary | ICD-10-CM | POA: Diagnosis not present

## 2019-03-09 DIAGNOSIS — I63532 Cerebral infarction due to unspecified occlusion or stenosis of left posterior cerebral artery: Secondary | ICD-10-CM | POA: Diagnosis not present

## 2019-03-09 DIAGNOSIS — I10 Essential (primary) hypertension: Secondary | ICD-10-CM

## 2019-03-09 DIAGNOSIS — F1010 Alcohol abuse, uncomplicated: Secondary | ICD-10-CM

## 2019-03-09 DIAGNOSIS — Z720 Tobacco use: Secondary | ICD-10-CM

## 2019-03-09 DIAGNOSIS — M5416 Radiculopathy, lumbar region: Secondary | ICD-10-CM | POA: Diagnosis not present

## 2019-03-09 NOTE — Progress Notes (Signed)
[facility] [address] [place]. [place] [address] [phone]       HOSPITAL FOLLOW UP NOTE  [name] Date of Birth:  12-Jan-1961 Medical Record Number:  [id number]   Reason for Referral:  hospital stroke follow up    CHIEF COMPLAINT:  Chief Complaint  Patient presents with  . Follow-up    Treatment room, alone. Stumbling a bit.     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  History obtained from patient and chart review. Reviewed all radiology images and labs personally.  [name] is a 59 y.o. female with history of HTN, HLD, sciatica and tobacco use  presented on 2020 with balance issues, weakness, dizziness s/p fall today.   Stroke work-up showed small acute infarct posterior limb internal capsule on the left as evidenced on MRI secondary to small vessel disease.  She did not receive IV t-PA due to late presentation (>4.5 hours from time of onset).   CTA head/neck unremarkable.  2D echo unremarkable.  LDL 52.  A1c 5.5.  UDS positive for cocaine.  Recommended DAPT for 3 weeks and aspirin alone.  HTN stable and recommended long-term BP goal normotensive range.  Initiated atorvastatin 40 mg daily.  No evidence or history of DM.  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  Cocaine cessation counseling provided.  Tobacco abuse with smoking cessation counseling provided.  Other stroke risk factors include EtOH use but no prior history of stroke.  Residual deficits of dysarthria, right facial weakness and right hemiparesis and discharged home with recommendation of outpatient therapies.  [name] Is a 58 year old female being seen today for hospital follow up. Residual deficits right sided weakness and imbalance.  She continues to participate in  outpatient physical therapy with ongoing improvement.  She does have history of lower back pain with left-sided sciatica and radiculopathy.  She reports lower back pain slowly worsening and has been interfering with improvement of balance.  She continues to use Rollator walker for ambulation.  She has not previously been evaluated by orthopedics or neurosurgery for lower back pain and is currently on hydrocodone-acetaminophen without great benefit.  She has trialed gabapentin in the past without benefit.  She has completed 3 weeks DAPT and continues on aspirin alone without bleeding or bruising.  Continues on atorvastatin without myalgias.  Blood pressure today elevated at 152/108.  She does not routinely monitor at home but does plan obtaining blood pressure cuff in order to start monitoring.  She does endorse slowly decreasing tobacco usage but also largely depends on the day and stress level.  She continues to consume approximately 60 ounces of beer at least 5 days/week.  She denies any additional substance or recreational drug abuse.  No further concerns at this time.     ROS:   14 system review of systems performed and negative with exception of pain, weakness, and balance  PMH:  Past Medical History:  Diagnosis Date  . Anxiety   . Chronic bronchitis (HCC)   . DDD (degenerative disc disease), lumbosacral   . DJD (degenerative joint disease)   . Hyperlipidemia   . Hypertension   . Sciatica     PSH:  Past Surgical History:  Procedure Laterality Date  . HERNIA REPAIR  Social History:  Social History   Socioeconomic History  . Marital status: Single    Spouse name: Not on file  . Number of children: Not on file  . Years of education: Not on file  . Highest education level: Not on file  Occupational History  . Not on file  Social Needs  . Financial resource strain: Not on file  . Food insecurity    Worry: Not on file    Inability: Not on file  . Transportation needs     Medical: Not on file    Non-medical: Not on file  Tobacco Use  . Smoking status: Current Every Day Smoker    Packs/day: 0.50    Years: 40.00    Pack years: 20.00    Types: Cigarettes  . Smokeless tobacco: Never Used  Substance and Sexual Activity  . Alcohol use: Yes    Comment: Drinks 60oz beer daily  . Drug use: No  . Sexual activity: Never  Lifestyle  . Physical activity    Days per week: Not on file    Minutes per session: Not on file  . Stress: Not on file  Relationships  . Social Herbalist on phone: Not on file    Gets together: Not on file    Attends religious service: Not on file    Active member of club or organization: Not on file    Attends meetings of clubs or organizations: Not on file    Relationship status: Not on file  . Intimate partner violence    Fear of current or ex partner: Not on file    Emotionally abused: Not on file    Physically abused: Not on file    Forced sexual activity: Not on file  Other Topics Concern  . Not on file  Social History Narrative  . Not on file    Family History:  Family History  Problem Relation Age of Onset  . Hypertension Mother     Medications:   Current Outpatient Medications on File Prior to Visit  Medication Sig Dispense Refill  . albuterol (VENTOLIN HFA) 108 (90 Base) MCG/ACT inhaler Inhale 2 puffs into the lungs every 6 (six) hours as needed for wheezing or shortness of breath.    . ALPRAZolam (XANAX) 0.5 MG tablet Take 1 mg by mouth 2 (two) times daily as needed (panic attacks).     Marland Kitchen amLODipine (NORVASC) 10 MG tablet Take 1 tablet (10 mg total) by mouth every morning.    Marland Kitchen aspirin 325 MG tablet Take 81 mg by mouth daily.    Marland Kitchen atorvastatin (LIPITOR) 40 MG tablet Take 1 tablet (40 mg total) by mouth daily at 6 PM. 30 tablet 0  . Carboxymethylcellulose Sodium (ARTIFICIAL TEARS OP) Place 1 drop into both eyes 2 (two) times daily.    Marland Kitchen CRANBERRY PO Take 1 tablet by mouth daily.    . Cyanocobalamin  (VITAMIN B-12 PO) Take 1 tablet by mouth daily.    . diphenhydrAMINE (BENADRYL) 25 MG tablet Take 50 mg by mouth 2 (two) times daily as needed for allergies.    . hydrochlorothiazide (HYDRODIURIL) 25 MG tablet Take 1 tablet (25 mg total) by mouth every morning.    Marland Kitchen HYDROcodone-acetaminophen (NORCO) 5-325 MG tablet Take 1 tablet by mouth every 6 (six) hours as needed for severe pain. 10 tablet 0  . lidocaine (LIDODERM) 5 % Place 1 patch onto the skin daily. Remove & Discard patch within 12 hours or  as directed by MD 30 patch 0  . lisinopril (ZESTRIL) 40 MG tablet Take 1 tablet (40 mg total) by mouth every morning.    . Multiple Vitamin (MULTIVITAMIN WITH MINERALS) TABS tablet Take 1 tablet by mouth daily.    . simvastatin (ZOCOR) 10 MG tablet Take 10 mg by mouth daily.    Marland Kitchen triamcinolone cream (KENALOG) 0.1 % Apply 1 application topically 2 (two) times daily. 30 g 0  . VITAMIN E PO Take 1 capsule by mouth daily.    Marland Kitchen acetaminophen (TYLENOL) 500 MG tablet Take 1,000-2,000 mg by mouth 2 (two) times daily as needed (pain).    . CALCIUM PO Take 1 tablet by mouth daily.    . Cholecalciferol (VITAMIN D3 PO) Take 1 tablet by mouth daily.     No current facility-administered medications on file prior to visit.     Allergies:   Allergies  Allergen Reactions  . Apple Itching    Reaction to red apple  . Citrus Itching  . Peach [Prunus Persica] Itching    Reaction to peaches with skin on  . Pineapple Itching  . Plum Pulp Itching     Physical Exam  Vitals:   03/09/19 1515 03/09/19 1522  BP: (!) 164/110 (!) 158/108  Pulse: (!) 106   Temp: 97.8 F (36.6 C)   Weight: 162 lb (73.5 kg)   Height:  (1.651 m)    Body mass index is 26.96 kg/m. No exam data present  General:  pleasant middle-aged African-American female, poor dentition, seated, in no evident distress Head: head normocephalic and atraumatic.   Neck: supple with no carotid or supraclavicular bruits Cardiovascular: regular  rate and rhythm, no murmurs Musculoskeletal: no deformity Skin:  no rash/petichiae Vascular:  Normal pulses all extremities   Neurologic Exam Mental Status: Awake and fully alert.   Possible mild dysarthria but patient believes baseline with evidence of poor dentition.  Oriented to place and time. Recent and remote memory intact. Attention span, concentration and fund of knowledge appropriate. Mood and affect appropriate.  Cranial Nerves: Fundoscopic exam reveals sharp disc margins. Pupils equal, briskly reactive to light. Extraocular movements full without nystagmus. Visual fields full to confrontation. Hearing intact. Facial sensation intact. Face, tongue, palate moves normally and symmetrically.  Motor: Normal bulk and tone. Normal strength in all tested extremity muscles except mildly decreased right hip flexor weakness. Sensory.: intact to touch , pinprick , position and vibratory sensation.  Coordination: Rapid alternating movements normal in all extremities except mildly decreased right hand dexterity. Finger-to-nose and heel-to-shin performed accurately bilaterally. Gait and Station: Arises from chair without difficulty. Stance is normal. Gait demonstrates normal stride length and mild imbalance with use of Rollator walker Reflexes: 1+ and symmetric. Toes downgoing.     NIHSS  0 Modified Rankin  2    Diagnostic Data (Labs, Imaging, Testing)  CTA head:  1. No intracranial large vessel occlusion or proximal high-grade arterial stenosis.  2. Mild intracranial atherosclerotic disease as described.  3. Extensive partial opacification of the right nasal passage which may reflect secretions or polypoid soft tissue. Correlate clinically and consider direct visualization.   CTA neck:  1. Common carotid, internal carotid and vertebral arteries patent within the neck bilaterally without significant stenosis (50% or greater). Mild atherosclerosis within the visualized aortic arch.  2.  Medialized course of the common carotid arteries bilaterally. Partially retropharyngeal course of the right ICA.  3. Medialized course of the caudal left internal jugular vein.   CT  head:  Study within normal limits.   CT cervical spine:  Areas of osteoarthritic change, primarily at C4-5 and C5-6. No fracture or spondylolisthesis. Apical lung scarring noted.    Mr Brain Wo Contrast 01/16/2019 IMPRESSION:  Small acute infarct posterior limb internal capsule on the left Mild chronic microvascular ischemic change in the white matter and pons.   ECG - SR rate 70 BPM. LVH (See cardiology reading for complete details)   ASSESSMENT: Katrina Schmidt is a 58 y.o. year old female presented with balance issues, weakness and dizziness on 01/17/2019 with stroke work-up revealing small acute left PLIC infarct secondary to small vessel disease. Vascular risk factors include HTN, HLD, tobacco use, and recent cocaine use.  Residual deficits mild right hemiparesis and imbalance with chronic lower back radiculopathy slowly worsening and interfering with therapy    PLAN:  1. Left PL IC stroke: Continue aspirin 81 mg daily  and atorvastatin for secondary stroke prevention. Maintain strict control of hypertension with blood pressure goal below 130/90, diabetes with hemoglobin A1c goal below 6.5% and cholesterol with LDL cholesterol (bad cholesterol) goal below 70 mg/dL.  I also advised the patient to eat a healthy diet with plenty of whole grains, cereals, fruits and vegetables, exercise regularly with at least 30 minutes of continuous activity daily and maintain ideal body weight.  Advised to continue to work with outpatient therapies for ongoing balance deficit post stroke 2. HTN: Advised to continue current treatment regimen.  Today's BP elevated and recommended obtaining blood pressure cuff in order to monitor at home.  Advised to continue to follow with PCP for ongoing monitoring management. 3. HLD:  Advised to continue current treatment regimen along with continued follow-up with PCP for future prescribing and monitoring of lipid panel 4. Substance/tobacco abuse: Discussion regarding importance of complete tobacco and alcohol cessation for secondary stroke prevention.  Congratulated her on illicit drug use cessation 5. Left-sided lumbar radiculopathy: Pain continued over past 3 years which has been gradually worsening despite prior conservative treatment measures.  Currently interfering with therapy with recent stroke affecting balance.  Will obtain MRI lumbar to rule out underlying abnormalities causing continued radiculopathy with potential referral to neurosurgery or orthopedics if indicated    Follow up in 3 months or call earlier if needed   Greater than 50% of time during this 45 minute visit was spent on counseling, explanation of diagnosis of left PL IC stroke, reviewing risk factor management of HTN, HLD, substance abuse, tobacco use and illicit drug use, discussion regarding chronic left-sided lumbar radiculopathy and obtaining imaging with potential need referral if indicated, planning of further management along with potential future management, and discussion with patient and family answering all questions.    Ihor AustinJessica McCue, AGNP-BC  [facility] [address] [place], [place] [id number]  Phone [phone] Fax [phone] Note: This document was prepared with digital dictation and possible smart phrase technology. Any transcriptional errors that result from this process are unintentional.

## 2019-03-09 NOTE — Patient Instructions (Addendum)
Ongoing participation with physical therapy  You will be called to undergo MRI of your lumbar spine to assess for underlying abnormalities causing continued/worsening lower extremity pain  Continue aspirin 81 mg daily (okay to lower dose to 81 mg daily) and atorvastatin for secondary stroke prevention  Continue to follow up with PCP regarding cholesterol and blood pressure management   Continue to monitor blood pressure at home  Recommend complete tobacco cessation and limit alcohol 1-2 drinks per day  Maintain strict control of hypertension with blood pressure goal below 130/90, diabetes with hemoglobin A1c goal below 6.5% and cholesterol with LDL cholesterol (bad cholesterol) goal below 70 mg/dL. I also advised the patient to eat a healthy diet with plenty of whole grains, cereals, fruits and vegetables, exercise regularly and maintain ideal body weight.  Followup in the future with me in 4 months or call earlier if needed       Thank you for coming to see [place] at [facility]. I hope we have been able to provide you high quality care today.  You may receive a patient satisfaction survey over the next few weeks. We would appreciate your feedback and comments so that we may continue to improve ourselves and the health of our patients.

## 2019-03-19 ENCOUNTER — Ambulatory Visit: Payer: Medicaid Other | Admitting: Physical Therapy

## 2019-03-24 ENCOUNTER — Ambulatory Visit: Payer: Medicaid Other | Admitting: Physical Therapy

## 2019-03-24 ENCOUNTER — Other Ambulatory Visit: Payer: Self-pay

## 2019-03-24 DIAGNOSIS — M6281 Muscle weakness (generalized): Secondary | ICD-10-CM | POA: Insufficient documentation

## 2019-03-24 DIAGNOSIS — I69351 Hemiplegia and hemiparesis following cerebral infarction affecting right dominant side: Secondary | ICD-10-CM | POA: Insufficient documentation

## 2019-03-24 DIAGNOSIS — R2689 Other abnormalities of gait and mobility: Secondary | ICD-10-CM | POA: Insufficient documentation

## 2019-03-24 NOTE — Therapy (Signed)
[facility] [address] [place], [place], [address] Phone: [phone]   Fax:  [phone]  Patient Details  Name: Katrina Schmidt MRN: [id number] Date of Birth: 1961/02/16 Referring Provider:  Mariel Aloe, MD  Encounter Date: 03/24/2019  Pt arrived but not seen today for PT. She states that she is in too much back pain and may need to go to ER because her pain is that bad. She also will have lumbar MRI on 04/13/19. She was clinic cancelled today to follow up with MD about her severe back pain and PT will wait until she has MRI results before we will have her resume PT, then we will submit for more MCD visits in January if she is appropriate for PT based on her MRI results.   Debbe Odea 03/24/2019, 3:11 PM  [facility] [address] [place], [place], [address] Phone: [phone]   Fax:  [phone]

## 2019-04-01 ENCOUNTER — Ambulatory Visit: Payer: Medicaid Other

## 2019-04-08 ENCOUNTER — Ambulatory Visit: Payer: Medicaid Other

## 2019-04-13 ENCOUNTER — Other Ambulatory Visit: Payer: Medicaid Other

## 2019-04-15 ENCOUNTER — Ambulatory Visit: Payer: Medicaid Other

## 2019-04-21 ENCOUNTER — Ambulatory Visit: Payer: Medicaid Other

## 2019-04-28 ENCOUNTER — Ambulatory Visit: Payer: Medicaid Other | Attending: Family Medicine

## 2019-05-05 ENCOUNTER — Ambulatory Visit: Payer: Medicaid Other

## 2019-05-25 ENCOUNTER — Emergency Department (HOSPITAL_COMMUNITY): Payer: Medicaid Other

## 2019-05-25 ENCOUNTER — Other Ambulatory Visit: Payer: Self-pay

## 2019-05-25 ENCOUNTER — Emergency Department (HOSPITAL_COMMUNITY)
Admission: EM | Admit: 2019-05-25 | Discharge: 2019-05-26 | Disposition: A | Discharge status: Home / Self Care | Payer: Medicaid Other | Attending: Emergency Medicine | Admitting: Emergency Medicine

## 2019-05-25 DIAGNOSIS — R202 Paresthesia of skin: Secondary | ICD-10-CM | POA: Diagnosis not present

## 2019-05-25 DIAGNOSIS — F17210 Nicotine dependence, cigarettes, uncomplicated: Secondary | ICD-10-CM | POA: Diagnosis not present

## 2019-05-25 DIAGNOSIS — I10 Essential (primary) hypertension: Secondary | ICD-10-CM | POA: Insufficient documentation

## 2019-05-25 DIAGNOSIS — R030 Elevated blood-pressure reading, without diagnosis of hypertension: Secondary | ICD-10-CM

## 2019-05-25 DIAGNOSIS — Z8673 Personal history of transient ischemic attack (TIA), and cerebral infarction without residual deficits: Secondary | ICD-10-CM | POA: Insufficient documentation

## 2019-05-25 DIAGNOSIS — R200 Anesthesia of skin: Secondary | ICD-10-CM | POA: Diagnosis present

## 2019-05-25 DIAGNOSIS — Z79899 Other long term (current) drug therapy: Secondary | ICD-10-CM | POA: Insufficient documentation

## 2019-05-25 LAB — I-STAT CHEM 8, ED
BUN: 17 mg/dL (ref 6–20)
Calcium, Ion: 1.03 mmol/L — ABNORMAL LOW (ref 1.15–1.40)
Chloride: 104 mmol/L (ref 98–111)
Creatinine, Ser: 0.8 mg/dL (ref 0.44–1.00)
Glucose, Bld: 109 mg/dL — ABNORMAL HIGH (ref 70–99)
HCT: 47 % — ABNORMAL HIGH (ref 36.0–46.0)
Hemoglobin: 16 g/dL — ABNORMAL HIGH (ref 12.0–15.0)
Potassium: 4.2 mmol/L (ref 3.5–5.1)
Sodium: 141 mmol/L (ref 135–145)
TCO2: 29 mmol/L (ref 22–32)

## 2019-05-25 LAB — CBC
HCT: 48.2 % — ABNORMAL HIGH (ref 36.0–46.0)
Hemoglobin: 15.8 g/dL — ABNORMAL HIGH (ref 12.0–15.0)
MCH: 31.9 pg (ref 26.0–34.0)
MCHC: 32.8 g/dL (ref 30.0–36.0)
MCV: 97.2 fL (ref 80.0–100.0)
Platelets: 318 10*3/uL (ref 150–400)
RBC: 4.96 MIL/uL (ref 3.87–5.11)
RDW: 13.5 % (ref 11.5–15.5)
WBC: 5.9 10*3/uL (ref 4.0–10.5)
nRBC: 0 % (ref 0.0–0.2)

## 2019-05-25 LAB — COMPREHENSIVE METABOLIC PANEL
ALT: 21 U/L (ref 0–44)
AST: 17 U/L (ref 15–41)
Albumin: 3.8 g/dL (ref 3.5–5.0)
Alkaline Phosphatase: 59 U/L (ref 38–126)
Anion gap: 14 (ref 5–15)
BUN: 14 mg/dL (ref 6–20)
CO2: 26 mmol/L (ref 22–32)
Calcium: 9 mg/dL (ref 8.9–10.3)
Chloride: 102 mmol/L (ref 98–111)
Creatinine, Ser: 0.79 mg/dL (ref 0.44–1.00)
GFR calc Af Amer: >60 mL/min (ref >60)
GFR calc non Af Amer: >60 mL/min (ref >60)
Glucose, Bld: 115 mg/dL — ABNORMAL HIGH (ref 70–99)
Potassium: 4.2 mmol/L (ref 3.5–5.1)
Sodium: 142 mmol/L (ref 135–145)
Total Bilirubin: 0.4 mg/dL (ref 0.3–1.2)
Total Protein: 6.4 g/dL — ABNORMAL LOW (ref 6.5–8.1)

## 2019-05-25 LAB — RAPID URINE DRUG SCREEN, HOSP PERFORMED
Amphetamines: NOT DETECTED
Barbiturates: NOT DETECTED
Benzodiazepines: NOT DETECTED
Cocaine: POSITIVE — AB
Opiates: NOT DETECTED
Tetrahydrocannabinol: NOT DETECTED

## 2019-05-25 LAB — DIFFERENTIAL
Abs Immature Granulocytes: 0.02 10*3/uL (ref 0.00–0.07)
Basophils Absolute: 0 10*3/uL (ref 0.0–0.1)
Basophils Relative: 1 %
Eosinophils Absolute: 0.1 10*3/uL (ref 0.0–0.5)
Eosinophils Relative: 1 %
Immature Granulocytes: 0 %
Lymphocytes Relative: 30 %
Lymphs Abs: 1.8 10*3/uL (ref 0.7–4.0)
Monocytes Absolute: 0.5 10*3/uL (ref 0.1–1.0)
Monocytes Relative: 8 %
Neutro Abs: 3.6 10*3/uL (ref 1.7–7.7)
Neutrophils Relative %: 60 %

## 2019-05-25 LAB — URINALYSIS, ROUTINE W REFLEX MICROSCOPIC
Bilirubin Urine: NEGATIVE
Glucose, UA: NEGATIVE mg/dL
Ketones, ur: NEGATIVE mg/dL
Leukocytes,Ua: NEGATIVE
Nitrite: NEGATIVE
Protein, ur: NEGATIVE mg/dL
Specific Gravity, Urine: 1.02 (ref 1.005–1.030)
pH: 5 (ref 5.0–8.0)

## 2019-05-25 LAB — PROTIME-INR
INR: 1 (ref 0.8–1.2)
Prothrombin Time: 12.6 seconds (ref 11.4–15.2)

## 2019-05-25 LAB — ETHANOL: Alcohol, Ethyl (B): <10 mg/dL (ref <10)

## 2019-05-25 LAB — I-STAT BETA HCG BLOOD, ED (MC, WL, AP ONLY): I-stat hCG, quantitative: <5 m[IU]/mL (ref <5)

## 2019-05-25 LAB — APTT: aPTT: 25 seconds (ref 24–36)

## 2019-05-25 LAB — CBG MONITORING, ED: Glucose-Capillary: 132 mg/dL — ABNORMAL HIGH (ref 70–99)

## 2019-05-25 IMAGING — CT CT HEAD W/O CM
4 series · 17 of 47 positions shown, 19 images · non-contrast
Comparison: [DATE].

CLINICAL DATA: Left facial tingling.

EXAM:
CT HEAD WITHOUT CONTRAST
TECHNIQUE: Contiguous axial images were obtained from the base of the skull
through the vertex without intravenous contrast.

[Series 2: head without · axial · non-contrast · 0.40mm/px · z∈[-58,+62]mm · 7 of 32 slices shown, 9 images]
[im 4/32  brain]
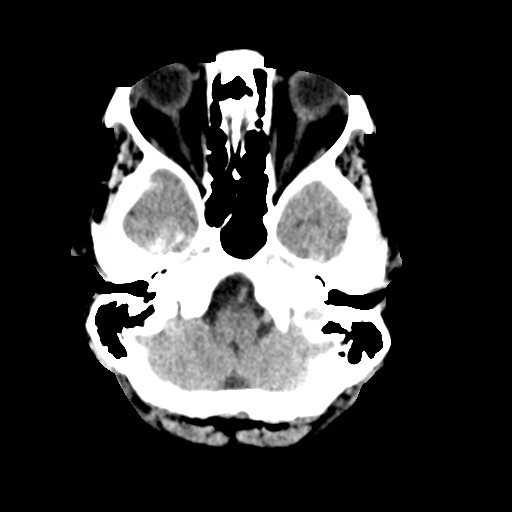
[im 4/32  bone]
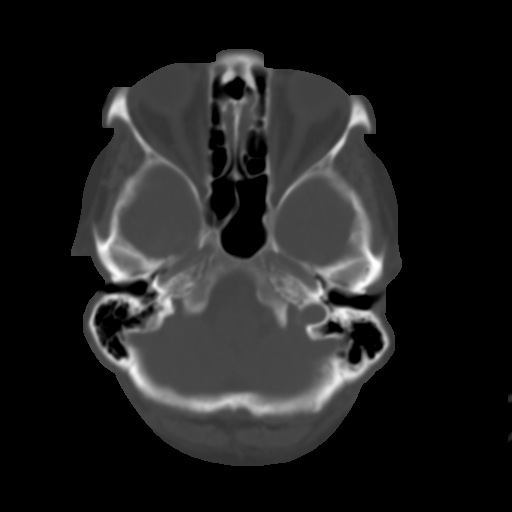
[im 8/32  brain]
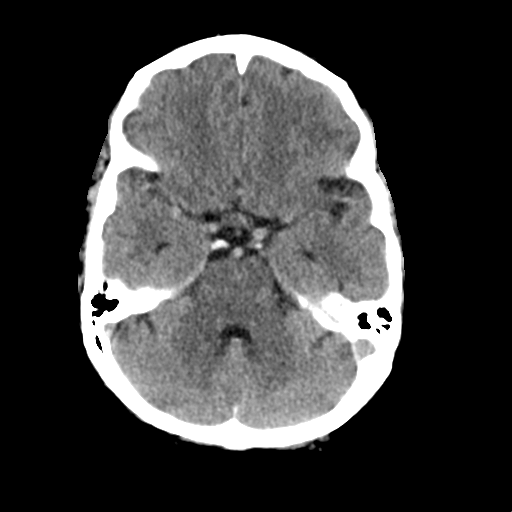
[im 12/32  brain]
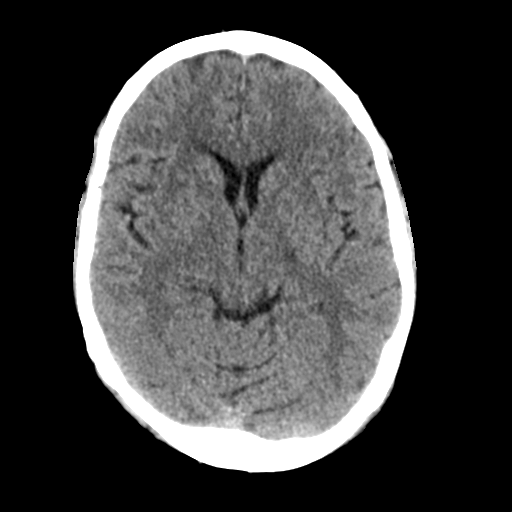
[im 16/32  brain]
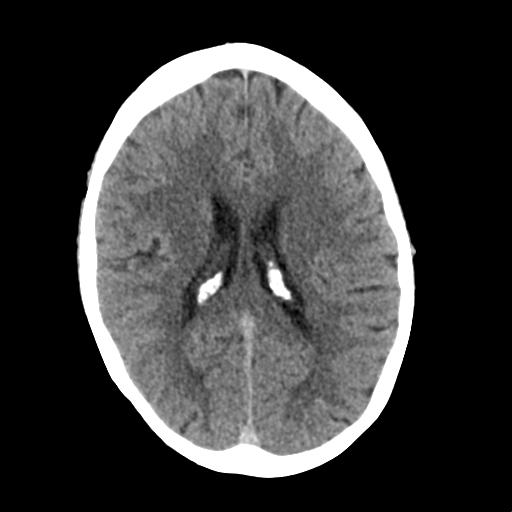
[im 20/32  brain]
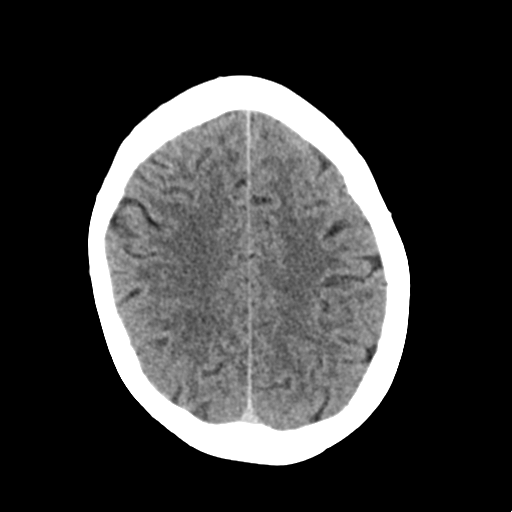
[im 20/32  bone]
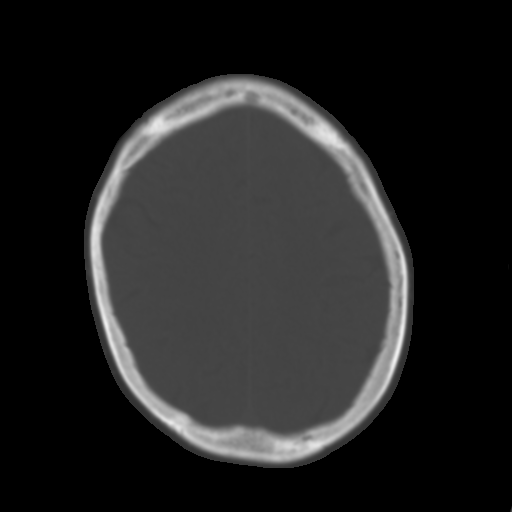
[im 24/32  brain]
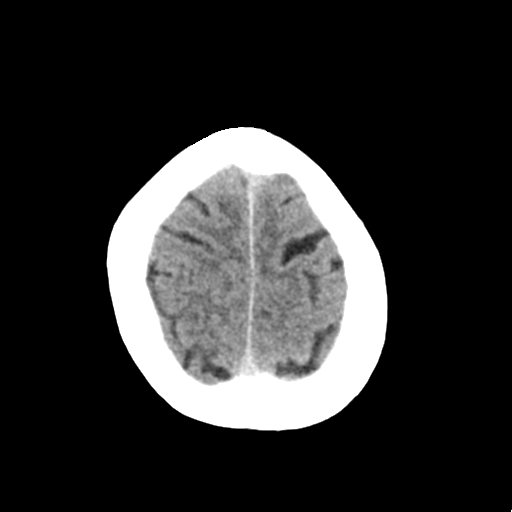
[im 28/32  brain]
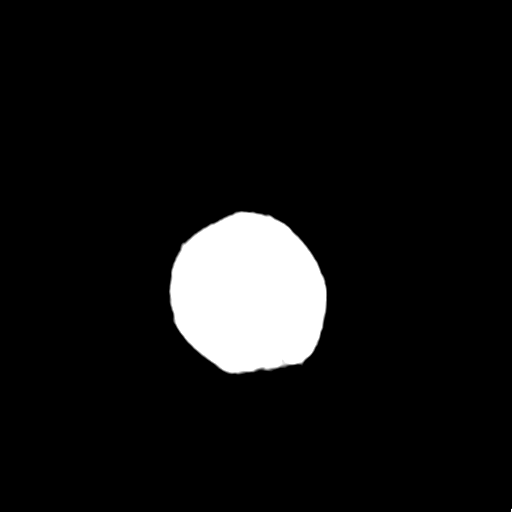

[Series 3: head bone · axial · 0.40mm/px · z∈[-59,-3]mm · 4 of 79 slices shown]
[im 8/79  bone]
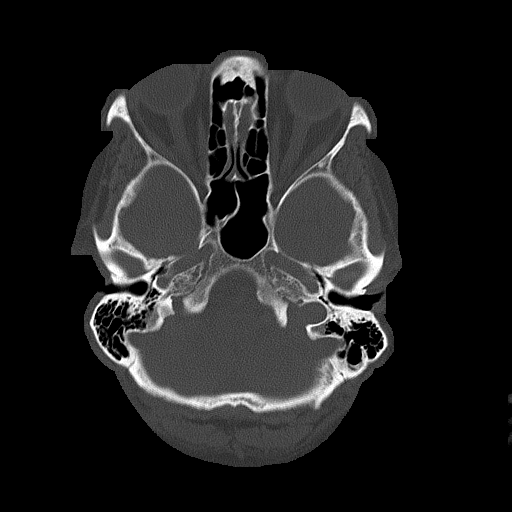
[im 16/79  bone]
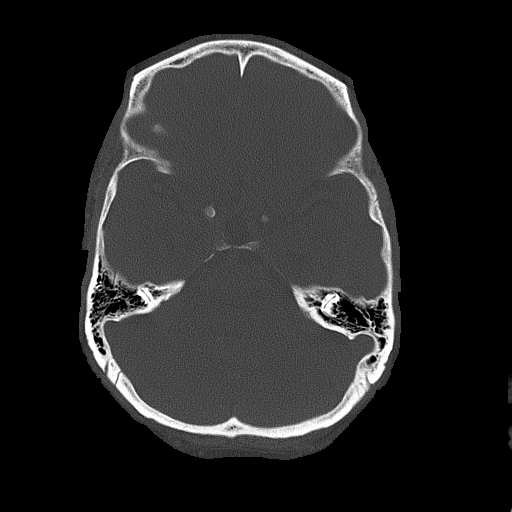
[im 24/79  bone]
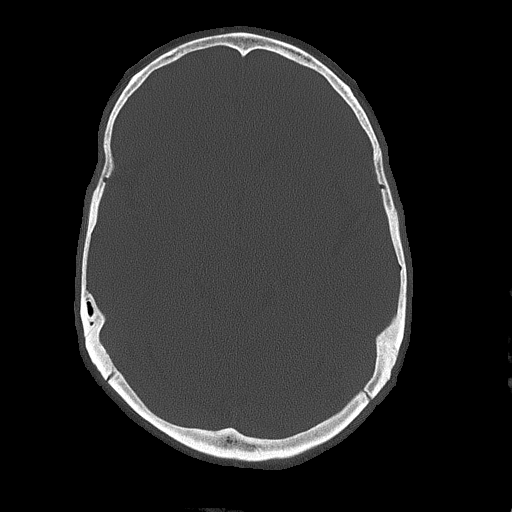
[im 36/79  bone]
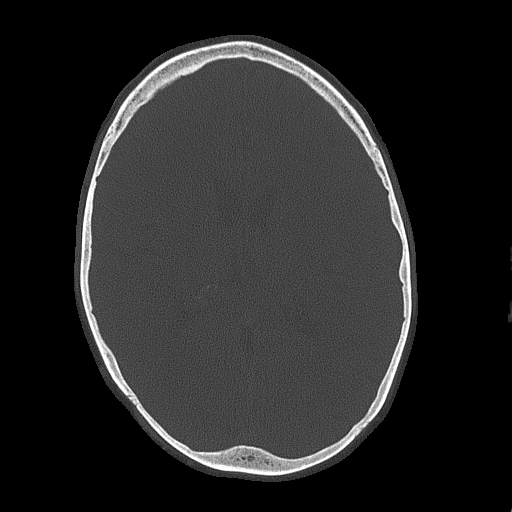

[Series 4: head without cor · coronal · non-contrast · 0.31mm/px · 3 of 67 slices shown]
[im 23/67  brain]
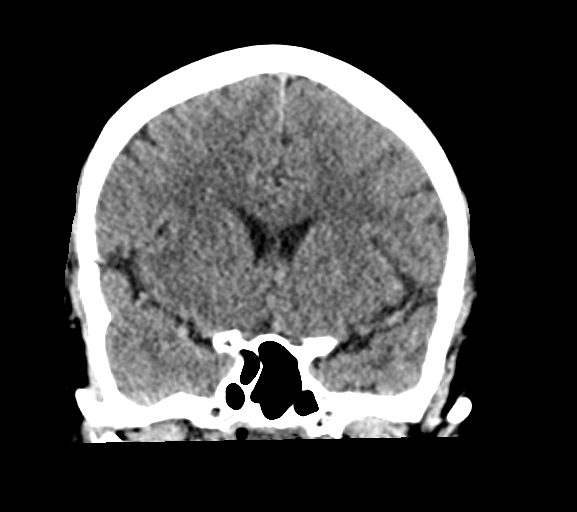
[im 30/67  brain]
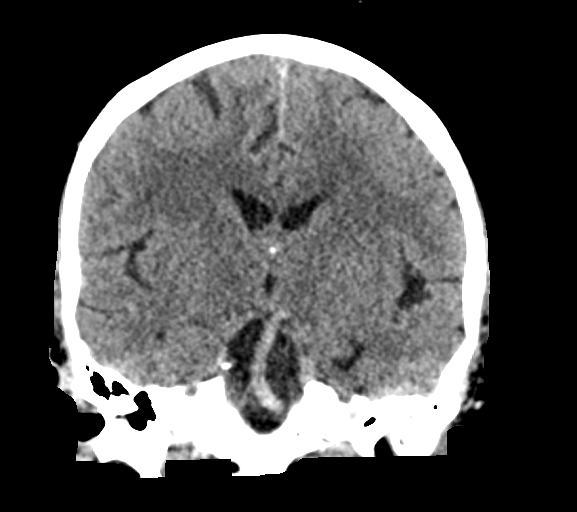
[im 37/67  brain]
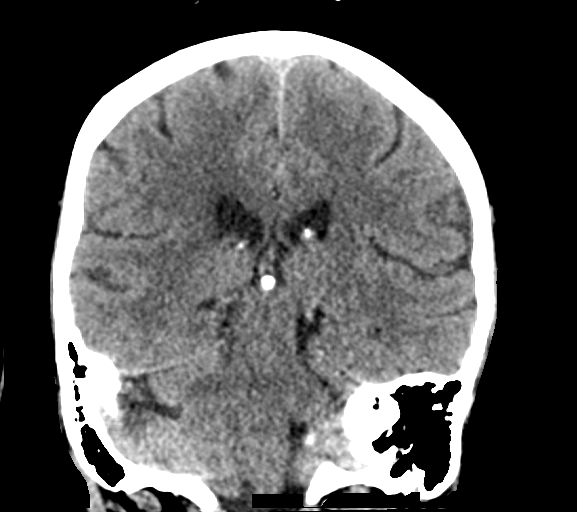

[Series 5: head without sag · sagittal · non-contrast · 0.30mm/px · 3 of 67 slices shown]
[im 23/67  brain]
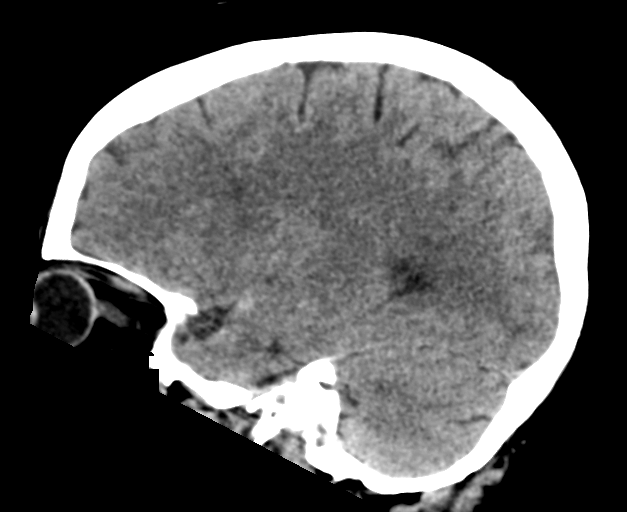
[im 34/67  brain]
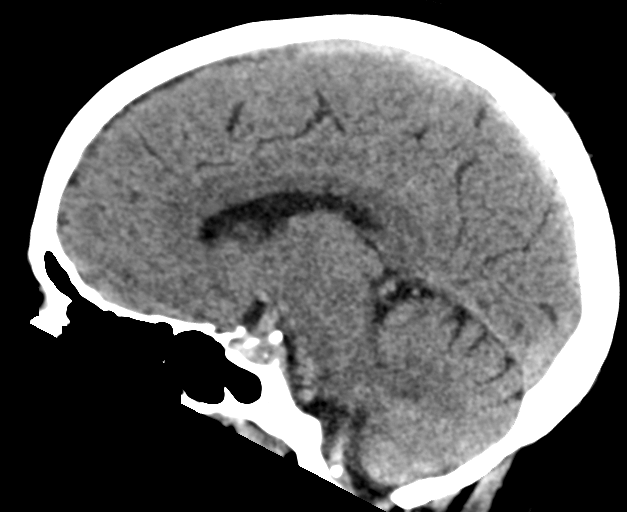
[im 45/67  brain]
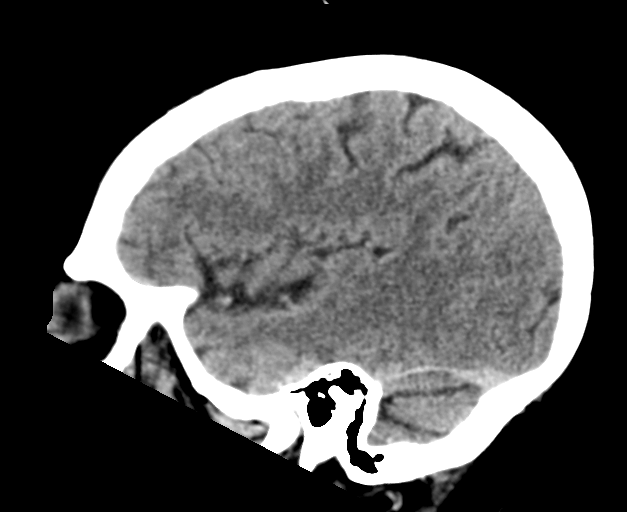

[17 of 47 positions shown; findings below may reference images not displayed]

FINDINGS: Brain: No evidence of acute infarction, hemorrhage, hydrocephalus,
extra-axial collection or mass lesion/mass effect.

Vascular: No hyperdense vessel or unexpected calcification.

Skull: Normal. Negative for fracture or focal lesion.

Sinuses/Orbits: No acute finding.

Other: None.
IMPRESSION: Normal head CT.

## 2019-05-25 IMAGING — MR MR HEAD W/O CM
13 of 14 series · 44 of 48 positions shown · non-contrast
Comparison: [DATE] brain MRI

CLINICAL DATA: Left-sided facial tingling

EXAM:
MRI HEAD WITHOUT CONTRAST
TECHNIQUE: Multiplanar, multiecho pulse sequences of the brain and surrounding
structures were obtained without intravenous contrast.

[Series 5: DWI · axial · 3.0mm · 0.88mm/px · z∈[-140,-1]mm · 6 of 104 slices shown (1 of 4)]
[im 1/104]
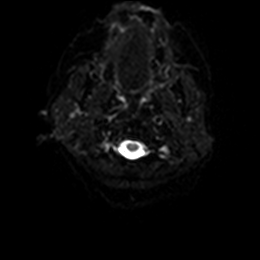
[im 21/104]
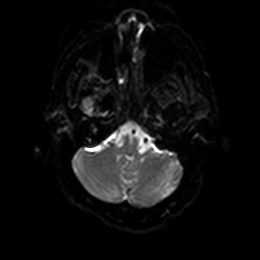
[im 42/104]
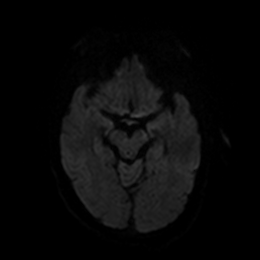
[im 62/104]
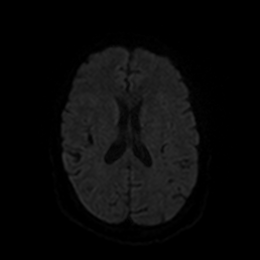
[im 83/104]
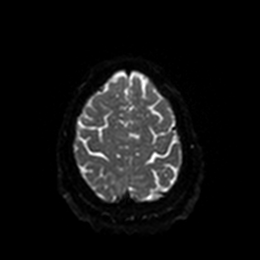
[im 104/104]
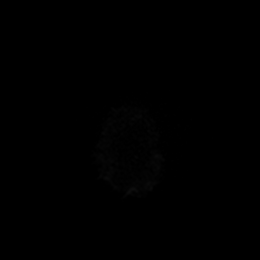

[Series 6: DWI · axial · 3.0mm · 0.88mm/px · z∈[-140,-1]mm · 4 of 52 slices shown (2 of 4)]
[im 1/52]
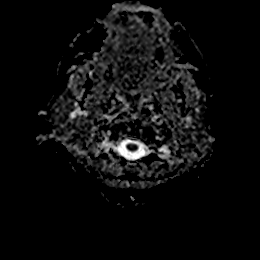
[im 18/52]
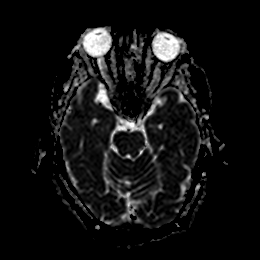
[im 35/52]
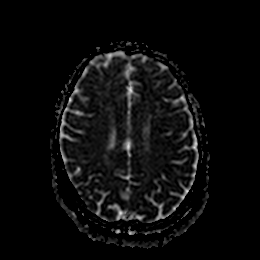
[im 52/52]
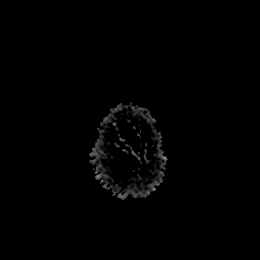

[Series 7: DWI · coronal · 4.0mm · 0.88mm/px · 5 of 72 slices shown (3 of 4)]
[im 1/72]
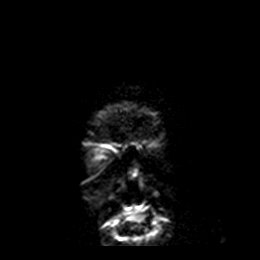
[im 18/72]
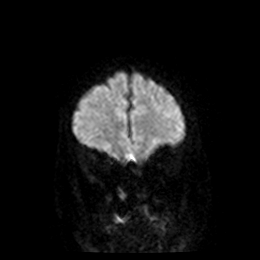
[im 36/72]
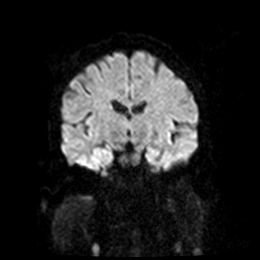
[im 54/72]
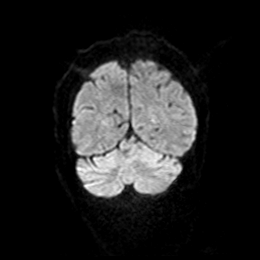
[im 72/72]
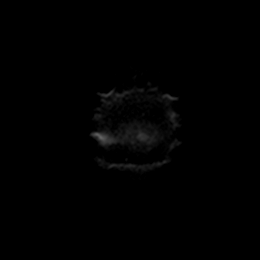

[Series 8: DWI · coronal · 4.0mm · 0.88mm/px · 3 of 36 slices shown (4 of 4)]
[im 1/36]
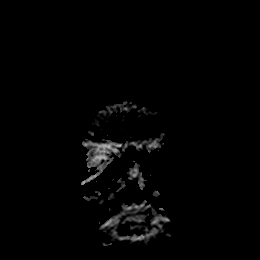
[im 18/36]
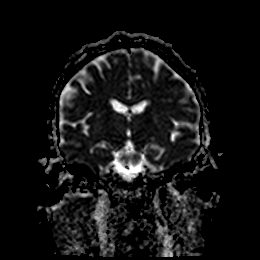
[im 36/36]
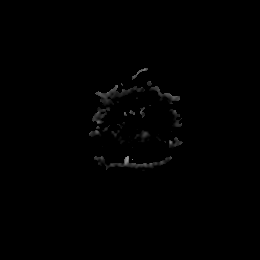

[Series 9: T1 · sagittal · 5.0mm · 0.75mm/px · 2 of 23 slices shown]
[im 1/23]
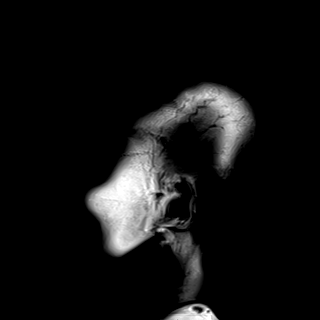
[im 23/23]
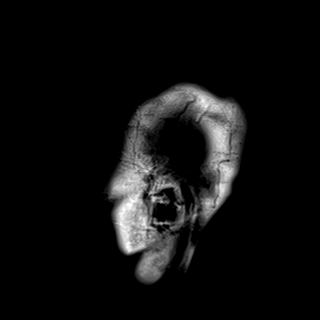

[Series 10: T2 · axial · 5.0mm · 0.72mm/px · z∈[-145,-15]mm · 2 of 25 slices shown]
[im 1/25]
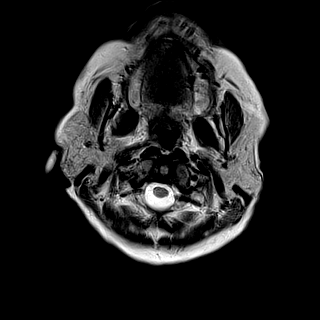
[im 25/25]
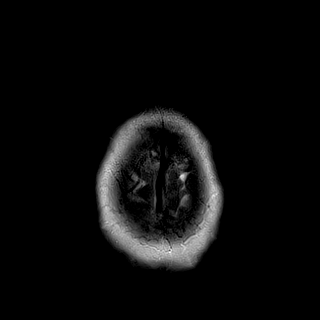

[Series 11: FLAIR · axial · 5.0mm · 0.90mm/px · z∈[-145,-15]mm · 2 of 25 slices shown (1 of 2)]
[im 1/25]
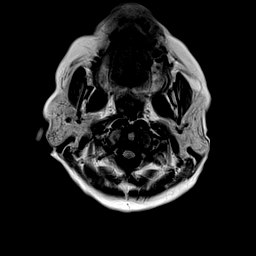
[im 25/25]
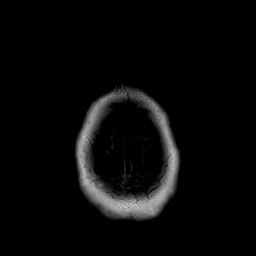

[Series 12: mag_images · axial · 3.0mm · 0.90mm/px · z∈[-151,+9]mm · 4 of 60 slices shown]
[im 1/60]
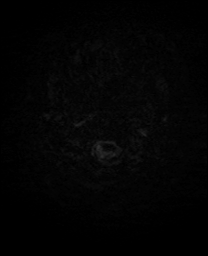
[im 20/60]
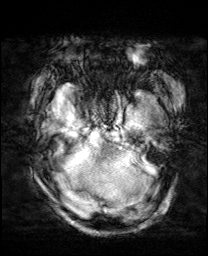
[im 40/60]
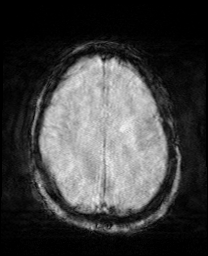
[im 60/60]
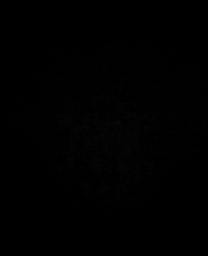

[Series 13: pha_images · axial · 3.0mm · 0.90mm/px · z∈[-146,+1]mm · 4 of 54 slices shown]
[im 1/54]
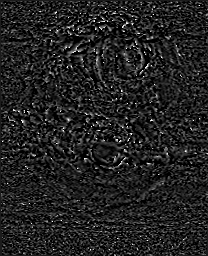
[im 18/54]
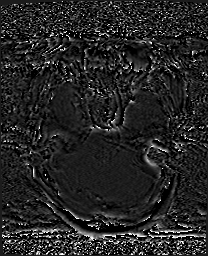
[im 36/54]
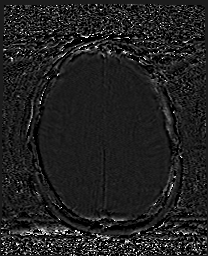
[im 54/54]
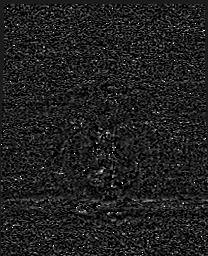

[Series 14: swi_images · axial · 3.0mm · 0.90mm/px · z∈[-151,+9]mm · 4 of 60 slices shown]
[im 1/60]
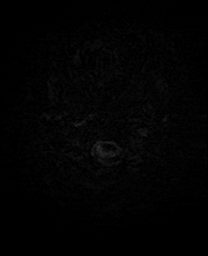
[im 20/60]
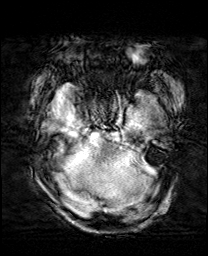
[im 40/60]
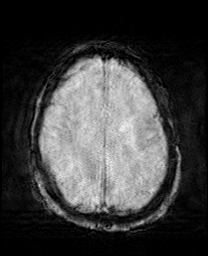
[im 60/60]
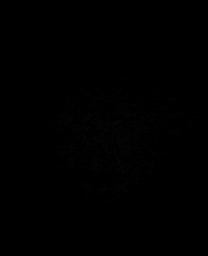

[Series 15: mip_images(sw) · axial · 24.0mm · 0.90mm/px · z∈[-142,-0]mm · 4 of 53 slices shown]
[im 1/53]
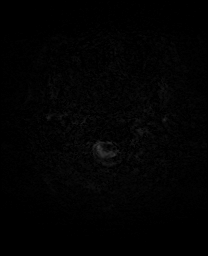
[im 18/53]
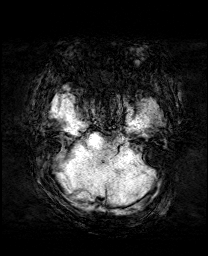
[im 35/53]
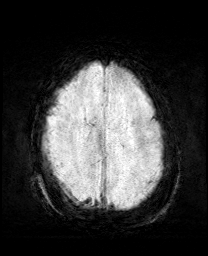
[im 53/53]
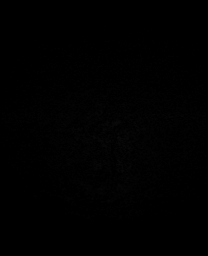

[Series 17: ax hemo · axial · 5.0mm · 0.86mm/px · z∈[-134,-4]mm · 2 of 25 slices shown]
[im 1/25]
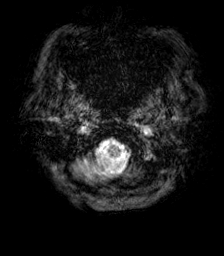
[im 25/25]
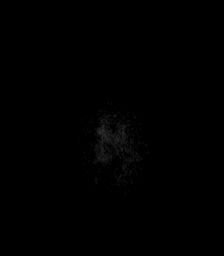

[Series 18: FLAIR · axial · 5.0mm · 0.90mm/px · z∈[-135,-5]mm · 2 of 25 slices shown (2 of 2)]
[im 1/25]
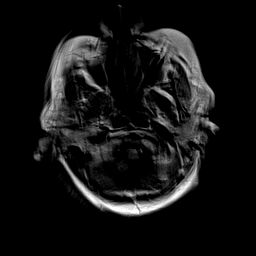
[im 25/25]
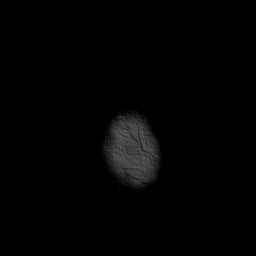

[44 of 48 positions shown; findings below may reference images not displayed]

FINDINGS: Brain: Resolving DWI abnormality at the posterior aspect of the left
lentiform nucleus. No acute ischemia. Multifocal white matter
hyperintensity, most commonly due to chronic ischemic
microangiopathy. Normal volume of CSF spaces. No chronic
microhemorrhage. Normal midline structures.

Vascular: Normal flow voids.

Skull and upper cervical spine: Normal marrow signal.

Sinuses/Orbits: Negative.

Other: None.
IMPRESSION: 1. No acute intracranial abnormality.
2. Resolving DWI abnormality at the posterior aspect of the left
lentiform nucleus.
3. Findings of mild chronic ischemic microangiopathy.

## 2019-05-25 MED ORDER — AMLODIPINE BESYLATE 5 MG PO TABS
5.0000 mg | ORAL_TABLET | Freq: Once | ORAL | Status: AC
  Administered 2019-05-25: 21:00:00 5 mg via ORAL
  Filled 2019-05-25: qty 1

## 2019-05-25 MED ORDER — SODIUM CHLORIDE 0.9% FLUSH
3.0000 mL | Freq: Once | INTRAVENOUS | Status: AC
  Administered 2019-05-25: 21:00:00 3 mL via INTRAVENOUS

## 2019-05-25 MED ORDER — LORAZEPAM 1 MG PO TABS
1.0000 mg | ORAL_TABLET | Freq: Once | ORAL | Status: AC
  Administered 2019-05-25: 1 mg via ORAL
  Filled 2019-05-25: qty 1

## 2019-05-25 MED ORDER — LORAZEPAM 2 MG/ML IJ SOLN
1.0000 mg | Freq: Once | INTRAMUSCULAR | Status: AC
  Administered 2019-05-25: 22:00:00 1 mg via INTRAVENOUS
  Filled 2019-05-25: qty 1

## 2019-05-25 NOTE — ED Notes (Addendum)
Per MRI, pt was able to complete most of scan. EDP made aware.

## 2019-05-25 NOTE — Discharge Instructions (Addendum)
You have been diagnosed today with Paresthesia and Elevated Blood pressure reading.  At this time there does not appear to be the presence of an emergent medical condition, however there is always the potential for conditions to change. Please read and follow the below instructions.  Please return to the Emergency Department immediately for any new or worsening symptoms. Please be sure to follow up with your Primary Care Provider within one week regarding your visit today; please call their office to schedule an appointment even if you are feeling better for a follow-up visit. Please take all of your home medications as prescribed by your neurologist and primary care provider.  Noncompliance with your regular medications can increase your risk of stroke and other future health problems. Please call your neurologist first thing tomorrow morning to schedule a follow-up appointment for reevaluation and discussion of medication management. Please stop using cocaine as this greatly increases your risk of stroke and other health problems.  Get help right away if you: Feel weak. Have trouble walking or moving. Have problems speaking, understanding, or seeing. Feel confused. Cannot control when you pee (urinate) or poop (have a bowel movement). Lose feeling (have numbness) after an injury. Have new weakness in an arm or leg. Pass out (faint). Get a very bad headache. Start to feel mixed up (confused). Feel weak or numb. Feel faint. Have very bad pain in your: Chest. Belly (abdomen). Throw up more than once. Have trouble breathing. You have any new/concerning or worsening of symptoms Please read the additional information packets attached to your discharge summary.  Do not take your medicine if  develop an itchy rash, swelling in your mouth or lips, or difficulty breathing; call 911 and seek immediate emergency medical attention if this occurs.  Note: Portions of this text may have been  transcribed using voice recognition software. Every effort was made to ensure accuracy; however, inadvertent computerized transcription errors may still be present.

## 2019-05-25 NOTE — ED Triage Notes (Signed)
Pt here for tingling in L side of face and tongue x 3 days. Sts she had a mini stroke recently and is stumbling around like she did at the time of that stroke.

## 2019-05-25 NOTE — ED Provider Notes (Signed)
[facility] EMERGENCY DEPARTMENT Provider Note   CSN: [id number] Arrival date & time: 05/25/19  1402     History Chief Complaint  Patient presents with  . Numbness    Katrina Schmidt is a 59 y.o. female with history of CVA, alcohol abuse, tobacco abuse, hypertension, hyperlipidemia, chronic bronchitis, sciatica.  Patient presents today for left-sided facial tingling/numbness, tongue tingling and imbalance that began 3 days ago.  She reports that on Friday, 05/22/2019 immediately after she woke up she felt a tingling sensation to the lower left half of her face involving the tongue as well.  She initially thought this would go away but reports symptoms continually worsened throughout the day.  She feels that her mouth is now dry and she is having some trouble with speaking.  Around midday on Friday she reports that she began stumbling around her home, she denies any fall but felt that she was having difficulty walking and keeping her balance.  Patient reports that she had a stroke in October 2020 at which time her right upper extremity was affected and she had balance issues.  She reports that she became concerned today as her balance issues have continued to worsen and she felt this was very similar to her previous CVA.  Of note patient reports that she is only taking her lisinopril/HCTZ at this time along with hydrocodone.  She reports intermittent aspirin use but reports that she does not take any of her other medications as she feels that it is "too many medications".  She denies any fall/injury, headache, vision changes, facial asymmetry, neck pain, chest pain/shortness of breath, abdominal pain, nausea/vomiting, diarrhea, dysuria/hematuria, extremity weakness/numbness or any additional concerns. HPI     Past Medical History:  Diagnosis Date  . Anxiety   . Chronic bronchitis (HCC)   . DDD (degenerative disc disease), lumbosacral   . DJD (degenerative joint disease)    . Hyperlipidemia   . Hypertension   . Sciatica     Patient Active Problem List   Diagnosis Date Noted  . CVA (cerebral vascular accident) (HCC) 01/16/2019  . Hypertension 01/16/2019  . Hyperlipidemia 01/16/2019  . Tobacco abuse 01/16/2019  . Alcohol abuse 01/16/2019  . Sciatica 01/16/2019  . Panic attacks 01/16/2019    Past Surgical History:  Procedure Laterality Date  . HERNIA REPAIR       OB History   No obstetric history on file.     Family History  Problem Relation Age of Onset  . Hypertension Mother     Social History   Tobacco Use  . Smoking status: Current Every Day Smoker    Packs/day: 0.50    Years: 40.00    Pack years: 20.00    Types: Cigarettes  . Smokeless tobacco: Never Used  Substance Use Topics  . Alcohol use: Yes    Comment: Drinks 60oz beer daily  . Drug use: No    Home Medications Prior to Admission medications   Medication Sig Start Date End Date Taking? Authorizing Provider  acetaminophen (TYLENOL) 500 MG tablet Take 1,000-2,000 mg by mouth 2 (two) times daily as needed (pain).   Yes [provider]  albuterol (VENTOLIN HFA) 108 (90 Base) MCG/ACT inhaler Inhale 2 puffs into the lungs every 6 (six) hours as needed for wheezing or shortness of breath.    [provider]  ALPRAZolam Prudy Feeler) 0.5 MG tablet Take 1 mg by mouth 2 (two) times daily as needed (panic attacks).  12/27/18   [provider]  amLODipine (NORVASC) 10 MG tablet Take 1 tablet (10 mg total) by mouth every morning. 01/20/19   Narda Bonds, MD  aspirin 325 MG tablet Take 81 mg by mouth daily.    [provider]  atorvastatin (LIPITOR) 40 MG tablet Take 1 tablet (40 mg total) by mouth daily at 6 PM. 01/18/19   Narda Bonds, MD  CALCIUM PO Take 1 tablet by mouth daily.    [provider]  Carboxymethylcellulose Sodium (ARTIFICIAL TEARS OP) Place 1 drop into both eyes 2 (two) times daily.    [provider]    Cholecalciferol (VITAMIN D3 PO) Take 1 tablet by mouth daily.    [provider]  CRANBERRY PO Take 1 tablet by mouth daily.    [provider]  Cyanocobalamin (VITAMIN B-12 PO) Take 1 tablet by mouth daily.    [provider]  diphenhydrAMINE (BENADRYL) 25 MG tablet Take 50 mg by mouth 2 (two) times daily as needed for allergies.    [provider]  hydrochlorothiazide (HYDRODIURIL) 25 MG tablet Take 1 tablet (25 mg total) by mouth every morning. 01/20/19   Narda Bonds, MD  HYDROcodone-acetaminophen (NORCO) 5-325 MG tablet Take 1 tablet by mouth every 6 (six) hours as needed for severe pain. 09/25/15   Street, Mercedes, PA-C  lidocaine (LIDODERM) 5 % Place 1 patch onto the skin daily. Remove & Discard patch within 12 hours or as directed by MD 01/10/18   Couture, Cortni S, PA-C  lisinopril (ZESTRIL) 40 MG tablet Take 1 tablet (40 mg total) by mouth every morning. 01/20/19   Narda Bonds, MD  Multiple Vitamin (MULTIVITAMIN WITH MINERALS) TABS tablet Take 1 tablet by mouth daily.    [provider]  simvastatin (ZOCOR) 10 MG tablet Take 10 mg by mouth daily.    [provider]  triamcinolone cream (KENALOG) 0.1 % Apply 1 application topically 2 (two) times daily. 09/23/16   Fawze, Mina A, PA-C  VITAMIN E PO Take 1 capsule by mouth daily.    [provider]    Allergies    Apple, Citrus, Other, Peach [prunus persica], Pineapple, Plum pulp, and Strawberry extract  Review of Systems   Review of Systems Ten systems are reviewed and are negative for acute change except as noted in the HPI  Physical Exam Updated Vital Signs BP (!) 178/108   Pulse 80   Temp 97.9 F (36.6 C) (Oral)   Resp 18   SpO2 97%   Physical Exam Constitutional:      General: She is not in acute distress.    Appearance: Normal appearance. She is well-developed. She is not ill-appearing or diaphoretic.  HENT:     Head: Normocephalic and atraumatic.      Jaw: There is normal jaw occlusion.     Right Ear: Tympanic membrane and external ear normal.     Left Ear: Tympanic membrane and external ear normal.     Nose: Nose normal.     Mouth/Throat:     Mouth: Mucous membranes are moist.     Pharynx: Oropharynx is clear.  Eyes:     General: Vision grossly intact. Gaze aligned appropriately.     Pupils: Pupils are equal, round, and reactive to light.  Neck:     Trachea: Trachea and phonation normal. No tracheal deviation.  Pulmonary:     Effort: Pulmonary effort is normal. No respiratory distress.  Abdominal:     General: There is  no distension.     Palpations: Abdomen is soft.     Tenderness: There is no abdominal tenderness. There is no guarding or rebound.  Musculoskeletal:        General: Normal range of motion.     Cervical back: Normal range of motion.  Skin:    General: Skin is warm and dry.  Neurological:     Mental Status: She is alert.     GCS: GCS eye subscore is 4. GCS verbal subscore is 5. GCS motor subscore is 6.     Comments: Mental Status: Alert, oriented, thought content appropriate, able to give a coherent history. Speech fluent without evidence of aphasia. Able to follow 2 step commands without difficulty. Cranial Nerves: II: Peripheral visual fields grossly normal, pupils equal, round, reactive to light III,IV, VI: ptosis not present, extra-ocular motions intact bilaterally V,VII: smile symmetric, eyebrows raise symmetric, patient reports decreased sensation to entire left face in all distributions.   VIII: hearing grossly normal to voice X: uvula elevates symmetrically XI: bilateral shoulder shrug symmetric and strong XII: midline tongue extension without fassiculations Motor: Normal tone. 5/5 strength in upper and lower extremities bilaterally including strong and equal grip strength and dorsiflexion/plantar flexion Sensory: Sensation intact to light touch in all extremities.Negative Romberg.  Deep Tendon  Reflexes: 2+ symmetric patella Cerebellar: normal finger-to-nose with bilateral upper extremities. Normal heel-to -shin balance bilaterally of the lower extremity. No pronator drift.  Gait: normal gait and balance CV: distal pulses palpable throughout  Psychiatric:        Behavior: Behavior normal.    ED Results / Procedures / Treatments   Labs (all labs ordered are listed, but only abnormal results are displayed) Labs Reviewed  CBC - Abnormal; Notable for the following components:      Result Value   Hemoglobin 15.8 (*)    HCT 48.2 (*)    All other components within normal limits  COMPREHENSIVE METABOLIC PANEL - Abnormal; Notable for the following components:   Glucose, Bld 115 (*)    Total Protein 6.4 (*)    All other components within normal limits  RAPID URINE DRUG SCREEN, HOSP PERFORMED - Abnormal; Notable for the following components:   Cocaine POSITIVE (*)    All other components within normal limits  URINALYSIS, ROUTINE W REFLEX MICROSCOPIC - Abnormal; Notable for the following components:   Hgb urine dipstick SMALL (*)    Bacteria, UA RARE (*)    All other components within normal limits  I-STAT CHEM 8, ED - Abnormal; Notable for the following components:   Glucose, Bld 109 (*)    Calcium, Ion 1.03 (*)    Hemoglobin 16.0 (*)    HCT 47.0 (*)    All other components within normal limits  CBG MONITORING, ED - Abnormal; Notable for the following components:   Glucose-Capillary 132 (*)    All other components within normal limits  PROTIME-INR  APTT  DIFFERENTIAL  ETHANOL  I-STAT BETA HCG BLOOD, ED ([facility], [facility], [facility] ONLY)    EKG EKG Interpretation  Date/Time:  Monday May 25 2019 14:07:38 EST Ventricular Rate:  81 PR Interval:  162 QRS Duration: 74 QT Interval:  386 QTC Calculation: 448 R Axis:   40 Text Interpretation: Normal sinus rhythm Left ventricular hypertrophy with repolarization abnormality ( Sokolow-Lyon , Romhilt-Estes ) Abnormal ECG since last  tracing no significant change Confirmed by Mancel Bale [phone]) on 05/25/2019 4:09:58 PM   Radiology CT HEAD WO CONTRAST  Result Date: 05/25/2019 CLINICAL  DATA:  Left facial tingling. EXAM: CT HEAD WITHOUT CONTRAST TECHNIQUE: Contiguous axial images were obtained from the base of the skull through the vertex without intravenous contrast. COMPARISON:  January 16, 2019. FINDINGS: Brain: No evidence of acute infarction, hemorrhage, hydrocephalus, extra-axial collection or mass lesion/mass effect. Vascular: No hyperdense vessel or unexpected calcification. Skull: Normal. Negative for fracture or focal lesion. Sinuses/Orbits: No acute finding. Other: None. IMPRESSION: Normal head CT. Electronically Signed   By: Marijo Conception M.D.   On: 05/25/2019 14:36   MR BRAIN WO CONTRAST  Result Date: 05/25/2019 CLINICAL DATA:  Left-sided facial tingling EXAM: MRI HEAD WITHOUT CONTRAST TECHNIQUE: Multiplanar, multiecho pulse sequences of the brain and surrounding structures were obtained without intravenous contrast. COMPARISON:  01/16/2019 brain MRI FINDINGS: Brain: Resolving DWI abnormality at the posterior aspect of the left lentiform nucleus. No acute ischemia. Multifocal white matter hyperintensity, most commonly due to chronic ischemic microangiopathy. Normal volume of CSF spaces. No chronic microhemorrhage. Normal midline structures. Vascular: Normal flow voids. Skull and upper cervical spine: Normal marrow signal. Sinuses/Orbits: Negative. Other: None. IMPRESSION: 1. No acute intracranial abnormality. 2. Resolving DWI abnormality at the posterior aspect of the left lentiform nucleus. 3. Findings of mild chronic ischemic microangiopathy. Electronically Signed   By: Ulyses Jarred M.D.   On: 05/25/2019 23:03    Procedures Procedures (including critical care time)  Medications Ordered in ED Medications  sodium chloride flush (NS) 0.9 % injection 3 mL (3 mLs Intravenous Given 05/25/19 2050)  LORazepam (ATIVAN)  tablet 1 mg (1 mg Oral Given 05/25/19 1728)  LORazepam (ATIVAN) injection 1 mg (1 mg Intravenous Given 05/25/19 2211)  amLODipine (NORVASC) tablet 5 mg (5 mg Oral Given 05/25/19 2125)    ED Course  I have reviewed the triage vital signs and the nursing notes.  Pertinent labs & imaging results that were available during my care of the patient were reviewed by me and considered in my medical decision making (see chart for details).    MDM Rules/Calculators/A&P                     59 year old female presented today for concern of left facial tingling, numbness of the tongue and balance issues ongoing for 3 days.  She is noncompliant with home medications given to her after previous CVA in October 2020.  On examination she is well-appearing and in no acute distress, endorses decreased sensation entire left face, otherwise normal neurologic exam.  No evidence of Bell's palsy at this time.  Additionally ear canal is clear no evidence of Ramsay Hunt syndrome.    CBG 132 Urinalysis does not appear infected Beta-hCG negative PT/INR within normal limits APTT within normal limits CBC nonacute CMP nonacute CT head:    IMPRESSION:  Normal head CT.   - Triage lab work and CT head is unremarkable.  Based on patient's noncompliance and history considered patient may have had another CVA today.  Will obtain MRI brain.  Patient is agreeable to plan of care.  She is questing Ativan before MRI which will be given p.o. as she is refusing IV. - Patient was transported to MRI, as she could not tolerate MRI due to anxiety reporting the MRI machine was too small. The larger MRI machine is currently out of commission.  She is very upset in the room and yelling.  She is requesting food and drink to calm down prior to reattempting MRI. - Patient was given food and drink, is much more calm.  She is now agreeable to IV to be placed into reattempt MRI.  1 mg IV Ativan has been ordered for patient given prior to transport  over to radiology.  UDS positive for cocaine Ethanol negative - MRI Brain:  IMPRESSION:  1. No acute intracranial abnormality.  2. Resolving DWI abnormality at the posterior aspect of the left  lentiform nucleus.  3. Findings of mild chronic ischemic microangiopathy.  - 11:25 PM: Patient reassessed sleeping easily arousable to voice.  She reports that she is feeling well and has no complaints.  She has been updated on results as above and states understanding.  I had a long discussion with patient about her medication compliance, she had multiple abstract reasons not to take her medications but reports that she does have them at home and can take them if needed.  I strongly encouraged her to remain compliant with all medications and discussed with her the increased risk for future stroke with noncompliance.  She reports that she plans to call [facility] neurology tomorrow morning to discuss her daily medications and to to schedule follow-up regarding her visit today.  Additionally I addressed patient's UDS positive for cocaine today, she reports that she is currently "weaning herself off of cocaine".  I have strongly advised her to stop using cocaine and informed her of the increased risk of stroke with its use.  She stated understanding and will attempt to stop using cocaine, will give her resource guide for substance abuse.  She has been ambulating around the emergency department is eating and drinking without assistance or difficulty.  Well-appearing and in no acute distress.  At this time there does not appear to be any evidence of an acute emergency medical condition and the patient appears stable for discharge with appropriate outpatient follow up. Diagnosis was discussed with patient who verbalizes understanding of care plan and is agreeable to discharge. I have discussed return precautions with patient who verbalizes understanding of return precautions. Patient encouraged to follow-up with their  PCP and neurology. All questions answered.  Patient has been discharged in good condition.  Patient's case discussed with Dr. Effie Shy who agrees with plan to discharge with neurology follow-up.   Note: Portions of this report may have been transcribed using voice recognition software. Every effort was made to ensure accuracy; however, inadvertent computerized transcription errors may still be present. Final Clinical Impression(s) / ED Diagnoses Final diagnoses:  Paresthesia  Elevated blood pressure reading    Rx / DC Orders ED Discharge Orders    None       Elizabeth Palau 05/25/19 2339    Mancel Bale, MD 05/28/19 [phone]

## 2019-05-25 NOTE — ED Notes (Signed)
Assumed care of pt. Pt alert, resting on cart in NAD. Pt speaking in full sentences. Breathing easy, non-labored. Call light within reach. Will continue to monitor

## 2019-05-25 NOTE — ED Notes (Signed)
Pt provided with Beverage and crackers per EDP

## 2019-05-25 NOTE — ED Notes (Addendum)
Pt currently refusing to have IV placed and refusing Blood Draws. EDP made aware. No further orders at this time.

## 2019-05-25 NOTE — ED Notes (Signed)
Pt Provided with Sandwich per EDP.

## 2019-05-25 NOTE — ED Notes (Signed)
Pt transported to MRI 

## 2019-05-25 NOTE — Progress Notes (Signed)
Attempted MRI, pt extremely claustrophobic. Refused MRi

## 2019-05-25 NOTE — ED Notes (Signed)
This RN called MRI to check placement; this RN was informed that MRI would call ahead prior to IV Ativan Admin.

## 2019-05-25 NOTE — ED Notes (Signed)
This RN attempted to insert a 22 G IV but pt refused and requested a 24G IV; this RN educated this pt about increased for infiltration with 24G. Pt still refused and a 24G was inserted.

## 2019-05-25 NOTE — ED Notes (Addendum)
Patient verbalizes understanding of discharge instructions. Confirmed with EDP that pt can be discharged despite high blood pressure. Per EDP, ok for discharge. Opportunity for questioning and answers were provided.  All questions answered completely. PIV removed, catheter intact. Site dressed with gauze and tape. Armband removed by staff, pt discharged from ED. Ambulatory from ED with all belongings and picked up by daughter.

## 2019-05-25 NOTE — ED Notes (Signed)
EDP made aware that pt was not able to complete MRI due to anxiety.

## 2019-07-07 ENCOUNTER — Ambulatory Visit: Payer: Medicaid Other | Admitting: Adult Health

## 2019-07-07 ENCOUNTER — Encounter: Payer: Self-pay | Admitting: Adult Health

## 2019-07-09 ENCOUNTER — Telehealth: Payer: Self-pay

## 2019-07-09 NOTE — Telephone Encounter (Signed)
Patient no show for appt on 07/07/2019. 

## 2019-07-14 ENCOUNTER — Ambulatory Visit: Payer: Medicaid Other | Admitting: Adult Health

## 2019-07-16 ENCOUNTER — Ambulatory Visit: Payer: Medicaid Other | Admitting: Adult Health

## 2019-08-11 ENCOUNTER — Encounter: Payer: Self-pay | Admitting: Podiatry

## 2019-08-11 ENCOUNTER — Other Ambulatory Visit: Payer: Self-pay

## 2019-08-11 ENCOUNTER — Ambulatory Visit (INDEPENDENT_AMBULATORY_CARE_PROVIDER_SITE_OTHER): Payer: Medicaid Other | Admitting: Podiatry

## 2019-08-11 DIAGNOSIS — B351 Tinea unguium: Secondary | ICD-10-CM

## 2019-08-11 DIAGNOSIS — M79674 Pain in right toe(s): Secondary | ICD-10-CM | POA: Diagnosis not present

## 2019-08-11 DIAGNOSIS — M79675 Pain in left toe(s): Secondary | ICD-10-CM | POA: Diagnosis not present

## 2019-08-11 NOTE — Progress Notes (Signed)
Complaint:  Visit Type: Patient returns to my office for continued preventative foot care services. Complaint: Patient states" my nails have grown long and thick and become painful to walk and wear shoes" . The patient presents for preventative foot care services.Patient says she has been unable to trim her nails herself.  Podiatric Exam: Vascular: dorsalis pedis and posterior tibial pulses are palpable bilateral. Capillary return is immediate. Temperature gradient is WNL. Skin turgor WNL  Sensorium: Normal Semmes Weinstein monofilament test. Normal tactile sensation bilaterally. Nail Exam: Pt has thick disfigured discolored nails with subungual debris noted bilateral entire nail hallux through fifth toenails Ulcer Exam: There is no evidence of ulcer or pre-ulcerative changes or infection. Orthopedic Exam: Muscle tone and strength are WNL. No limitations in general ROM. No crepitus or effusions noted. Foot type and digits show no abnormalities. Bony prominences are unremarkable. Skin: No Porokeratosis. No infection or ulcers  Diagnosis:  Onychomycosis, , Pain in right toe, pain in left toes  Treatment & Plan Procedures and Treatment: Consent by patient was obtained for treatment procedures.   Debridement of mycotic and hypertrophic toenails, 1 through 5 bilateral and clearing of subungual debris. No ulceration, no infection noted.  Return Visit-Office Procedure: Patient instructed to return to the office for a follow up visit 3 months for continued evaluation and treatment.    Helane Gunther DPM

## 2019-09-17 ENCOUNTER — Ambulatory Visit: Payer: Medicaid Other | Admitting: Adult Health

## 2019-11-10 ENCOUNTER — Ambulatory Visit: Payer: Medicaid Other | Admitting: Podiatry

## 2019-12-01 ENCOUNTER — Ambulatory Visit: Payer: Medicaid Other | Admitting: Podiatry

## 2019-12-08 ENCOUNTER — Ambulatory Visit: Payer: Medicaid Other | Admitting: Adult Health

## 2019-12-08 NOTE — Progress Notes (Unsigned)
[facility] [address] [place]. [place] [address] [phone]       HOSPITAL FOLLOW UP NOTE  [name] Date of Birth:  1960/12/10 Medical Record Number:  [id number]   Reason for Referral:  hospital stroke follow up    CHIEF COMPLAINT:  No chief complaint on file.   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  History obtained from patient and chart review. Reviewed all radiology images and labs personally.  [name] is a 59 y.o. female with history of HTN, HLD, sciatica and tobacco use  presented on 2020 with balance issues, weakness, dizziness s/p fall today.   Stroke work-up showed small acute infarct posterior limb internal capsule on the left as evidenced on MRI secondary to small vessel disease.  She did not receive IV t-PA due to late presentation (>4.5 hours from time of onset).   CTA head/neck unremarkable.  2D echo unremarkable.  LDL 52.  A1c 5.5.  UDS positive for cocaine.  Recommended DAPT for 3 weeks and aspirin alone.  HTN stable and recommended long-term BP goal normotensive range.  Initiated atorvastatin 40 mg daily.  No evidence or history of DM.  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  Cocaine cessation counseling provided.  Tobacco abuse with smoking cessation counseling provided.  Other stroke risk factors include EtOH use but no prior history of stroke.  Residual deficits of dysarthria, right facial weakness and right hemiparesis and discharged home with recommendation of outpatient therapies.  Katrina Schmidt Is a 59 year old female being seen today for hospital follow up. Residual deficits right sided weakness and imbalance.  She continues to participate in outpatient physical therapy with ongoing improvement.  She does have  history of lower back pain with left-sided sciatica and radiculopathy.  She reports lower back pain slowly worsening and has been interfering with improvement of balance.  She continues to use Rollator walker for ambulation.  She has not previously been evaluated by orthopedics or neurosurgery for lower back pain and is currently on hydrocodone-acetaminophen without great benefit.  She has trialed gabapentin in the past without benefit.  She has completed 3 weeks DAPT and continues on aspirin alone without bleeding or bruising.  Continues on atorvastatin without myalgias.  Blood pressure today elevated at 152/108.  She does not routinely monitor at home but does plan obtaining blood pressure cuff in order to start monitoring.  She does endorse slowly decreasing tobacco usage but also largely depends on the day and stress level.  She continues to consume approximately 60 ounces of beer at least 5 days/week.  She denies any additional substance or recreational drug abuse.  No further concerns at this time.     ROS:   14 system review of systems performed and negative with exception of pain, weakness, and balance  PMH:  Past Medical History:  Diagnosis Date  . Anxiety   . Chronic bronchitis (HCC)   . DDD (degenerative disc disease), lumbosacral   . DJD (degenerative joint disease)   . Hyperlipidemia   . Hypertension   . Sciatica     PSH:  Past Surgical History:  Procedure Laterality Date  . HERNIA REPAIR      Social History:  Social History   Socioeconomic History  . Marital status: Single  Spouse name: Not on file  . Number of children: Not on file  . Years of education: Not on file  . Highest education level: Not on file  Occupational History  . Not on file  Tobacco Use  . Smoking status: Current Every Day Smoker    Packs/day: 0.50    Years: 40.00    Pack years: 20.00    Types: Cigarettes  . Smokeless tobacco: Never Used  Vaping Use  . Vaping Use: Never used  Substance  and Sexual Activity  . Alcohol use: Yes    Comment: Drinks 60oz beer daily  . Drug use: No  . Sexual activity: Never  Other Topics Concern  . Not on file  Social History Narrative  . Not on file   Social Determinants of Health   Financial Resource Strain:   . Difficulty of Paying Living Expenses: Not on file  Food Insecurity:   . Worried About Programme researcher, broadcasting/film/videounning Out of Food in the Last Year: Not on file  . Ran Out of Food in the Last Year: Not on file  Transportation Needs:   . Lack of Transportation (Medical): Not on file  . Lack of Transportation (Non-Medical): Not on file  Physical Activity:   . Days of Exercise per Week: Not on file  . Minutes of Exercise per Session: Not on file  Stress:   . Feeling of Stress : Not on file  Social Connections:   . Frequency of Communication with Friends and Family: Not on file  . Frequency of Social Gatherings with Friends and Family: Not on file  . Attends Religious Services: Not on file  . Active Member of Clubs or Organizations: Not on file  . Attends BankerClub or Organization Meetings: Not on file  . Marital Status: Not on file  Intimate Partner Violence:   . Fear of Current or Ex-Partner: Not on file  . Emotionally Abused: Not on file  . Physically Abused: Not on file  . Sexually Abused: Not on file    Family History:  Family History  Problem Relation Age of Onset  . Hypertension Mother     Medications:   Current Outpatient Medications on File Prior to Visit  Medication Sig Dispense Refill  . acetaminophen (TYLENOL) 500 MG tablet Take 1,000-2,000 mg by mouth 2 (two) times daily as needed (pain).    Marland Kitchen. albuterol (VENTOLIN HFA) 108 (90 Base) MCG/ACT inhaler Inhale 2 puffs into the lungs every 6 (six) hours as needed for wheezing or shortness of breath.    . ALPRAZolam (XANAX) 0.5 MG tablet Take 1 mg by mouth 2 (two) times daily as needed (panic attacks).     Marland Kitchen. amLODipine (NORVASC) 10 MG tablet Take 1 tablet (10 mg total) by mouth every  morning.    Marland Kitchen. aspirin 325 MG tablet Take 81 mg by mouth daily.    Marland Kitchen. CALCIUM PO Take 1 tablet by mouth daily.    . Carboxymethylcellulose Sodium (ARTIFICIAL TEARS OP) Place 1 drop into both eyes 2 (two) times daily.    . Cholecalciferol (VITAMIN D3 PO) Take 1 tablet by mouth daily.    Marland Kitchen. CRANBERRY PO Take 1 tablet by mouth daily.    . Cyanocobalamin (VITAMIN B-12 PO) Take 1 tablet by mouth daily.    . diphenhydrAMINE (BENADRYL) 25 MG tablet Take 50 mg by mouth 2 (two) times daily as needed for allergies.    . hydrochlorothiazide (HYDRODIURIL) 25 MG tablet Take 1 tablet (25 mg total) by mouth every morning.    .Marland Kitchen  HYDROcodone-acetaminophen (NORCO) 5-325 MG tablet Take 1 tablet by mouth every 6 (six) hours as needed for severe pain. 10 tablet 0  . lidocaine (LIDODERM) 5 % Place 1 patch onto the skin daily. Remove & Discard patch within 12 hours or as directed by MD 30 patch 0  . lisinopril (ZESTRIL) 40 MG tablet Take 1 tablet (40 mg total) by mouth every morning.    . Multiple Vitamin (MULTIVITAMIN WITH MINERALS) TABS tablet Take 1 tablet by mouth daily.    . simvastatin (ZOCOR) 10 MG tablet Take 10 mg by mouth daily.    Marland Kitchen triamcinolone cream (KENALOG) 0.1 % Apply 1 application topically 2 (two) times daily. 30 g 0  . VITAMIN E PO Take 1 capsule by mouth daily.     No current facility-administered medications on file prior to visit.    Allergies:   Allergies  Allergen Reactions  . Apple Itching    Reaction to red apple (only tolerates granny apples)  . Citrus Itching and Swelling    Tongue swells  . Other Other (See Comments)    Plums, pineapple, red apple Unnamed blood thinner- Caused   . Peach [Prunus Persica] Itching    Can eat canned peaches only  . Pineapple Itching  . Plum Pulp Itching  . Strawberry Extract Itching     Physical Exam  There were no vitals filed for this visit. There is no height or weight on file to calculate BMI. No exam data present  General:  pleasant  middle-aged African-American female, poor dentition, seated, in no evident distress Head: head normocephalic and atraumatic.   Neck: supple with no carotid or supraclavicular bruits Cardiovascular: regular rate and rhythm, no murmurs Musculoskeletal: no deformity Skin:  no rash/petichiae Vascular:  Normal pulses all extremities   Neurologic Exam Mental Status: Awake and fully alert.   Possible mild dysarthria but patient believes baseline with evidence of poor dentition.  Oriented to place and time. Recent and remote memory intact. Attention span, concentration and fund of knowledge appropriate. Mood and affect appropriate.  Cranial Nerves: Fundoscopic exam reveals sharp disc margins. Pupils equal, briskly reactive to light. Extraocular movements full without nystagmus. Visual fields full to confrontation. Hearing intact. Facial sensation intact. Face, tongue, palate moves normally and symmetrically.  Motor: Normal bulk and tone. Normal strength in all tested extremity muscles except mildly decreased right hip flexor weakness. Sensory.: intact to touch , pinprick , position and vibratory sensation.  Coordination: Rapid alternating movements normal in all extremities except mildly decreased right hand dexterity. Finger-to-nose and heel-to-shin performed accurately bilaterally. Gait and Station: Arises from chair without difficulty. Stance is normal. Gait demonstrates normal stride length and mild imbalance with use of Rollator walker Reflexes: 1+ and symmetric. Toes downgoing.     NIHSS  0 Modified Rankin  2    Diagnostic Data (Labs, Imaging, Testing)  CTA head:  1. No intracranial large vessel occlusion or proximal high-grade arterial stenosis.  2. Mild intracranial atherosclerotic disease as described.  3. Extensive partial opacification of the right nasal passage which may reflect secretions or polypoid soft tissue. Correlate clinically and consider direct visualization.   CTA neck:   1. Common carotid, internal carotid and vertebral arteries patent within the neck bilaterally without significant stenosis (50% or greater). Mild atherosclerosis within the visualized aortic arch.  2. Medialized course of the common carotid arteries bilaterally. Partially retropharyngeal course of the right ICA.  3. Medialized course of the caudal left internal jugular vein.   CT  head:  Study within normal limits.   CT cervical spine:  Areas of osteoarthritic change, primarily at C4-5 and C5-6. No fracture or spondylolisthesis. Apical lung scarring noted.    Mr Brain Wo Contrast 01/16/2019 IMPRESSION:  Small acute infarct posterior limb internal capsule on the left Mild chronic microvascular ischemic change in the white matter and pons.   ECG - SR rate 70 BPM. LVH (See cardiology reading for complete details)   ASSESSMENT: Katrina Schmidt is a 59 y.o. year old female presented with balance issues, weakness and dizziness on 01/17/2019 with stroke work-up revealing small acute left PLIC infarct secondary to small vessel disease. Vascular risk factors include HTN, HLD, tobacco use, and recent cocaine use.  Residual deficits mild right hemiparesis and imbalance with chronic lower back radiculopathy slowly worsening and interfering with therapy    PLAN:  1. Left PL IC stroke: Continue aspirin 81 mg daily  and atorvastatin for secondary stroke prevention. Maintain strict control of hypertension with blood pressure goal below 130/90, diabetes with hemoglobin A1c goal below 6.5% and cholesterol with LDL cholesterol (bad cholesterol) goal below 70 mg/dL.  I also advised the patient to eat a healthy diet with plenty of whole grains, cereals, fruits and vegetables, exercise regularly with at least 30 minutes of continuous activity daily and maintain ideal body weight.  Advised to continue to work with outpatient therapies for ongoing balance deficit post stroke 2. HTN: BP goal<130/90.  Stable.   Managed by PCP 3. HLD: LDL goal<70.  Continue atorvastatin.  Management prescriber PCP 4. Substance/tobacco abuse: Discussion regarding importance of complete tobacco and alcohol cessation for secondary stroke prevention.  Congratulated her on illicit drug use cessation 5. Left-sided lumbar radiculopathy: Pain continued over past 3 years which has been gradually worsening despite prior conservative treatment measures.  Currently interfering with therapy with recent stroke affecting balance.  Will obtain MRI lumbar to rule out underlying abnormalities causing continued radiculopathy with potential referral to neurosurgery or orthopedics if indicated    Follow up in 3 months or call earlier if needed   I spent *** minutes of face-to-face and non-face-to-face time with patient.  This included previsit chart review, lab review, study review, order entry, electronic health record documentation, patient education    Ihor Austin, [facility]  [facility] [address] [place], [place] [address]  Phone [phone] Fax [phone] Note: This document was prepared with digital dictation and possible smart phrase technology. Any transcriptional errors that result from this process are unintentional.

## 2019-12-18 ENCOUNTER — Other Ambulatory Visit: Payer: Self-pay | Admitting: Physical Medicine & Rehabilitation

## 2019-12-18 DIAGNOSIS — M545 Low back pain, unspecified: Secondary | ICD-10-CM

## 2020-01-10 ENCOUNTER — Ambulatory Visit
Admission: RE | Admit: 2020-01-10 | Discharge: 2020-01-10 | Disposition: A | Discharge status: Home / Self Care | Payer: Medicaid Other | Source: Ambulatory Visit | Attending: Physical Medicine & Rehabilitation | Admitting: Physical Medicine & Rehabilitation

## 2020-01-10 DIAGNOSIS — M545 Low back pain, unspecified: Secondary | ICD-10-CM

## 2020-01-22 ENCOUNTER — Other Ambulatory Visit: Payer: Self-pay | Admitting: Internal Medicine

## 2020-01-22 ENCOUNTER — Other Ambulatory Visit: Payer: Self-pay

## 2020-01-22 DIAGNOSIS — N644 Mastodynia: Secondary | ICD-10-CM

## 2020-02-07 ENCOUNTER — Other Ambulatory Visit: Payer: Medicaid Other

## 2020-02-09 ENCOUNTER — Other Ambulatory Visit: Payer: Medicaid Other

## 2020-02-16 ENCOUNTER — Ambulatory Visit: Payer: Medicaid Other | Admitting: Podiatry

## 2020-02-24 ENCOUNTER — Ambulatory Visit: Payer: Medicaid Other | Admitting: Podiatry

## 2020-03-01 ENCOUNTER — Ambulatory Visit
Admission: RE | Admit: 2020-03-01 | Discharge: 2020-03-01 | Disposition: A | Discharge status: Home / Self Care | Payer: Medicaid Other | Source: Ambulatory Visit | Attending: Physical Medicine & Rehabilitation | Admitting: Physical Medicine & Rehabilitation

## 2020-03-01 IMAGING — MR MR LUMBAR SPINE W/O CM
4 of 5 series · 25 of 48 positions shown · non-contrast
Comparison: None available

CLINICAL DATA: Low back pain with left leg pain

EXAM:
MRI LUMBAR SPINE WITHOUT CONTRAST
TECHNIQUE: Multiplanar, multisequence MR imaging of the lumbar spine was
performed. No intravenous contrast was administered.

[Series 3: T2 post-contrast · sagittal · 4.0mm · 0.53mm/px · 7 of 15 slices shown]
[im 1/15]
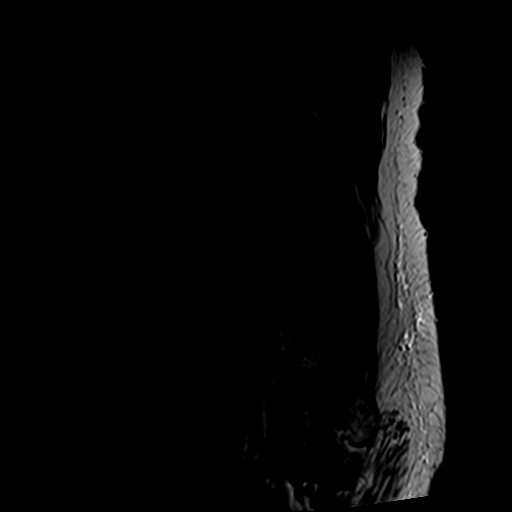
[im 3/15]
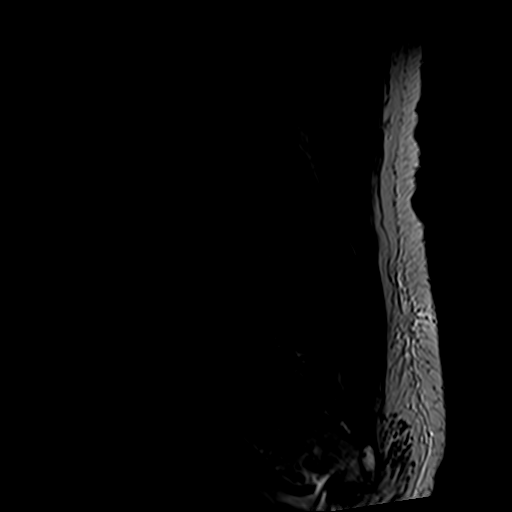
[im 5/15]
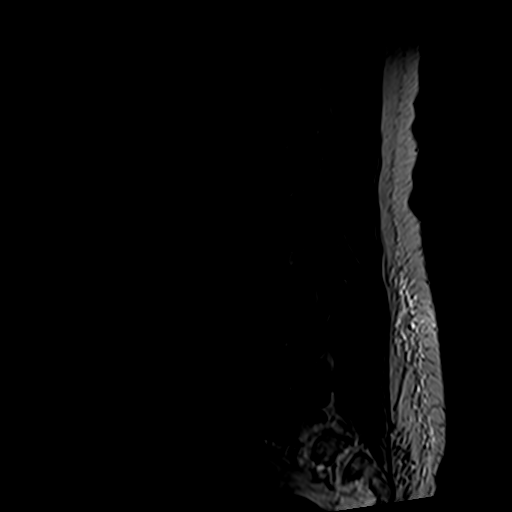
[im 8/15]
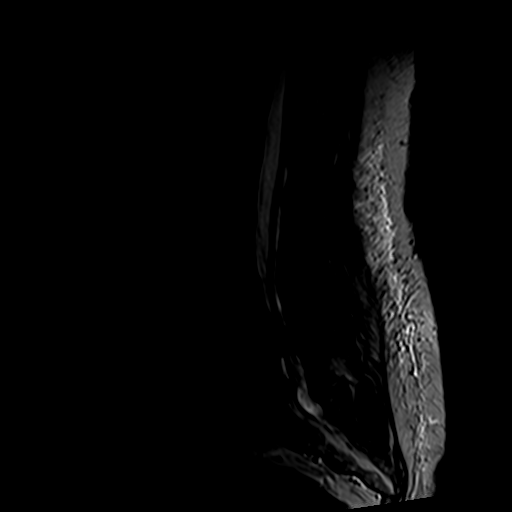
[im 10/15]
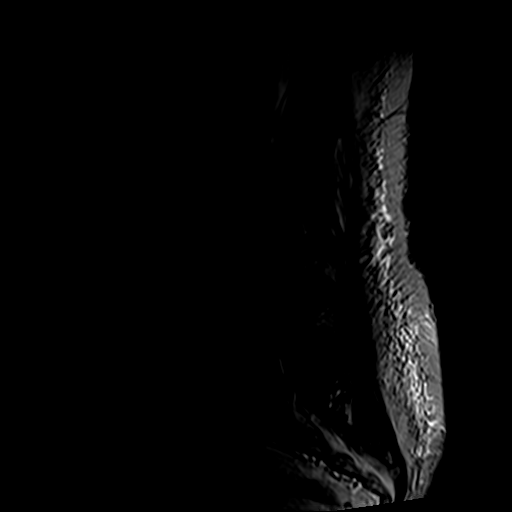
[im 12/15]
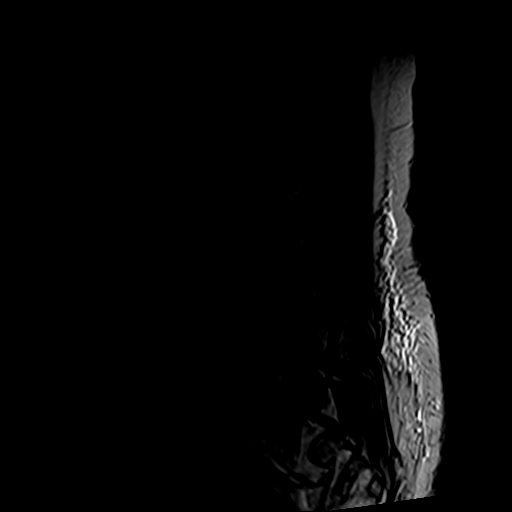
[im 15/15]
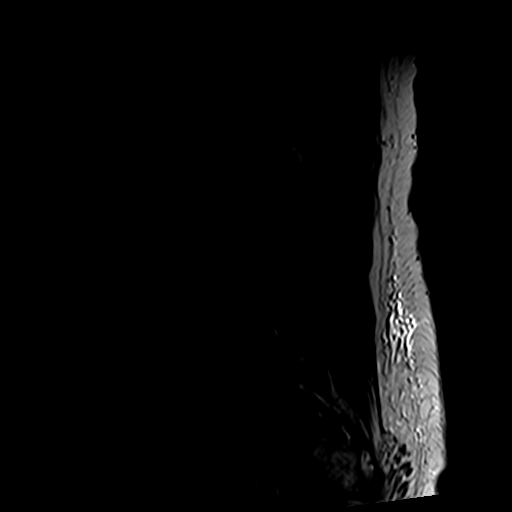

[Series 4: T1 · sagittal · 4.0mm · 0.53mm/px · 7 of 15 slices shown (1 of 2)]
[im 1/15]
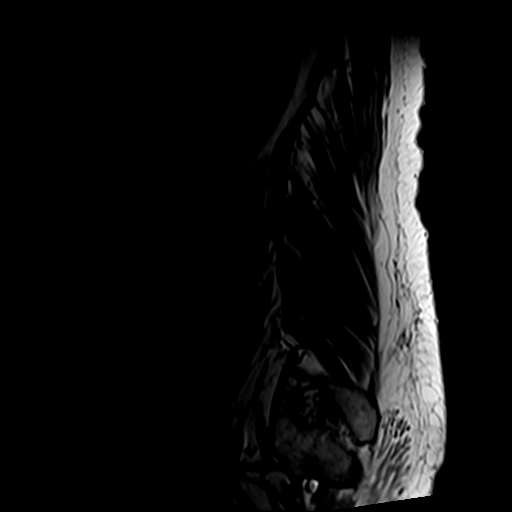
[im 3/15]
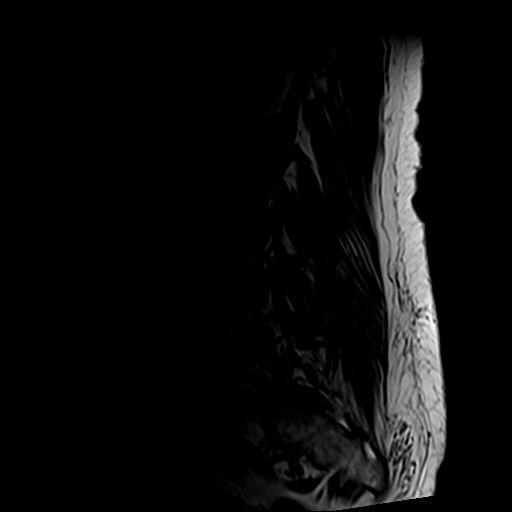
[im 5/15]
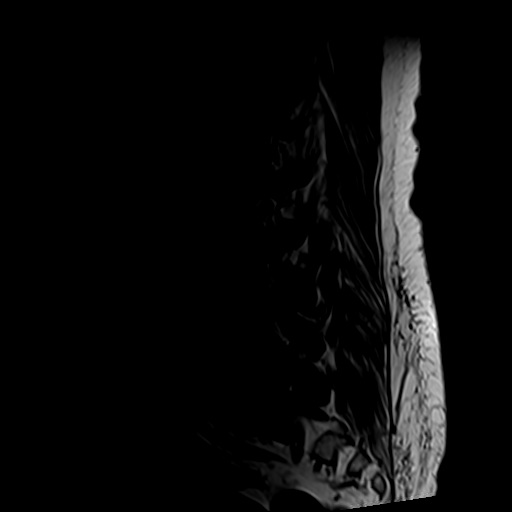
[im 8/15]
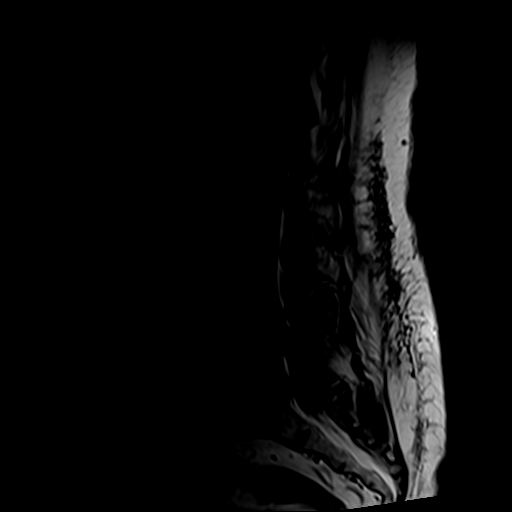
[im 10/15]
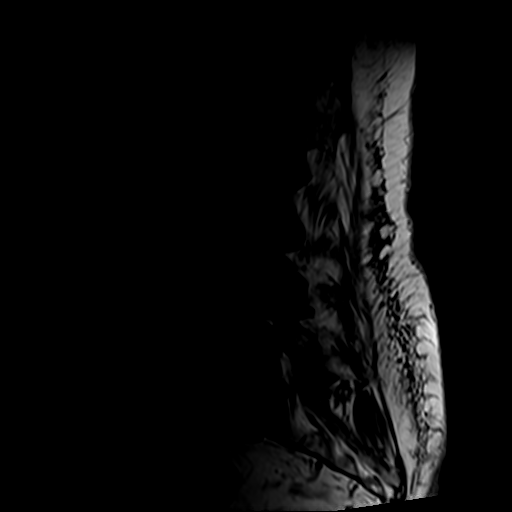
[im 12/15]
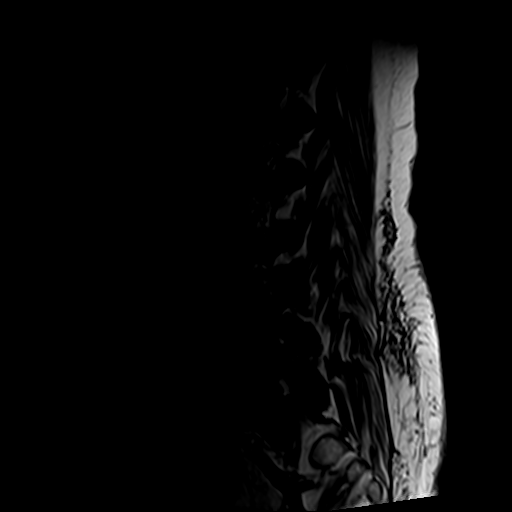
[im 15/15]
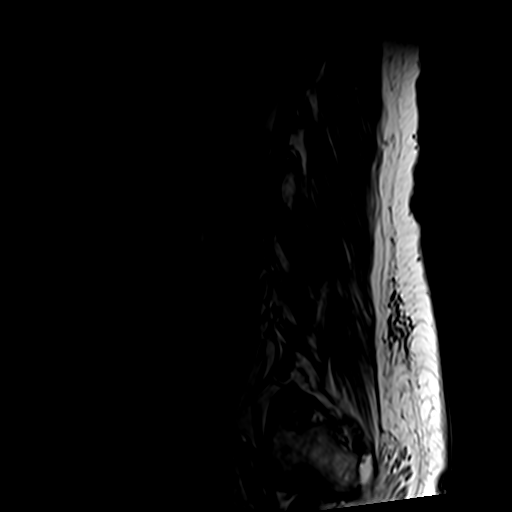

[Series 6: T2 · axial · 4.0mm · 0.70mm/px · z∈[-104,+98]mm · 8 of 34 slices shown]
[im 1/34]
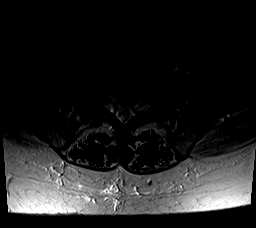
[im 6/34]
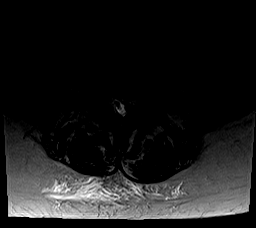
[im 11/34]
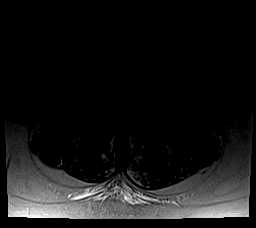
[im 16/34]
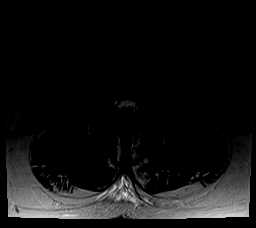
[im 18/34]
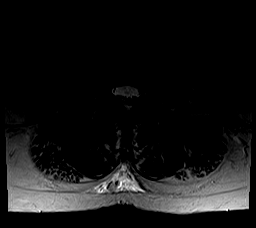
[im 23/34]
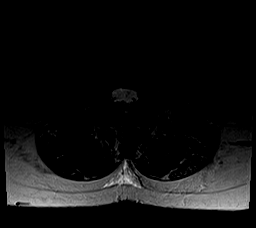
[im 28/34]
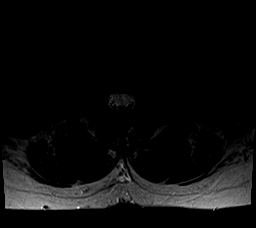
[im 34/34]
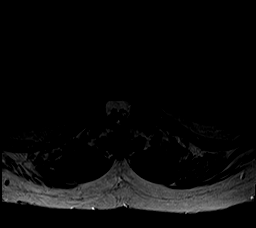

[Series 7: T1 · axial · 4.0mm · 0.35mm/px · z∈[-79,+67]mm · 3 of 34 slices shown (2 of 2)]
[im 6/34]
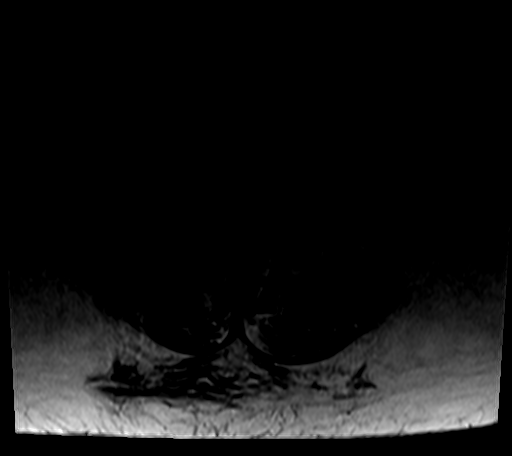
[im 18/34]
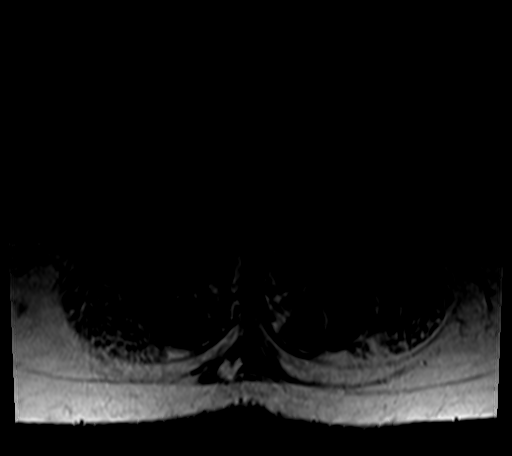
[im 28/34]
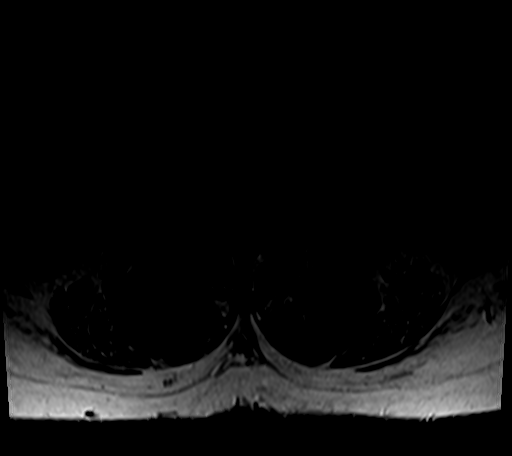

[25 of 48 positions shown; findings below may reference images not displayed]

FINDINGS: Segmentation:  Standard.

Alignment:  L4-5 grade 1 anterolisthesis, facet mediated

Vertebrae:  No fracture, evidence of discitis, or bone lesion.

Conus medullaris and cauda equina: Conus extends to the T12 level.
Conus and cauda equina appear normal.

Paraspinal and other soft tissues: Negative

Disc levels:

L3-L4: Degenerative facet spurring. Disc bulging with left foraminal
to far-lateral protrusion and annular fissure. No neural
compression.

L4-L5: Facet osteoarthritis with spurring and ligamentous
thickening. The disc is narrowed and bulging. Anterolisthesis with
severe spinal stenosis. Advanced right foraminal impingement with L4
root flattening

L5-S1:Disc narrowing and bulging with left paracentral protrusion
impinging on the left descending S1 nerve root. Degenerative facet
spurring. Asymmetric moderate left foraminal stenosis.
IMPRESSION: 1. Degenerative disease at L3-4 and below with L4-5 anterolisthesis.
2. L4-5 severe spinal and left foraminal stenosis.
3. L5-S1 moderate left subarticular recess and foraminal
impingement.
4. L3-4 left foraminal to far-lateral fissure and protrusion without
neural compression.

## 2020-03-31 ENCOUNTER — Emergency Department (HOSPITAL_COMMUNITY)
Admission: EM | Admit: 2020-03-31 | Discharge: 2020-03-31 | Disposition: A | Discharge status: Home / Self Care | Payer: Medicaid Other | Attending: Emergency Medicine | Admitting: Emergency Medicine

## 2020-03-31 ENCOUNTER — Other Ambulatory Visit: Payer: Self-pay

## 2020-03-31 ENCOUNTER — Emergency Department (HOSPITAL_COMMUNITY): Payer: Medicaid Other

## 2020-03-31 DIAGNOSIS — S46812A Strain of other muscles, fascia and tendons at shoulder and upper arm level, left arm, initial encounter: Secondary | ICD-10-CM

## 2020-03-31 DIAGNOSIS — I10 Essential (primary) hypertension: Secondary | ICD-10-CM | POA: Diagnosis not present

## 2020-03-31 DIAGNOSIS — X58XXXA Exposure to other specified factors, initial encounter: Secondary | ICD-10-CM | POA: Insufficient documentation

## 2020-03-31 DIAGNOSIS — Z8673 Personal history of transient ischemic attack (TIA), and cerebral infarction without residual deficits: Secondary | ICD-10-CM | POA: Insufficient documentation

## 2020-03-31 DIAGNOSIS — S4992XA Unspecified injury of left shoulder and upper arm, initial encounter: Secondary | ICD-10-CM | POA: Diagnosis present

## 2020-03-31 DIAGNOSIS — R202 Paresthesia of skin: Secondary | ICD-10-CM | POA: Diagnosis not present

## 2020-03-31 DIAGNOSIS — F17210 Nicotine dependence, cigarettes, uncomplicated: Secondary | ICD-10-CM | POA: Diagnosis not present

## 2020-03-31 DIAGNOSIS — R079 Chest pain, unspecified: Secondary | ICD-10-CM | POA: Diagnosis not present

## 2020-03-31 DIAGNOSIS — Z79899 Other long term (current) drug therapy: Secondary | ICD-10-CM | POA: Diagnosis not present

## 2020-03-31 DIAGNOSIS — Z7982 Long term (current) use of aspirin: Secondary | ICD-10-CM | POA: Diagnosis not present

## 2020-03-31 LAB — BASIC METABOLIC PANEL
Anion gap: 15 (ref 5–15)
BUN: 16 mg/dL (ref 6–20)
CO2: 23 mmol/L (ref 22–32)
Calcium: 9.1 mg/dL (ref 8.9–10.3)
Chloride: 102 mmol/L (ref 98–111)
Creatinine, Ser: 0.9 mg/dL (ref 0.44–1.00)
GFR, Estimated: >60 mL/min (ref >60)
Glucose, Bld: 114 mg/dL — ABNORMAL HIGH (ref 70–99)
Potassium: 3.9 mmol/L (ref 3.5–5.1)
Sodium: 140 mmol/L (ref 135–145)

## 2020-03-31 LAB — CBC
HCT: 49.7 % — ABNORMAL HIGH (ref 36.0–46.0)
Hemoglobin: 16.3 g/dL — ABNORMAL HIGH (ref 12.0–15.0)
MCH: 32 pg (ref 26.0–34.0)
MCHC: 32.8 g/dL (ref 30.0–36.0)
MCV: 97.5 fL (ref 80.0–100.0)
Platelets: 352 10*3/uL (ref 150–400)
RBC: 5.1 MIL/uL (ref 3.87–5.11)
RDW: 13.5 % (ref 11.5–15.5)
WBC: 8.1 10*3/uL (ref 4.0–10.5)
nRBC: 0 % (ref 0.0–0.2)

## 2020-03-31 LAB — I-STAT BETA HCG BLOOD, ED (MC, WL, AP ONLY): I-stat hCG, quantitative: <5 m[IU]/mL (ref <5)

## 2020-03-31 LAB — TROPONIN I (HIGH SENSITIVITY): Troponin I (High Sensitivity): 15 ng/L (ref <18)

## 2020-03-31 IMAGING — CR DG CHEST 2V
2 series · 2 of 2 positions shown · non-contrast
Comparison: None.

CLINICAL DATA: cp

EXAM:
CHEST - 2 VIEW

[chest pa]
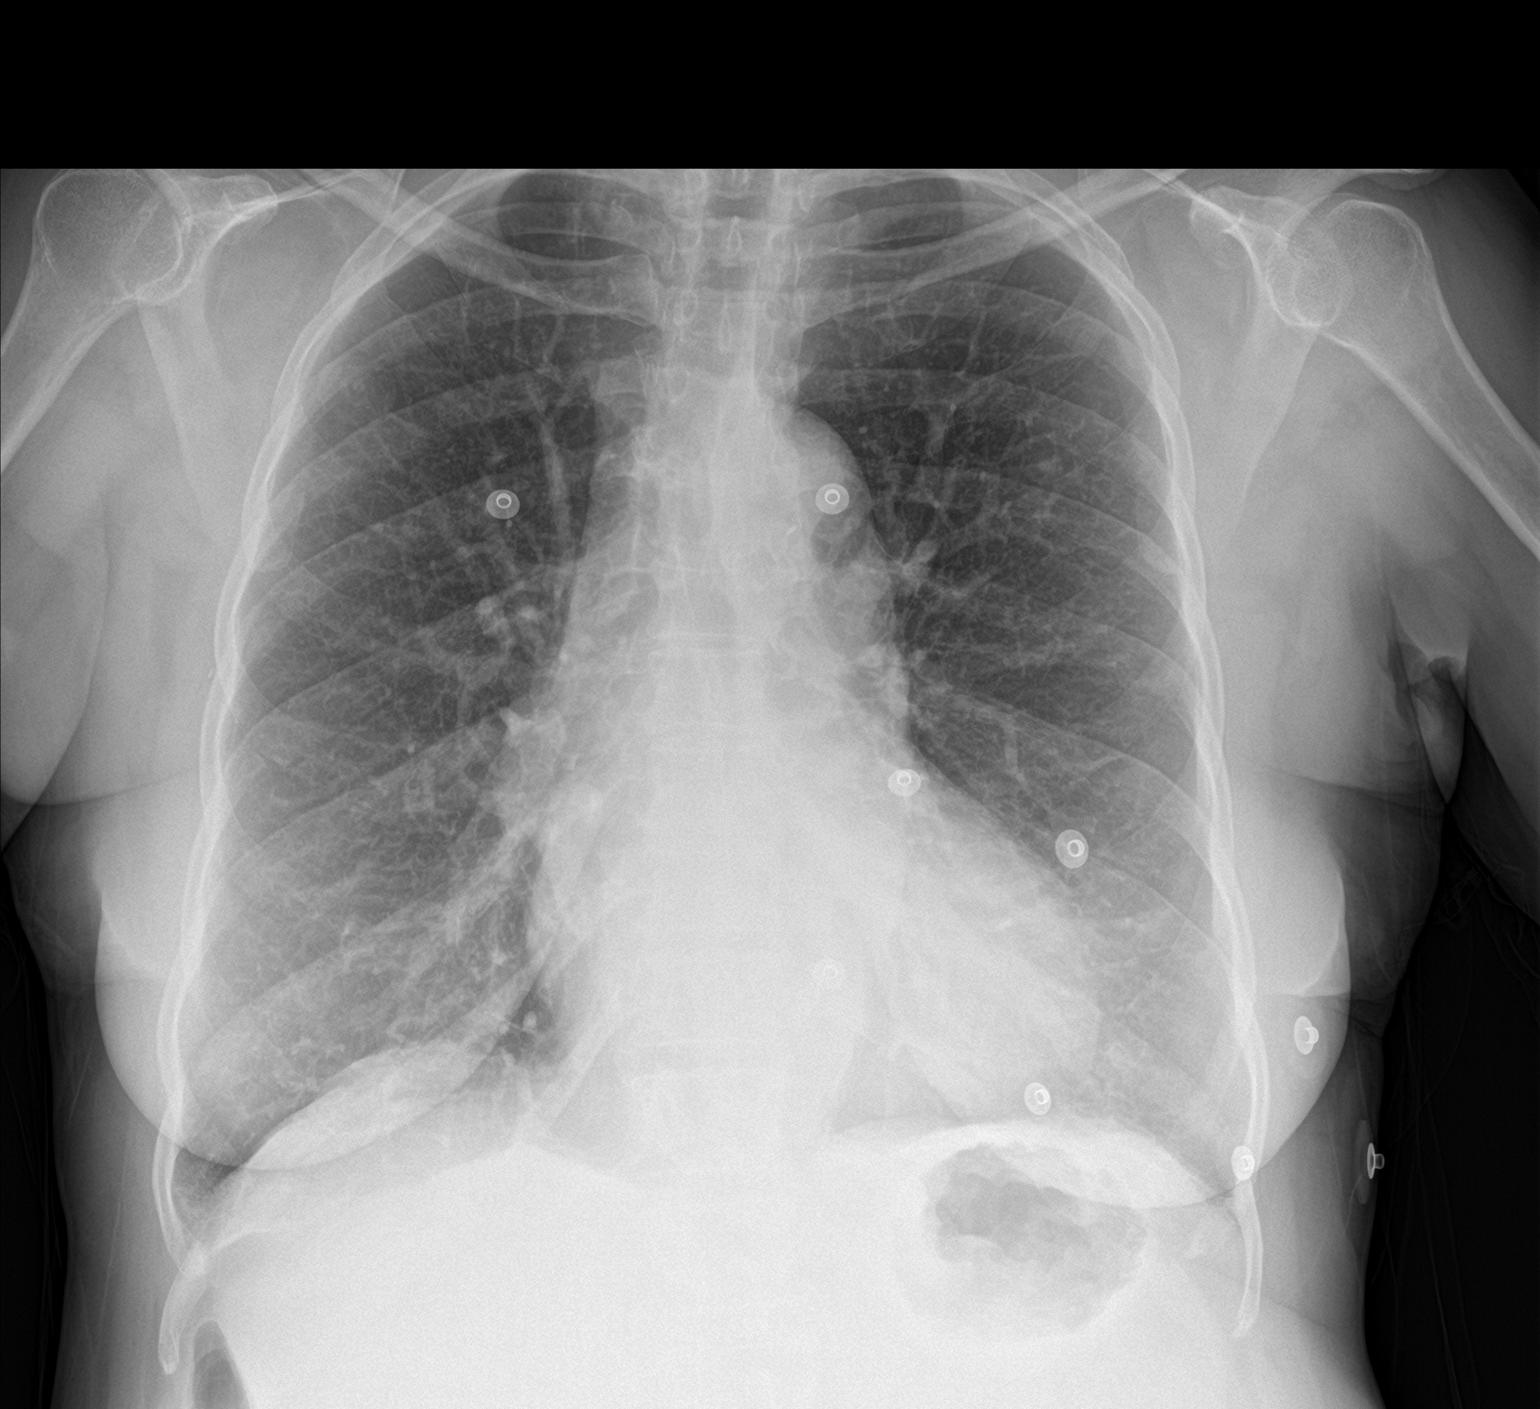

[chest lat]
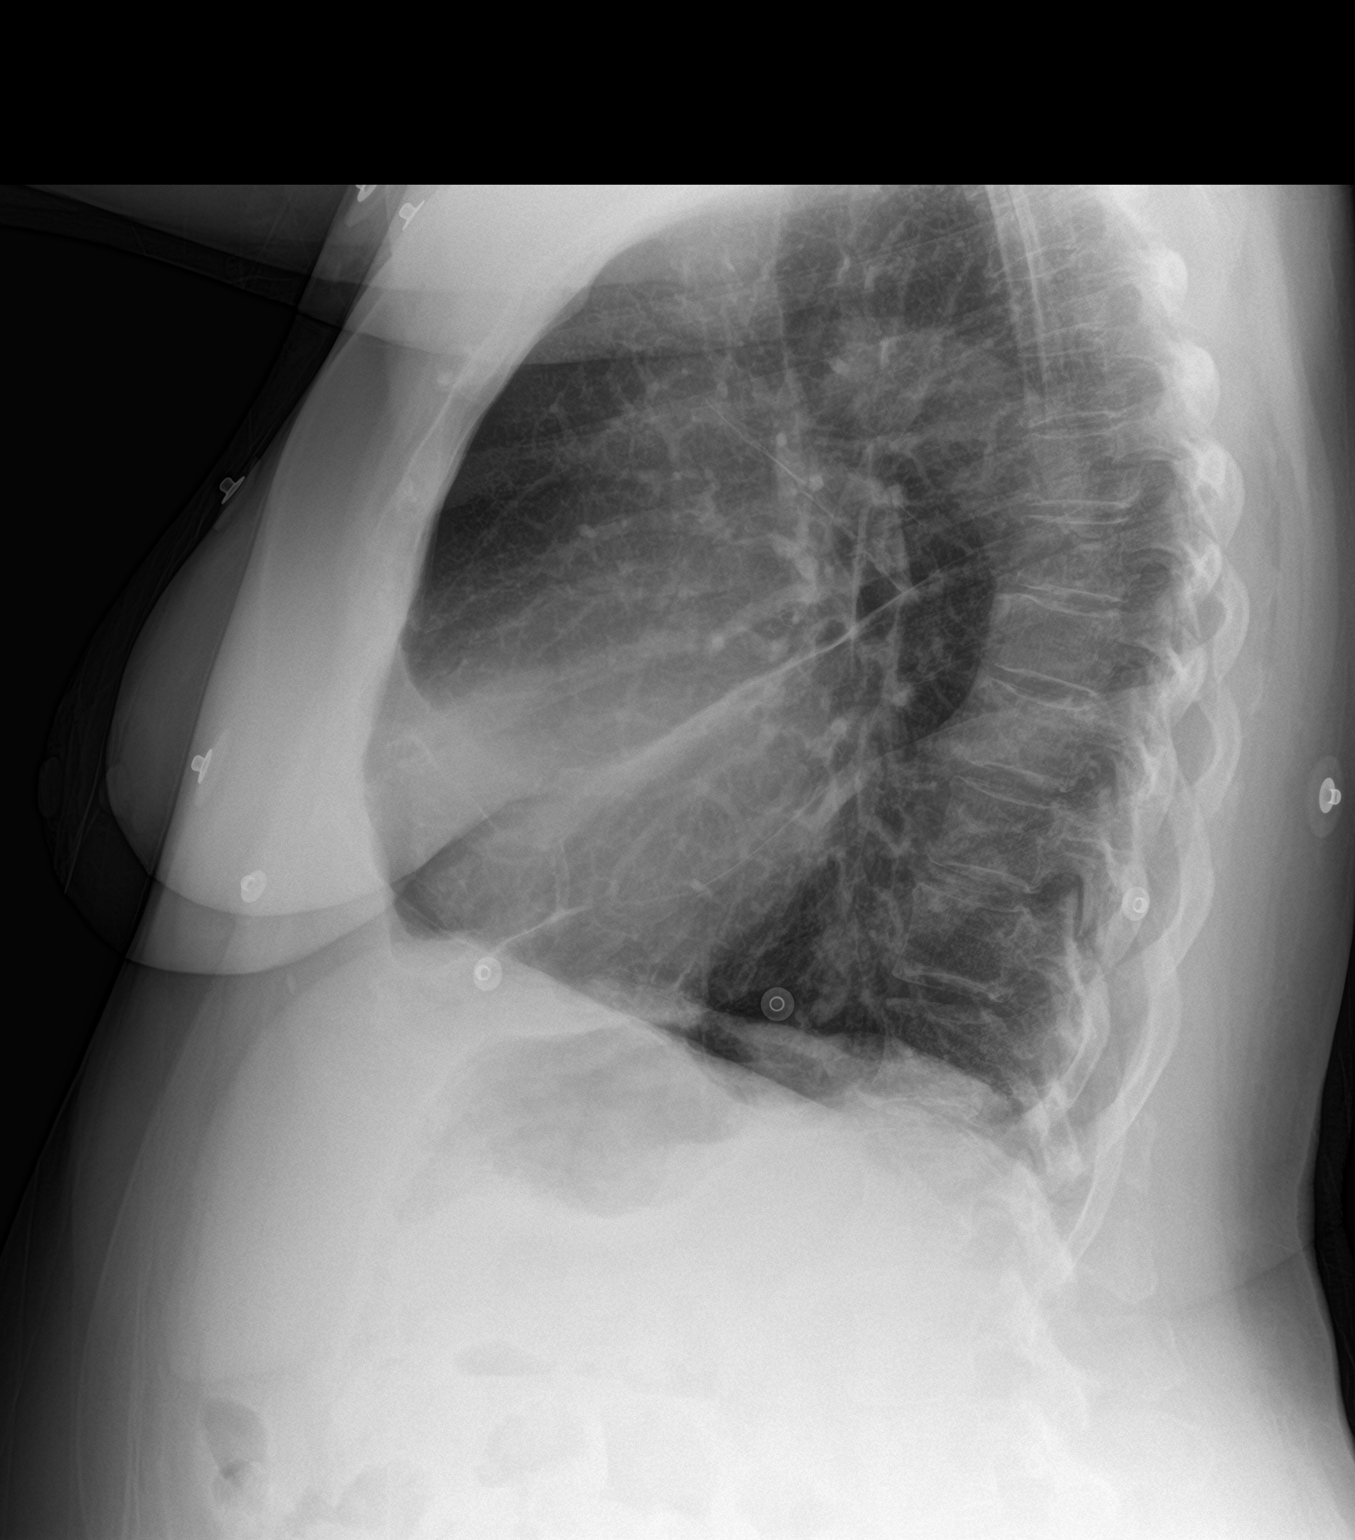

[2 of 2 positions shown; findings below may reference images not displayed]

FINDINGS: Diffuse interstitial prominence. No pneumothorax or pleural
effusion. No focal consolidation. Mild cardiomegaly. No acute
osseous abnormality.
IMPRESSION: No focal consolidation.  Emphysema.

Mild cardiomegaly.

## 2020-03-31 MED ORDER — DICLOFENAC SODIUM 1 % EX GEL: 4.0000 g | Freq: Four times a day (QID) | CUTANEOUS | 0 refills | Status: AC

## 2020-03-31 NOTE — ED Notes (Addendum)
Pt refused labs.  

## 2020-03-31 NOTE — ED Notes (Signed)
Pt states that she had numbness and tingling in her face around the same time that she was experiencing pain and tingling in her left shoulder and arm.

## 2020-03-31 NOTE — ED Notes (Signed)
DC instructions reviewed with pt and family.  Pt verbalized understanding. Pt DC.  

## 2020-03-31 NOTE — ED Triage Notes (Signed)
One week ago pt developed L back/shoulder pain, worse with movement and when she would go outside. Pt woke up this morning with additional pain to L chest and pain/tingling to mouth and L arm. Hypertensive, although endorses taking twice her dose of prescribed anti-hypertensives every day. Pt refusing labs in triage.

## 2020-03-31 NOTE — ED Provider Notes (Signed)
[facility] EMERGENCY DEPARTMENT Provider Note   CSN: [id number] Arrival date & time: 03/31/20  1330     History Chief Complaint  Patient presents with  . Chest Pain    Katrina Schmidt is a 59 y.o. female.  59 yo F with a chief complaint of left shoulder pain.  Is been going on for about a week now.  Worse with certain positions.  After she has pain for a while she feels like she gets numb to her left arm and into the left side of her face.  She is concerned because she had had a stroke in the past.  Eventually because it had not resolved over the course of the week she decided come in for evaluation.  She denies trauma.  Denies cough congestion or fever.  Describes a sharp pain.  Described the numbness as a tingling sensation.  Denied any weakness to the hand or the face.  Denies difficulty with speech or swallowing.  The history is provided by the patient.  Chest Pain Pain location:  L chest Pain quality: aching and sharp   Pain radiates to:  L arm Pain severity:  Moderate Onset quality:  Gradual Duration:  1 week Timing:  Intermittent Progression:  Waxing and waning Chronicity:  New Relieved by:  Certain positions Worsened by:  Deep breathing and certain positions Ineffective treatments:  None tried Associated symptoms: no dizziness, no fever, no headache, no nausea, no palpitations, no shortness of breath and no vomiting        Past Medical History:  Diagnosis Date  . Anxiety   . Chronic bronchitis (HCC)   . DDD (degenerative disc disease), lumbosacral   . DJD (degenerative joint disease)   . Hyperlipidemia   . Hypertension   . Sciatica     Patient Active Problem List   Diagnosis Date Noted  . Pain due to onychomycosis of toenails of both feet 08/11/2019  . CVA (cerebral vascular accident) (HCC) 01/16/2019  . Hypertension 01/16/2019  . Hyperlipidemia 01/16/2019  . Tobacco abuse 01/16/2019  . Alcohol abuse 01/16/2019  . Sciatica  01/16/2019  . Panic attacks 01/16/2019    Past Surgical History:  Procedure Laterality Date  . HERNIA REPAIR       OB History   No obstetric history on file.     Family History  Problem Relation Age of Onset  . Hypertension Mother     Social History   Tobacco Use  . Smoking status: Current Every Day Smoker    Packs/day: 0.50    Years: 40.00    Pack years: 20.00    Types: Cigarettes  . Smokeless tobacco: Never Used  Vaping Use  . Vaping Use: Never used  Substance Use Topics  . Alcohol use: Yes    Comment: Drinks 60oz beer daily  . Drug use: No    Home Medications Prior to Admission medications   Medication Sig Start Date End Date Taking? Authorizing Provider  acetaminophen (TYLENOL) 500 MG tablet Take 1,000-2,000 mg by mouth 2 (two) times daily as needed (pain).    [provider]  albuterol (VENTOLIN HFA) 108 (90 Base) MCG/ACT inhaler Inhale 2 puffs into the lungs every 6 (six) hours as needed for wheezing or shortness of breath.    [provider]  ALPRAZolam Prudy Feeler) 0.5 MG tablet Take 1 mg by mouth 2 (two) times daily as needed (panic attacks).  12/27/18   [provider]  amLODipine (NORVASC) 10 MG tablet Take  1 tablet (10 mg total) by mouth every morning. 01/20/19   Narda Bonds, MD  aspirin 325 MG tablet Take 81 mg by mouth daily.    [provider]  CALCIUM PO Take 1 tablet by mouth daily.    [provider]  Carboxymethylcellulose Sodium (ARTIFICIAL TEARS OP) Place 1 drop into both eyes 2 (two) times daily.    [provider]  Cholecalciferol (VITAMIN D3 PO) Take 1 tablet by mouth daily.    [provider]  CRANBERRY PO Take 1 tablet by mouth daily.    [provider]  Cyanocobalamin (VITAMIN B-12 PO) Take 1 tablet by mouth daily.    [provider]  diclofenac Sodium (VOLTAREN) 1 % GEL Apply 4 g topically 4 (four) times daily. 03/31/20   Melene Plan, DO  diphenhydrAMINE  (BENADRYL) 25 MG tablet Take 50 mg by mouth 2 (two) times daily as needed for allergies.    [provider]  hydrochlorothiazide (HYDRODIURIL) 25 MG tablet Take 1 tablet (25 mg total) by mouth every morning. 01/20/19   Narda Bonds, MD  HYDROcodone-acetaminophen (NORCO) 5-325 MG tablet Take 1 tablet by mouth every 6 (six) hours as needed for severe pain. 09/25/15   Street, Mercedes, PA-C  lidocaine (LIDODERM) 5 % Place 1 patch onto the skin daily. Remove & Discard patch within 12 hours or as directed by MD 01/10/18   Couture, Cortni S, PA-C  lisinopril (ZESTRIL) 40 MG tablet Take 1 tablet (40 mg total) by mouth every morning. 01/20/19   Narda Bonds, MD  Multiple Vitamin (MULTIVITAMIN WITH MINERALS) TABS tablet Take 1 tablet by mouth daily.    [provider]  simvastatin (ZOCOR) 10 MG tablet Take 10 mg by mouth daily.    [provider]  triamcinolone cream (KENALOG) 0.1 % Apply 1 application topically 2 (two) times daily. 09/23/16   Fawze, Mina A, PA-C  VITAMIN E PO Take 1 capsule by mouth daily.    [provider]    Allergies    Apple, Citrus, Other, Peach [prunus persica], Pineapple, Plum pulp, and Strawberry extract  Review of Systems   Review of Systems  Constitutional: Negative for chills and fever.  HENT: Negative for congestion and rhinorrhea.   Eyes: Negative for redness and visual disturbance.  Respiratory: Negative for shortness of breath and wheezing.   Cardiovascular: Positive for chest pain. Negative for palpitations.  Gastrointestinal: Negative for nausea and vomiting.  Genitourinary: Negative for dysuria and urgency.  Musculoskeletal: Positive for arthralgias and myalgias.  Skin: Negative for pallor and wound.  Neurological: Negative for dizziness and headaches.    Physical Exam Updated Vital Signs BP (!) 185/92   Pulse 89   Temp 98 F (36.7 C) (Oral)   Resp 18   SpO2 95%   Physical Exam Vitals and nursing note reviewed.   Constitutional:      General: She is not in acute distress.    Appearance: She is well-developed and well-nourished. She is not diaphoretic.  HENT:     Head: Normocephalic and atraumatic.  Eyes:     Extraocular Movements: EOM normal.     Pupils: Pupils are equal, round, and reactive to light.  Cardiovascular:     Rate and Rhythm: Normal rate and regular rhythm.     Heart sounds: No murmur heard. No friction rub. No gallop.   Pulmonary:     Effort: Pulmonary effort is normal.     Breath sounds: No wheezing or rales.  Abdominal:     General: There is no distension.     Palpations: Abdomen is soft.     Tenderness: There is no abdominal tenderness.  Musculoskeletal:        General: No tenderness or edema.     Cervical back: Normal range of motion and neck supple.     Comments: Focal tenderness about the trapezius muscle belly on the left.  Reproduces the patient's symptoms.  Pulse motor and sensation are intact to left upper extremity.  Negative Spurling's test.  Skin:    General: Skin is warm and dry.  Neurological:     Mental Status: She is alert and oriented to person, place, and time.  Psychiatric:        Mood and Affect: Mood and affect normal.        Behavior: Behavior normal.     ED Results / Procedures / Treatments   Labs (all labs ordered are listed, but only abnormal results are displayed) Labs Reviewed  BASIC METABOLIC PANEL - Abnormal; Notable for the following components:      Result Value   Glucose, Bld 114 (*)    All other components within normal limits  CBC - Abnormal; Notable for the following components:   Hemoglobin 16.3 (*)    HCT 49.7 (*)    All other components within normal limits  I-STAT BETA HCG BLOOD, ED ([facility], [facility], [facility] ONLY)  TROPONIN I (HIGH SENSITIVITY)    EKG EKG Interpretation  Date/Time:  Thursday March 31 2020 17:19:57 EST Ventricular Rate:  82 PR Interval:  154 QRS Duration: 79 QT Interval:  387 QTC Calculation: 452 R  Axis:   37 Text Interpretation: Sinus rhythm Probable left atrial enlargement LVH with secondary repolarization abnormality Anterior Q waves, possibly due to LVH No significant change since last tracing Confirmed by Melene PlanFloyd, Tea Collums [phone]([id number]) on 03/31/2020 6:35:11 PM   Radiology DG Chest 2 View  Result Date: 03/31/2020 CLINICAL DATA:  cp EXAM: CHEST - 2 VIEW COMPARISON:  None. FINDINGS: Diffuse interstitial prominence. No pneumothorax or pleural effusion. No focal consolidation. Mild cardiomegaly. No acute osseous abnormality. IMPRESSION: No focal consolidation.  Emphysema. Mild cardiomegaly. Electronically Signed   By: Stana Buntinghikanele  Emekauwa M.D.   On: 03/31/2020 13:49    Procedures Procedures (including critical care time)  Medications Ordered in ED Medications - No data to display  ED Course  I have reviewed the triage vital signs and the nursing notes.  Pertinent labs & imaging results that were available during my care of the patient were reviewed by me and considered in my medical decision making (see chart for details).    MDM Rules/Calculators/A&P                          59 yo F with a chief complaint of left shoulder pain.  Going on for the past week.  Reproduced on exam.  Likely muscular strain.  Patient was concerned about a stroke seems less likely.  No focal deficit to the area in question.  Will treat symptomatically.  PCP follow-up.  Patient had a chest pain work-up ordered through triage.  Initial troponin is negative.  EKG without any change from prior.  Chest x-ray viewed by me without focal infiltrate or pneumothorax.  As her pain is completely atypical from ACS and has been ongoing for a week do not feel I need to repeat a troponin.  8:31 PM:  I have discussed the diagnosis/risks/treatment options  with the patient and believe the pt to be eligible for discharge home to follow-up with PCP. We also discussed returning to the ED immediately if new or worsening sx occur. We  discussed the sx which are most concerning (e.g., sudden worsening pain, fever, inability to tolerate by mouth) that necessitate immediate return. Medications administered to the patient during their visit and any new prescriptions provided to the patient are listed below.  Medications given during this visit Medications - No data to display   The patient appears reasonably screen and/or stabilized for discharge and I doubt any other medical condition or other [facility] requiring further screening, evaluation, or treatment in the ED at this time prior to discharge.   Final Clinical Impression(s) / ED Diagnoses Final diagnoses:  Trapezius strain, left, initial encounter    Rx / DC Orders ED Discharge Orders         Ordered    diclofenac Sodium (VOLTAREN) 1 % GEL  4 times daily        03/31/20 2010           Melene Plan, DO 03/31/20 2032

## 2020-03-31 NOTE — Discharge Instructions (Signed)
Most likely of a muscular strain.  Please take your normal pain medicine that you take for your low back for this.  He can also apply the gel that I have prescribed.  Follow-up with your family doctor in the office.

## 2020-04-06 ENCOUNTER — Other Ambulatory Visit: Payer: Self-pay | Admitting: Neurological Surgery

## 2020-04-06 ENCOUNTER — Other Ambulatory Visit: Payer: Self-pay | Admitting: Neurosurgery

## 2020-04-07 ENCOUNTER — Ambulatory Visit: Payer: Medicaid Other | Admitting: Adult Health

## 2020-04-07 ENCOUNTER — Other Ambulatory Visit: Payer: Self-pay | Admitting: Neurosurgery

## 2020-04-21 ENCOUNTER — Ambulatory Visit: Payer: Medicaid Other | Admitting: Adult Health

## 2020-04-25 NOTE — Progress Notes (Signed)
[facility] - [place], [place] - [address] [place] [address] Phone: [phone] Fax: [phone]      Your procedure is scheduled on 05/02/20.  Report to [facility] Main Entrance "A" at 08:30 A.M., and check in at the Admitting office.  Call this number if you have problems the morning of surgery:  [phone]  Call [phone] if you have any questions prior to your surgery date Monday-Friday 8am-4pm    Remember:  Do not eat or drink after midnight the night before your surgery     Take these medicines the morning of surgery with A SIP OF WATER  albuterol (VENTOLIN HFA)- if needed ALPRAZolam Prudy Feeler)- if needed Carboxymethylcellulose Sodium (ARTIFICIAL TEARS OP)- if needed HYDROcodone-acetaminophen [place]) - if needed simvastatin (ZOCOR)  As of today, STOP taking any Aspirin (unless otherwise instructed by your surgeon) Aleve, Naproxen, Ibuprofen, Motrin, Advil, Goody's, BC's, all herbal medications, fish oil, and all vitamins. This includes your vdiclofenac sodium (Voltaren).                      Do not wear jewelry, make up, or nail polish            Do not wear lotions, powders, perfumes/colognes, or deodorant.            Do not shave 48 hours prior to surgery.  Men may shave face and neck.            Do not bring valuables to the hospital.            [facility] is not responsible for any belongings or valuables.  Do NOT Smoke (Tobacco/Vaping) or drink Alcohol 24 hours prior to your procedure If you use a CPAP at night, you may bring all equipment for your overnight stay.   Contacts, glasses, dentures or bridgework may not be worn into surgery.      For patients admitted to the hospital, discharge time will be determined by your treatment team.   Patients discharged the day of surgery will not be allowed to drive home, and someone needs to stay with them for 24 hours.    Special instructions:   [place]- Preparing For  Surgery  Before surgery, you can play an important role. Because skin is not sterile, your skin needs to be as free of germs as possible. You can reduce the number of germs on your skin by washing with CHG (chlorahexidine gluconate) Soap before surgery.  CHG is an antiseptic cleaner which kills germs and bonds with the skin to continue killing germs even after washing.    Oral Hygiene is also important to reduce your risk of infection.  Remember - BRUSH YOUR TEETH THE MORNING OF SURGERY WITH YOUR REGULAR TOOTHPASTE  Please do not use if you have an allergy to CHG or antibacterial soaps. If your skin becomes reddened/irritated stop using the CHG.  Do not shave (including legs and underarms) for at least 48 hours prior to first CHG shower. It is OK to shave your face.  Please follow these instructions carefully.   1. Shower the NIGHT BEFORE SURGERY and the MORNING OF SURGERY with CHG Soap.   2. If you chose to wash your hair, wash your hair first as usual with your normal shampoo.  3. After you shampoo, rinse your hair and body thoroughly to remove the shampoo.  4. Use CHG as you would any other liquid soap. You can apply  CHG directly to the skin and wash gently with a scrungie or a clean washcloth.   5. Apply the CHG Soap to your body ONLY FROM THE NECK DOWN.  Do not use on open wounds or open sores. Avoid contact with your eyes, ears, mouth and genitals (private parts). Wash Face and genitals (private parts)  with your normal soap.   6. Wash thoroughly, paying special attention to the area where your surgery will be performed.  7. Thoroughly rinse your body with warm water from the neck down.  8. DO NOT shower/wash with your normal soap after using and rinsing off the CHG Soap.  9. Pat yourself dry with a CLEAN TOWEL.  10. Wear CLEAN PAJAMAS to bed the night before surgery  11. Place CLEAN SHEETS on your bed the night of your first shower and DO NOT SLEEP WITH PETS.   Day of  Surgery: Wear Clean/Comfortable clothing the morning of surgery Do not apply any deodorants/lotions.   Remember to brush your teeth WITH YOUR REGULAR TOOTHPASTE.   Please read over the following fact sheets that you were given.

## 2020-04-26 ENCOUNTER — Encounter (HOSPITAL_COMMUNITY)
Admission: RE | Admit: 2020-04-26 | Discharge: 2020-04-26 | Disposition: A | Discharge status: Home / Self Care | Payer: Medicaid Other | Source: Ambulatory Visit | Attending: Neurosurgery | Admitting: Neurosurgery

## 2020-04-26 ENCOUNTER — Other Ambulatory Visit: Payer: Self-pay

## 2020-04-26 ENCOUNTER — Encounter (HOSPITAL_COMMUNITY): Payer: Self-pay | Admitting: Vascular Surgery

## 2020-04-26 ENCOUNTER — Encounter (HOSPITAL_COMMUNITY): Payer: Self-pay

## 2020-04-26 DIAGNOSIS — Z01812 Encounter for preprocedural laboratory examination: Secondary | ICD-10-CM | POA: Insufficient documentation

## 2020-04-26 HISTORY — DX: Depression, unspecified: F32.A

## 2020-04-26 HISTORY — DX: Gastro-esophageal reflux disease without esophagitis: K21.9

## 2020-04-26 HISTORY — DX: Pneumonia, unspecified organism: J18.9

## 2020-04-26 HISTORY — DX: Bipolar disorder, unspecified: F31.9

## 2020-04-26 HISTORY — DX: Unspecified asthma, uncomplicated: J45.909

## 2020-04-26 HISTORY — DX: Anemia, unspecified: D64.9

## 2020-04-26 HISTORY — DX: Dyspnea, unspecified: R06.00

## 2020-04-26 HISTORY — DX: Headache, unspecified: R51.9

## 2020-04-26 HISTORY — DX: Cerebral infarction, unspecified: I63.9

## 2020-04-26 LAB — CBC WITH DIFFERENTIAL/PLATELET
Abs Immature Granulocytes: 0.02 10*3/uL (ref 0.00–0.07)
Basophils Absolute: 0.1 10*3/uL (ref 0.0–0.1)
Basophils Relative: 1 %
Eosinophils Absolute: 0.1 10*3/uL (ref 0.0–0.5)
Eosinophils Relative: 2 %
HCT: 45.3 % (ref 36.0–46.0)
Hemoglobin: 15.8 g/dL — ABNORMAL HIGH (ref 12.0–15.0)
Immature Granulocytes: 0 %
Lymphocytes Relative: 34 %
Lymphs Abs: 2.1 10*3/uL (ref 0.7–4.0)
MCH: 33.1 pg (ref 26.0–34.0)
MCHC: 34.9 g/dL (ref 30.0–36.0)
MCV: 94.8 fL (ref 80.0–100.0)
Monocytes Absolute: 0.4 10*3/uL (ref 0.1–1.0)
Monocytes Relative: 6 %
Neutro Abs: 3.5 10*3/uL (ref 1.7–7.7)
Neutrophils Relative %: 57 %
Platelets: 335 10*3/uL (ref 150–400)
RBC: 4.78 MIL/uL (ref 3.87–5.11)
RDW: 13.9 % (ref 11.5–15.5)
WBC: 6.2 10*3/uL (ref 4.0–10.5)
nRBC: 0 % (ref 0.0–0.2)

## 2020-04-26 LAB — COMPREHENSIVE METABOLIC PANEL
ALT: 19 U/L (ref 0–44)
AST: 17 U/L (ref 15–41)
Albumin: 3.6 g/dL (ref 3.5–5.0)
Alkaline Phosphatase: 54 U/L (ref 38–126)
Anion gap: 13 (ref 5–15)
BUN: 15 mg/dL (ref 6–20)
CO2: 23 mmol/L (ref 22–32)
Calcium: 9.2 mg/dL (ref 8.9–10.3)
Chloride: 101 mmol/L (ref 98–111)
Creatinine, Ser: 0.86 mg/dL (ref 0.44–1.00)
GFR, Estimated: >60 mL/min (ref >60)
Glucose, Bld: 129 mg/dL — ABNORMAL HIGH (ref 70–99)
Potassium: 3.4 mmol/L — ABNORMAL LOW (ref 3.5–5.1)
Sodium: 137 mmol/L (ref 135–145)
Total Bilirubin: 0.5 mg/dL (ref 0.3–1.2)
Total Protein: 6.3 g/dL — ABNORMAL LOW (ref 6.5–8.1)

## 2020-04-26 LAB — TYPE AND SCREEN
ABO/RH(D): AB POS
Antibody Screen: NEGATIVE

## 2020-04-26 NOTE — Progress Notes (Signed)
PCP - Dr Vedia Pereyra Cardiologist - denies  PPM/ICD - denies   Chest x-ray - 03/31/20 EKG - 04/01/2020 Stress Test - denies ECHO - 01/17/19 Cardiac Cath - denies  Sleep Study - denies  Patient instructed to hold all Aspirin, NSAID's, herbal medications, fish oil and vitamins 7 days prior to surgery.   ERAS Protcol -no   COVID TEST- 04/29/2020   Anesthesia review: yes, hx of substance use and elevated blood pressure. Fayrene Fearing, PA-C saw patient the day of PAT appt.   Patient denies shortness of breath, fever, cough and chest pain at PAT appointment   All instructions explained to the patient, with a verbal understanding of the material. Patient agrees to go over the instructions while at home for a better understanding. Patient also instructed to self quarantine after being tested for COVID-19. The opportunity to ask questions was provided.

## 2020-04-27 ENCOUNTER — Encounter: Payer: Self-pay | Admitting: Rehabilitation

## 2020-04-27 LAB — SURGICAL PCR SCREEN
MRSA, PCR: POSITIVE — AB
Staphylococcus aureus: POSITIVE — AB

## 2020-04-27 NOTE — Therapy (Signed)
[facility] [address] [place], [place], [address] Phone: [phone]   Fax:  [phone]  Patient Details  Name: Katrina Schmidt MRN: [id number] Date of Birth: 27-May-1960 Referring Provider:  No ref. provider found  Encounter Date: 04/27/2020   PHYSICAL THERAPY DISCHARGE SUMMARY  Visits from Start of Care: 3  Current functional level related to goals / functional outcomes:  PT Long Term Goals - 02/10/19 1734      PT LONG TERM GOAL #1   Title Pt will improve BERG by 6 points to show improved balance. (Target for all LTG 12 weeks 05/05/2019)    Baseline 41    Status New      PT LONG TERM GOAL #2   Title Pt will improve 5TSTS test by 10 sec to show improved balance and strength.    Baseline 25 sec    Status New      PT LONG TERM GOAL #3   Title Pt will improve TUG by 5 seconds with LRAD to show improved gait speed and balance.    Baseline 19 sec used rollator    Status New      PT LONG TERM GOAL #4   Title Pt will be able to ambulate community distances at least 500 ft with LRAD with improved gait pattern and foot clearance.    Baseline only able to walk 100 ft at most needs rolloator and supervision due to poor balance and foot clearance.    Status New             Remaining deficits: Unsure as pt did not return following increased low back pain and getting MRI for lumbar spine.    Education / Equipment: HEP   Plan: Patient agrees to discharge.  Patient goals were not met. Patient is being discharged due to a change in medical status.  ?????     Cameron Sprang, PT, MPT [facility] [address] [place], [place], [address] Phone: [phone]   Fax:  [phone] 04/27/20, 1:48 PM

## 2020-04-29 ENCOUNTER — Other Ambulatory Visit (HOSPITAL_COMMUNITY): Payer: Medicaid Other

## 2020-05-02 ENCOUNTER — Encounter (HOSPITAL_COMMUNITY): Admission: RE | Payer: Self-pay | Source: Home / Self Care

## 2020-05-02 ENCOUNTER — Inpatient Hospital Stay (HOSPITAL_COMMUNITY): Admission: RE | Admit: 2020-05-02 | Payer: Medicaid Other | Source: Home / Self Care | Admitting: Neurosurgery

## 2020-05-02 SURGERY — POSTERIOR LUMBAR FUSION 3 LEVEL
Anesthesia: General | Site: Back

## 2020-06-27 ENCOUNTER — Ambulatory Visit: Payer: Medicaid Other | Admitting: Adult Health

## 2020-06-28 ENCOUNTER — Encounter: Payer: Self-pay | Admitting: Adult Health

## 2020-10-21 ENCOUNTER — Other Ambulatory Visit: Payer: Self-pay | Admitting: Neurosurgery

## 2020-11-16 ENCOUNTER — Other Ambulatory Visit (HOSPITAL_COMMUNITY): Payer: Medicaid Other

## 2020-11-16 NOTE — Progress Notes (Signed)
Surgical Instructions    Your procedure is scheduled on 11/21/20.  Report to [facility] Main Entrance "A" at 06:00 A.M., then check in with the Admitting office.  Call this number if you have problems the morning of surgery:  [phone]   If you have any questions prior to your surgery date call [phone]: Open Monday-Friday 8am-4pm    Remember:  Do not eat or drink after midnight the night before your surgery      Take these medicines the morning of surgery with A SIP OF WATER  albuterol (VENTOLIN HFA) 108 (90 Base) inhaler if needed (bring inhaler with you the day of surgery) ALPRAZolam Prudy Feeler) if needed Eye drops diphenhydrAMINE (BENADRYL) if needed HYDROcodone-acetaminophen (NORCO) if needed simvastatin (ZOCOR)    As of today, STOP taking any Aspirin (unless otherwise instructed by your surgeon) Aleve, Naproxen, Ibuprofen, Motrin, Advil, Goody's, BC's, all herbal medications, fish oil, diclofenac Sodium (VOLTAREN) gel and all vitamins.          Do not wear jewelry or makeup Do not wear lotions, powders, perfumes/colognes, or deodorant. Do not shave 48 hours prior to surgery.   Do not bring valuables to the hospital.  DO Not wear nail polish, gel polish, artificial nails, or any other type of covering on natural nails  including finger and toenails. If patients have artificial nails, gel coating, etc. that need to be removed by a nail salon please have this removed prior to surgery or surgery may need to be canceled/delayed if the surgeon/ anesthesia feels like the patient is unable to be adequately monitored.             [facility] is not responsible for any belongings or valuables.  Do NOT Smoke (Tobacco/Vaping) or drink Alcohol 24 hours prior to your procedure If you use a CPAP at night, you may bring all equipment for your overnight stay.   Contacts, glasses, dentures or bridgework may not be worn into surgery, please bring cases for these belongings   For  patients admitted to the hospital, discharge time will be determined by your treatment team.   Patients discharged the day of surgery will not be allowed to drive home, and someone needs to stay with them for 24 hours.  ONLY 1 SUPPORT PERSON MAY BE PRESENT WHILE YOU ARE IN SURGERY. IF YOU ARE TO BE ADMITTED ONCE YOU ARE IN YOUR ROOM YOU WILL BE ALLOWED TWO (2) VISITORS.  Minor children may have two parents present. Special consideration for safety and communication needs will be reviewed on a case by case basis.  Special instructions:    Oral Hygiene is also important to reduce your risk of infection.  Remember - BRUSH YOUR TEETH THE MORNING OF SURGERY WITH YOUR REGULAR TOOTHPASTE   [place]- Preparing For Surgery  Before surgery, you can play an important role. Because skin is not sterile, your skin needs to be as free of germs as possible. You can reduce the number of germs on your skin by washing with CHG (chlorahexidine gluconate) Soap before surgery.  CHG is an antiseptic cleaner which kills germs and bonds with the skin to continue killing germs even after washing.     Please do not use if you have an allergy to CHG or antibacterial soaps. If your skin becomes reddened/irritated stop using the CHG.  Do not shave (including legs and underarms) for at least 48 hours prior to first CHG shower. It is OK to shave your face.  Please follow  these instructions carefully.     Shower the NIGHT BEFORE SURGERY and the MORNING OF SURGERY with CHG Soap.   If you chose to wash your hair, wash your hair first as usual with your normal shampoo. After you shampoo, rinse your hair and body thoroughly to remove the shampoo.  Then [employer] and genitals (private parts) with your normal soap and rinse thoroughly to remove soap.  After that Use CHG Soap as you would any other liquid soap. You can apply CHG directly to the skin and wash gently with a scrungie or a clean washcloth.   Apply the CHG Soap to  your body ONLY FROM THE NECK DOWN.  Do not use on open wounds or open sores. Avoid contact with your eyes, ears, mouth and genitals (private parts). Wash Face and genitals (private parts)  with your normal soap.   Wash thoroughly, paying special attention to the area where your surgery will be performed.  Thoroughly rinse your body with warm water from the neck down.  DO NOT shower/wash with your normal soap after using and rinsing off the CHG Soap.  Pat yourself dry with a CLEAN TOWEL.  Wear CLEAN PAJAMAS to bed the night before surgery  Place CLEAN SHEETS on your bed the night before your surgery  DO NOT SLEEP WITH PETS.   Day of Surgery: Take a shower with CHG soap. Wear Clean/Comfortable clothing the morning of surgery Do not apply any deodorants/lotions.   Remember to brush your teeth WITH YOUR REGULAR TOOTHPASTE.   Please read over the following fact sheets that you were given.

## 2020-11-17 ENCOUNTER — Encounter (HOSPITAL_COMMUNITY): Payer: Self-pay | Admitting: Physician Assistant

## 2020-11-17 ENCOUNTER — Encounter (HOSPITAL_COMMUNITY): Payer: Self-pay

## 2020-11-17 ENCOUNTER — Encounter (HOSPITAL_COMMUNITY)
Admission: RE | Admit: 2020-11-17 | Discharge: 2020-11-17 | Disposition: A | Discharge status: Home / Self Care | Payer: Medicaid Other | Source: Ambulatory Visit | Attending: Neurosurgery | Admitting: Neurosurgery

## 2020-11-17 ENCOUNTER — Other Ambulatory Visit: Payer: Self-pay

## 2020-11-17 DIAGNOSIS — Z20822 Contact with and (suspected) exposure to covid-19: Secondary | ICD-10-CM | POA: Insufficient documentation

## 2020-11-17 DIAGNOSIS — Z8673 Personal history of transient ischemic attack (TIA), and cerebral infarction without residual deficits: Secondary | ICD-10-CM | POA: Insufficient documentation

## 2020-11-17 DIAGNOSIS — I10 Essential (primary) hypertension: Secondary | ICD-10-CM | POA: Insufficient documentation

## 2020-11-17 DIAGNOSIS — F17200 Nicotine dependence, unspecified, uncomplicated: Secondary | ICD-10-CM | POA: Insufficient documentation

## 2020-11-17 DIAGNOSIS — Z01812 Encounter for preprocedural laboratory examination: Secondary | ICD-10-CM | POA: Diagnosis not present

## 2020-11-17 LAB — COMPREHENSIVE METABOLIC PANEL
ALT: 21 U/L (ref 0–44)
AST: 18 U/L (ref 15–41)
Albumin: 3.7 g/dL (ref 3.5–5.0)
Alkaline Phosphatase: 58 U/L (ref 38–126)
Anion gap: 9 (ref 5–15)
BUN: 9 mg/dL (ref 8–23)
CO2: 24 mmol/L (ref 22–32)
Calcium: 9 mg/dL (ref 8.9–10.3)
Chloride: 105 mmol/L (ref 98–111)
Creatinine, Ser: 0.8 mg/dL (ref 0.44–1.00)
GFR, Estimated: >60 mL/min (ref >60)
Glucose, Bld: 103 mg/dL — ABNORMAL HIGH (ref 70–99)
Potassium: 3.5 mmol/L (ref 3.5–5.1)
Sodium: 138 mmol/L (ref 135–145)
Total Bilirubin: 0.7 mg/dL (ref 0.3–1.2)
Total Protein: 6.4 g/dL — ABNORMAL LOW (ref 6.5–8.1)

## 2020-11-17 LAB — CBC WITH DIFFERENTIAL/PLATELET
Abs Immature Granulocytes: 0.03 10*3/uL (ref 0.00–0.07)
Basophils Absolute: 0.1 10*3/uL (ref 0.0–0.1)
Basophils Relative: 1 %
Eosinophils Absolute: 0 10*3/uL (ref 0.0–0.5)
Eosinophils Relative: 0 %
HCT: 47.1 % — ABNORMAL HIGH (ref 36.0–46.0)
Hemoglobin: 15.9 g/dL — ABNORMAL HIGH (ref 12.0–15.0)
Immature Granulocytes: 0 %
Lymphocytes Relative: 27 %
Lymphs Abs: 2 10*3/uL (ref 0.7–4.0)
MCH: 31.5 pg (ref 26.0–34.0)
MCHC: 33.8 g/dL (ref 30.0–36.0)
MCV: 93.5 fL (ref 80.0–100.0)
Monocytes Absolute: 0.4 10*3/uL (ref 0.1–1.0)
Monocytes Relative: 5 %
Neutro Abs: 4.9 10*3/uL (ref 1.7–7.7)
Neutrophils Relative %: 67 %
Platelets: 321 10*3/uL (ref 150–400)
RBC: 5.04 MIL/uL (ref 3.87–5.11)
RDW: 13.8 % (ref 11.5–15.5)
WBC: 7.4 10*3/uL (ref 4.0–10.5)
nRBC: 0 % (ref 0.0–0.2)

## 2020-11-17 LAB — SURGICAL PCR SCREEN
MRSA, PCR: POSITIVE — AB
Staphylococcus aureus: POSITIVE — AB

## 2020-11-17 LAB — TYPE AND SCREEN
ABO/RH(D): AB POS
Antibody Screen: NEGATIVE

## 2020-11-17 LAB — SARS CORONAVIRUS 2 (TAT 6-24 HRS): SARS Coronavirus 2: NEGATIVE

## 2020-11-17 NOTE — Pre-Procedure Instructions (Signed)
Surgical Instructions    Your procedure is scheduled on Monday, August 8th.  Report to [facility] Main Entrance "A" at 06:00 A.M., then check in with the Admitting office.  Call this number if you have problems the morning of surgery:  [phone]   If you have any questions prior to your surgery date call [phone]: Open Monday-Friday 8am-4pm    Remember:  Do not eat or drink after midnight the night before your surgery    Take these medicines the morning of surgery with A SIP OF WATER  simvastatin (ZOCOR)   Take these medications as needed: albuterol (VENTOLIN HFA) -bring inhaler with you the day of surgery ALPRAZolam Prudy Feeler) Eye drops diphenhydrAMINE (BENADRYL) HYDROcodone-acetaminophen (NOR[place])  Follow your surgeon's instructions on when to stop Aspirin.  If no instructions were given by your surgeon then you will need to call the office to get those instructions.    As of today, STOP taking any Aleve, Naproxen, Ibuprofen, Motrin, Advil, Goody's, BC's, all herbal medications, fish oil, and all vitamins. This includes:  diclofenac Sodium (VOLTAREN) gel.             Do NOT Smoke (Tobacco/Vaping) or drink Alcohol 24 hours prior to your procedure.  If you use a CPAP at night, you may bring all equipment for your overnight stay.   Contacts, glasses, piercing's, hearing aid's, dentures or partials may not be worn into surgery, please bring cases for these belongings.    For patients admitted to the hospital, discharge time will be determined by your treatment team.   Patients discharged the day of surgery will not be allowed to drive home, and someone needs to stay with them for 24 hours.  ONLY 1 SUPPORT PERSON MAY BE PRESENT WHILE YOU ARE IN SURGERY. IF YOU ARE TO BE ADMITTED ONCE YOU ARE IN YOUR ROOM YOU WILL BE ALLOWED TWO (2) VISITORS.  Minor children may have two parents present. Special consideration for safety and communication needs will be reviewed on a case by case  basis.   Special instructions:   [place]- Preparing For Surgery  Before surgery, you can play an important role. Because skin is not sterile, your skin needs to be as free of germs as possible. You can reduce the number of germs on your skin by washing with CHG (chlorahexidine gluconate) Soap before surgery.  CHG is an antiseptic cleaner which kills germs and bonds with the skin to continue killing germs even after washing.    Oral Hygiene is also important to reduce your risk of infection.  Remember - BRUSH YOUR TEETH THE MORNING OF SURGERY WITH YOUR REGULAR TOOTHPASTE  Please do not use if you have an allergy to CHG or antibacterial soaps. If your skin becomes reddened/irritated stop using the CHG.  Do not shave (including legs and underarms) for at least 48 hours prior to first CHG shower. It is OK to shave your face.  Please follow these instructions carefully.   Shower the NIGHT BEFORE SURGERY and the MORNING OF SURGERY  If you chose to wash your hair, wash your hair first as usual with your normal shampoo.  After you shampoo, rinse your hair and body thoroughly to remove the shampoo.  Use CHG Soap as you would any other liquid soap. You can apply CHG directly to the skin and wash gently with a scrungie or a clean washcloth.   Apply the CHG Soap to your body ONLY FROM THE NECK DOWN.  Do not use on  open wounds or open sores. Avoid contact with your eyes, ears, mouth and genitals (private parts). Wash Face and genitals (private parts)  with your normal soap.   Wash thoroughly, paying special attention to the area where your surgery will be performed.  Thoroughly rinse your body with warm water from the neck down.  DO NOT shower/wash with your normal soap after using and rinsing off the CHG Soap.  Pat yourself dry with a CLEAN TOWEL.  Wear CLEAN PAJAMAS to bed the night before surgery  Place CLEAN SHEETS on your bed the night before your surgery  DO NOT SLEEP WITH  PETS.   Day of Surgery: Shower with CHG soap. Do not wear jewelry, make up, nail polish, gel polish, artificial nails, or any other type of covering on natural nails including finger and toenails. If patients have artificial nails, gel coating, etc. that need to be removed by a nail salon please have this removed prior to surgery. Surgery may need to be canceled/delayed if the surgeon/ anesthesia feels like the patient is unable to be adequately monitored. Do not wear lotions, powders, perfumes, or deodorant. Do not shave 48 hours prior to surgery. Do not bring valuables to the hospital. [facility] is not responsible for any belongings or valuables. Wear Clean/Comfortable clothing the morning of surgery Remember to brush your teeth WITH YOUR REGULAR TOOTHPASTE.   Please read over the following fact sheets that you were given.

## 2020-11-17 NOTE — Progress Notes (Signed)
PCP - Dr. Janace Litten Cardiologist - denies  Chest x-ray - n/a EKG - 04/01/20 Stress Test - denies ECHO - denies Cardiac Cath - denies  Sleep Study - denies CPAP - denies  Blood Thinner Instructions: n/a Aspirin Instructions:Pt took a dose today 8/4. Pt advised to not take anymore until after surgery.  COVID TEST- 11/17/20; done in PAT.  Anesthesia review: Yes, BP readings high in PAT. 196/119, 192/111, and then 183/108 at the end of appt. Antionette Poles, PA-C spoke with pt during appt. Last office note has been requested from Dr. Mikeal Hawthorne. Pt also instructed to reach out to Dr. Mikeal Hawthorne regarding a resolution prior to surgery on Monday. Pt given BP readings to report. Pt checks BP on a wrist cuff at home but it is extremely inaccurate.  Patient denies shortness of breath, fever, cough and chest pain at PAT appointment   All instructions explained to the patient, with a verbal understanding of the material. Patient agrees to go over the instructions while at home for a better understanding. Patient also instructed to self quarantine after being tested for COVID-19. The opportunity to ask questions was provided.

## 2020-11-18 NOTE — Progress Notes (Signed)
Requested for patient to arrive at 1200 on DOS.  Patient stated that her ride was scheduled for 0830 but she would try to get it rescheduled.  Informed patient that if she could not get it rescheduled that she could still come to pre-op at 0830.  Update: patient returned call to nurse and stated that she could not go all day without eating, that she would pass out.  Patient stated that she was going to call Dr. Lindalou Hose office.

## 2020-11-18 NOTE — Progress Notes (Signed)
Anesthesia Chart Review:  Markedly uncontrolled hypertension noted at preadmission testing appointment.  Initially 196/119 and on recheck 183/108.  This has been a longstanding problem for this patient.  I previously spoke with this patient in January 2022 at a prior preop visit about this same issue.  She subsequently did not have surgery, reports that it was postponed at that time due to prevalence of COVID in the hospital.  She reports compliance with her lisinopril, hydrochlorothiazide, amlodipine.  However, she reports she uses a wrist cuff at home to monitor her blood pressure and frequently gets systolics over 200.  It is unclear whether she has discussed this with her primary care physician Dr. Vedia Pereyra.  Unfortunately, she has not been seen by Dr. Mikeal Hawthorne in person in over a year.  Her last appointment in July 2022 with a telehealth visit and blood pressure was not discussed in the assessment and plan at that time.  It was noted that she was awaiting surgery to address lumbar spine issues.  I attempted to contact Dr. Mikeal Hawthorne at [facility], but he was not in the office and I was told he is not scheduled to be back in the office anytime soon.  Long discussion again with patient about importance of hypertension management.  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  She relies on transportation services to get to her appointments.  She seems to understand that her blood pressure needs to be under better control and she is at high risk for complications related to uncontrolled hypertension.  I recommended she call her primary care physician's office today after leaving this appointment to report her blood pressure readings and request advice on management prior to upcoming surgery.  She verbalized that she would do this.  History of EtOH abuse, reportedly drinking 60 ounces of beer daily.   History of cocaine abuse, positive on UDS in October 2020 when admitted for CVA.  Current smoker.  I called and spoke with Erie Noe, Dr. Lindalou Hose surgical scheduler, to review patient's uncontrolled hypertension.  She is extremely high risk of being canceled on day of surgery.  She will relay this message to Dr. Jordan Likes who ultimately decide whether or not he wants to attempt to proceed.  Reviewed patient history with Dr. Hart Rochester.  He recommended that the best case would be for Dr. Jordan Likes to have the patient preadmitted prior to surgery so that she could be better optimized.  Dr. Jordan Likes elected to proceed as planned with the patient arriving on day of surgery.  We will have patient arrive early so that there is sufficient time for her to be adequately assessed by anesthesia on day of surgery.  Preop labs reviewed, unremarkable.  EKG 03/31/20: Sinus rhythm. Rate 82. Probable left atrial enlargement. LVH with secondary repolarization abnormality. Anterior Q waves, possibly due to LVH. No significant change since last tracing  TTE 01/17/2019:  1. Left ventricular ejection fraction, by visual estimation, is 55 to  60%. The left ventricle has normal function. Normal left ventricular size.  There is mildly increased left ventricular hypertrophy.   2. Left ventricular diastolic Doppler parameters are consistent with  impaired relaxation pattern of LV diastolic filling.   3. Global right ventricle has normal systolic function.The right  ventricular size is normal. No increase in right ventricular wall  thickness.   4. Left atrial size was upper normal.   5. Right atrial  size was normal.   6. Mild aortic valve annular calcification.   7. Mild mitral annular calcification.   8. The mitral valve is grossly normal. Trace mitral valve regurgitation.   9. The tricuspid valve is grossly normal. Tricuspid valve regurgitation  is trivial.  10. The aortic valve is tricuspid Aortic valve regurgitation was not   visualized by color flow Doppler.  11. The pulmonic valve was not well visualized. Pulmonic valve  regurgitation is not visualized by color flow Doppler.  12. TR signal is inadequate for assessing pulmonary artery systolic  pressure.  13. The interatrial septum was not well visualized.    Zannie Cove [facility] Stay [facility]/Anesthesiology Phone [phone] 11/18/2020 12:15 PM

## 2020-11-18 NOTE — Anesthesia Preprocedure Evaluation (Deleted)
Anesthesia Evaluation    Airway        Dental   Pulmonary Current Smoker,           Cardiovascular hypertension,      Neuro/Psych    GI/Hepatic   Endo/Other    Renal/GU      Musculoskeletal   Abdominal   Peds  Hematology   Anesthesia Other Findings   Reproductive/Obstetrics                             Anesthesia Physical Anesthesia Plan  ASA:   Anesthesia Plan:    Post-op Pain Management:    Induction:   PONV Risk Score and Plan:   Airway Management Planned:   Additional Equipment:   Intra-op Plan:   Post-operative Plan:   Informed Consent:   Plan Discussed with:   Anesthesia Plan Comments: (See PAT note by Ryane Konieczny, PA-C )        Anesthesia Quick Evaluation  

## 2020-11-29 ENCOUNTER — Encounter (HOSPITAL_COMMUNITY): Admission: RE | Payer: Self-pay | Source: Home / Self Care

## 2020-11-29 ENCOUNTER — Inpatient Hospital Stay (HOSPITAL_COMMUNITY): Admission: RE | Admit: 2020-11-29 | Payer: Medicaid Other | Source: Home / Self Care | Admitting: Neurosurgery

## 2020-11-29 SURGERY — POSTERIOR LUMBAR FUSION 2 LEVEL
Anesthesia: General | Site: Back

## 2020-11-29 NOTE — Progress Notes (Addendum)
Patient called to say that she is in too much pain and unable to move. Also, she states her blood pressure is elevated at 178/118, and she feels she will be cancelled like the previous 2 times for her blood pressure being elevated. Informed the patient that the OR desk will be notified and that she needs to contact Dr. Lindalou Hose office to make him aware. Patient advised to go to the emergency room for help, but she declined, saying, "I know this is the pain that I needed fixed in my back and I will take my medicine."

## 2021-07-07 ENCOUNTER — Ambulatory Visit: Payer: Medicaid Other | Admitting: Family Medicine

## 2021-07-14 ENCOUNTER — Ambulatory Visit: Payer: Medicaid Other | Admitting: Family Medicine

## 2022-01-16 ENCOUNTER — Other Ambulatory Visit: Payer: Self-pay | Admitting: Neurosurgery

## 2022-01-16 DIAGNOSIS — M4310 Spondylolisthesis, site unspecified: Secondary | ICD-10-CM

## 2022-01-30 ENCOUNTER — Other Ambulatory Visit: Payer: Self-pay | Admitting: Obstetrics and Gynecology

## 2022-01-30 DIAGNOSIS — Z1231 Encounter for screening mammogram for malignant neoplasm of breast: Secondary | ICD-10-CM

## 2022-02-15 ENCOUNTER — Other Ambulatory Visit: Payer: Medicaid Other

## 2022-02-26 ENCOUNTER — Other Ambulatory Visit: Payer: Medicaid Other

## 2022-02-27 ENCOUNTER — Ambulatory Visit
Admission: RE | Admit: 2022-02-27 | Discharge: 2022-02-27 | Disposition: A | Discharge status: Home / Self Care | Payer: Medicaid Other | Source: Ambulatory Visit | Attending: Neurosurgery | Admitting: Neurosurgery

## 2022-02-27 DIAGNOSIS — M4310 Spondylolisthesis, site unspecified: Secondary | ICD-10-CM

## 2022-03-19 ENCOUNTER — Ambulatory Visit: Payer: Medicaid Other

## 2022-05-11 ENCOUNTER — Other Ambulatory Visit: Payer: Self-pay | Admitting: Neurosurgery

## 2022-06-18 NOTE — Progress Notes (Signed)
Surgical Instructions    Your procedure is scheduled on Friday, June 22, 2022.  Report to [facility] Main Entrance "A" at 6:00 A.M., then check in with the Admitting office.  Call this number if you have problems the morning of surgery:  [phone]   If you have any questions prior to your surgery date call [phone]: Open Monday-Friday 8am-4pm If you experience any cold or flu symptoms such as cough, fever, chills, shortness of breath, etc. between now and your scheduled surgery, please notify us at the above number     Remember:  Do not eat or drink after midnight the night before your surgery    Take these medicines the morning of surgery with A SIP OF WATER:  Carboxymethylcellulose Sodium (ARTIFICIAL TEARS OP)  simvastatin (ZOCOR)    As needed:  albuterol (VENTOLIN HFA)  ALPRAZolam (XANAX)  diphenhydrAMINE (BENADRYL)  HYDROcodone-acetaminophen ([place])     Follow your surgeon's instructions on when to stop Aspirin.  If no instructions were given by your surgeon then you will need to call the office to get those instructions.     As of today, STOP taking any Aleve, Naproxen, Ibuprofen, Motrin, Advil, Goody's, BC's, all herbal medications, fish oil, and all vitamins.  This includes your diclofenac Sodium (VOLTAREN).            Do not wear jewelry or makeup. Do not wear lotions, powders, perfumes  or deodorant. Do not shave 48 hours prior to surgery.   Do not bring valuables to the hospital. Do not wear nail polish, gel polish, artificial nails, or any other type of covering on natural nails (fingers and toes) If you have artificial nails or gel coating that need to be removed by a nail salon, please have this removed prior to surgery. Artificial nails or gel coating may interfere with anesthesia's ability to adequately monitor your vital signs.  [place] is not responsible for any belongings or valuables.    Do NOT Smoke (Tobacco/Vaping)  24 hours prior to your  procedure  If you use a CPAP at night, you may bring your mask for your overnight stay.   Contacts, glasses, hearing aids, dentures or partials may not be worn into surgery, please bring cases for these belongings   For patients admitted to the hospital, discharge time will be determined by your treatment team.   Patients discharged the day of surgery will not be allowed to drive home, and someone needs to stay with them for 24 hours.   SURGICAL WAITING ROOM VISITATION Patients having surgery or a procedure may have no more than 2 support people in the waiting area - these visitors may rotate.   Children under the age of 47 must have an adult with them who is not the patient. If the patient needs to stay at the hospital during part of their recovery, the visitor guidelines for inpatient rooms apply. Pre-op nurse will coordinate an appropriate time for 1 support person to accompany patient in pre-op.  This support person may not rotate.   Please refer to [email or web address] for the visitor guidelines for Inpatients (after your surgery is over and you are in a regular room).    Special instructions:    Oral Hygiene is also important to reduce your risk of infection.  Remember - BRUSH YOUR TEETH THE MORNING OF SURGERY WITH YOUR REGULAR TOOTHPASTE   [place]- Preparing For Surgery  Before surgery, you can play an important role. Because skin is not sterile, your  skin needs to be as free of germs as possible. You can reduce the number of germs on your skin by washing with CHG (chlorahexidine gluconate) Soap before surgery.  CHG is an antiseptic cleaner which kills germs and bonds with the skin to continue killing germs even after washing.     Please do not use if you have an allergy to CHG or antibacterial soaps. If your skin becomes reddened/irritated stop using the CHG.  Do not shave (including legs and underarms) for at least 48 hours  prior to first CHG shower. It is OK to shave your face.  Please follow these instructions carefully.     Shower the NIGHT BEFORE SURGERY and the MORNING OF SURGERY with CHG Soap.   If you chose to wash your hair, wash your hair first as usual with your normal shampoo. After you shampoo, rinse your hair and body thoroughly to remove the shampoo.  Then [employer] and genitals (private parts) with your normal soap and rinse thoroughly to remove soap.  After that Use CHG Soap as you would any other liquid soap. You can apply CHG directly to the skin and wash gently with a scrungie or a clean washcloth.   Apply the CHG Soap to your body ONLY FROM THE NECK DOWN.  Do not use on open wounds or open sores. Avoid contact with your eyes, ears, mouth and genitals (private parts). Wash Face and genitals (private parts)  with your normal soap.   Wash thoroughly, paying special attention to the area where your surgery will be performed.  Thoroughly rinse your body with warm water from the neck down.  DO NOT shower/wash with your normal soap after using and rinsing off the CHG Soap.  Pat yourself dry with a CLEAN TOWEL.  Wear CLEAN PAJAMAS to bed the night before surgery  Place CLEAN SHEETS on your bed the night before your surgery  DO NOT SLEEP WITH PETS.   Day of Surgery:  Take a shower with CHG soap. Wear Clean/Comfortable clothing the morning of surgery Do not apply any deodorants/lotions.   Remember to brush your teeth WITH YOUR REGULAR TOOTHPASTE.    If you received a COVID test during your pre-op visit, it is requested that you wear a mask when out in public, stay away from anyone that may not be feeling well, and notify your surgeon if you develop symptoms. If you have been in contact with anyone that has tested positive in the last 10 days, please notify your surgeon.    Please read over the following fact sheets that you were given.

## 2022-06-19 ENCOUNTER — Inpatient Hospital Stay (HOSPITAL_COMMUNITY)
Admission: RE | Admit: 2022-06-19 | Discharge: 2022-06-19 | Disposition: A | Discharge status: Home / Self Care | Payer: Medicaid Other | Source: Ambulatory Visit

## 2022-06-20 NOTE — Progress Notes (Signed)
Surgical Instructions    Your procedure is scheduled on Friday, June 22, 2022.  Report to [facility] Main Entrance "A" at 7:45 A.M., then check in with the Admitting office.  Call this number if you have problems the morning of surgery:  [phone]   If you have any questions prior to your surgery date call [phone]: Open Monday-Friday 8am-4pm If you experience any cold or flu symptoms such as cough, fever, chills, shortness of breath, etc. between now and your scheduled surgery, please notify us at the above number     Remember:  Do not eat or drink after midnight the night before your surgery    Take these medicines the morning of surgery with A SIP OF WATER:  Carboxymethylcellulose Sodium (ARTIFICIAL TEARS OP)  simvastatin (ZOCOR)    As needed:  albuterol (VENTOLIN HFA)- if needed, bring inhaler with you on day of surgery ALPRAZolam (XANAX)  diphenhydrAMINE (BENADRYL)  HYDROcodone-acetaminophen ([place])     Follow your surgeon's instructions on when to stop Aspirin.  If no instructions were given by your surgeon then you will need to call the office to get those instructions.     As of today, STOP taking any Aleve, Naproxen, Ibuprofen, Motrin, Advil, Goody's, BC's, all herbal medications, fish oil, and all vitamins.  This includes your diclofenac Sodium (VOLTAREN) gel.            Do not wear jewelry or makeup. Do not wear lotions, powders, perfumes  or deodorant. Do not shave 48 hours prior to surgery.   Do not bring valuables to the hospital. Do not wear nail polish, gel polish, artificial nails, or any other type of covering on natural nails (fingers and toes) If you have artificial nails or gel coating that need to be removed by a nail salon, please have this removed prior to surgery. Artificial nails or gel coating may interfere with anesthesia's ability to adequately monitor your vital signs.  [place] is not responsible for any belongings or valuables.     Do NOT Smoke (Tobacco/Vaping)  24 hours prior to your procedure  If you use a CPAP at night, you may bring your mask for your overnight stay.   Contacts, glasses, hearing aids, dentures or partials may not be worn into surgery, please bring cases for these belongings   For patients admitted to the hospital, discharge time will be determined by your treatment team.   Patients discharged the day of surgery will not be allowed to drive home, and someone needs to stay with them for 24 hours.   SURGICAL WAITING ROOM VISITATION Patients having surgery or a procedure may have no more than 2 support people in the waiting area - these visitors may rotate.   Children under the age of 2 must have an adult with them who is not the patient. If the patient needs to stay at the hospital during part of their recovery, the visitor guidelines for inpatient rooms apply. Pre-op nurse will coordinate an appropriate time for 1 support person to accompany patient in pre-op.  This support person may not rotate.   Please refer to [email or web address] for the visitor guidelines for Inpatients (after your surgery is over and you are in a regular room).    Special instructions:    Oral Hygiene is also important to reduce your risk of infection.  Remember - BRUSH YOUR TEETH THE MORNING OF SURGERY WITH YOUR REGULAR TOOTHPASTE   [place]- Preparing For Surgery  Before surgery, you can  play an important role. Because skin is not sterile, your skin needs to be as free of germs as possible. You can reduce the number of germs on your skin by washing with CHG (chlorahexidine gluconate) Soap before surgery.  CHG is an antiseptic cleaner which kills germs and bonds with the skin to continue killing germs even after washing.     Please do not use if you have an allergy to CHG or antibacterial soaps. If your skin becomes reddened/irritated stop using the CHG.  Do not  shave (including legs and underarms) for at least 48 hours prior to first CHG shower. It is OK to shave your face.  Please follow these instructions carefully.     Shower the NIGHT BEFORE SURGERY and the MORNING OF SURGERY with CHG Soap.   If you chose to wash your hair, wash your hair first as usual with your normal shampoo. After you shampoo, rinse your hair and body thoroughly to remove the shampoo.  Then [employer] and genitals (private parts) with your normal soap and rinse thoroughly to remove soap.  After that Use CHG Soap as you would any other liquid soap. You can apply CHG directly to the skin and wash gently with a scrungie or a clean washcloth.   Apply the CHG Soap to your body ONLY FROM THE NECK DOWN.  Do not use on open wounds or open sores. Avoid contact with your eyes, ears, mouth and genitals (private parts). Wash Face and genitals (private parts)  with your normal soap.   Wash thoroughly, paying special attention to the area where your surgery will be performed.  Thoroughly rinse your body with warm water from the neck down.  DO NOT shower/wash with your normal soap after using and rinsing off the CHG Soap.  Pat yourself dry with a CLEAN TOWEL.  Wear CLEAN PAJAMAS to bed the night before surgery  Place CLEAN SHEETS on your bed the night before your surgery  DO NOT SLEEP WITH PETS.   Day of Surgery:  Take a shower with CHG soap. Wear Clean/Comfortable clothing the morning of surgery Do not apply any deodorants/lotions.   Remember to brush your teeth WITH YOUR REGULAR TOOTHPASTE.    If you received a COVID test during your pre-op visit, it is requested that you wear a mask when out in public, stay away from anyone that may not be feeling well, and notify your surgeon if you develop symptoms. If you have been in contact with anyone that has tested positive in the last 10 days, please notify your surgeon.    Please read over the following fact sheets that you were  given.

## 2022-06-21 ENCOUNTER — Inpatient Hospital Stay (HOSPITAL_COMMUNITY)
Admission: RE | Admit: 2022-06-21 | Discharge: 2022-06-21 | Disposition: A | Discharge status: Home / Self Care | Payer: Medicaid Other | Source: Ambulatory Visit

## 2022-07-20 NOTE — Pre-Procedure Instructions (Signed)
Surgical Instructions    Your procedure is scheduled on Friday, July 27, 2022 at 8:00 AM.  Report to [facility] Main Entrance "A" at 6:00 A.M., then check in with the Admitting office.  Call this number if you have problems the morning of surgery:  [phone]   If you have any questions prior to your surgery date call [phone]: Open Monday-Friday 8am-4pm  *If you experience any cold or flu symptoms such as cough, fever, chills, shortness of breath, etc. between now and your scheduled surgery, please notify us.*    Remember:  Do not eat or drink after midnight the night before your surgery    Take these medicines the morning of surgery with A SIP OF WATER:  Carboxymethylcellulose Sodium (ARTIFICIAL TEARS OP)  simvastatin (ZOCOR)   IF NEEDED: albuterol (VENTOLIN HFA) - Bring inhaler with you day of surgery. ALPRAZolam (XANAX)  diphenhydrAMINE (BENADRYL)  HYDROcodone-acetaminophen (NORCO)   Follow your surgeon's instructions on when to stop Aspirin.  If no instructions were given by your surgeon then you will need to call the office to get those instructions.    As of today, STOP taking any Aleve, Naproxen, Ibuprofen, Motrin, Advil, Goody's, BC's, all herbal medications, fish oil, and all vitamins. This inculdes your diclofenac Sodium (VOLTAREN) 1 % GEL.                     Do NOT Smoke (Tobacco/Vaping) for 24 hours prior to your procedure.  If you use a CPAP at night, you may bring your mask/headgear for your overnight stay.   Contacts, glasses, piercing's, hearing aid's, dentures or partials may not be worn into surgery, please bring cases for these belongings.    For patients admitted to the hospital, discharge time will be determined by your treatment team.   Patients discharged the day of surgery will not be allowed to drive home, and someone needs to stay with them for 24 hours.  SURGICAL WAITING ROOM VISITATION Patients having surgery or a procedure may have 2  support people in the waiting area. Visitors may stay in the waiting area during the procedure and switch out with other visitors if needed. Only 1 support person is allowed in the pre-op area with the patient AFTER the patient is prepped. This person cannot be switched out.  Children under the age of 75 must have an adult accompany them who is not the patient. If the patient needs to stay at the hospital during part of their recovery, the visitor guidelines for inpatient rooms apply.  Please refer to the [facility] website for the visitor guidelines for Inpatients (after your surgery is over and you are in a regular room).    Oral Hygiene is also important to reduce your risk of infection.  Remember - BRUSH YOUR TEETH THE MORNING OF SURGERY WITH YOUR REGULAR TOOTHPASTE  *PLEASE SEE ATTACHED SHEET FOR CHG SOAP INSTRUCTIONS*  Day of Surgery: Take a shower with CHG soap. Do not wear lotions, powders, perfumes/colognes, or deodorant. Do not wear jewelry or makeup Do not shave 48 hours prior to surgery. Do not wear nail polish, gel polish, artificial nails, or any other type of covering on natural nails (fingers and toes) If you have artificial nails or gel coating that need to be removed by a nail salon, please have this removed prior to surgery. Artificial nails or gel coating may interfere with anesthesia's ability to adequately monitor your vital signs. Wear Clean/Comfortable clothing the morning of surgery  Do not bring valuables to the hospital.  [facility] is not responsible for any belongings or valuables. Remember to brush your teeth WITH YOUR REGULAR TOOTHPASTE.   Please read over the following fact sheets that you were given.  If you received a COVID test during your pre-op visit  it is requested that you wear a mask when out in public, stay away from anyone that may not be feeling well and notify your surgeon if you develop symptoms. If you have been in contact with anyone that has  tested positive in the last 10 days please notify you surgeon.

## 2022-07-23 ENCOUNTER — Inpatient Hospital Stay (HOSPITAL_COMMUNITY)
Admission: RE | Admit: 2022-07-23 | Discharge: 2022-07-23 | Disposition: A | Discharge status: Home / Self Care | Payer: Medicaid Other | Source: Ambulatory Visit

## 2022-07-24 ENCOUNTER — Encounter (HOSPITAL_COMMUNITY): Payer: Self-pay

## 2022-07-24 ENCOUNTER — Other Ambulatory Visit: Payer: Self-pay

## 2022-07-24 ENCOUNTER — Encounter (HOSPITAL_COMMUNITY)
Admission: RE | Admit: 2022-07-24 | Discharge: 2022-07-24 | Disposition: A | Discharge status: Home / Self Care | Payer: Medicaid Other | Source: Ambulatory Visit | Attending: Neurosurgery | Admitting: Neurosurgery

## 2022-07-24 VITALS — BP 168/94 | HR 67 | Temp 98.1°F | Resp 18 | Ht 65.0 in | Wt 152.0 lb

## 2022-07-24 DIAGNOSIS — I10 Essential (primary) hypertension: Secondary | ICD-10-CM | POA: Insufficient documentation

## 2022-07-24 DIAGNOSIS — Z01818 Encounter for other preprocedural examination: Secondary | ICD-10-CM | POA: Insufficient documentation

## 2022-07-24 DIAGNOSIS — F1010 Alcohol abuse, uncomplicated: Secondary | ICD-10-CM | POA: Diagnosis not present

## 2022-07-24 LAB — CBC
HCT: 48.6 % — ABNORMAL HIGH (ref 36.0–46.0)
Hemoglobin: 16.4 g/dL — ABNORMAL HIGH (ref 12.0–15.0)
MCH: 31.8 pg (ref 26.0–34.0)
MCHC: 33.7 g/dL (ref 30.0–36.0)
MCV: 94.2 fL (ref 80.0–100.0)
Platelets: 308 10*3/uL (ref 150–400)
RBC: 5.16 MIL/uL — ABNORMAL HIGH (ref 3.87–5.11)
RDW: 14.2 % (ref 11.5–15.5)
WBC: 6.2 10*3/uL (ref 4.0–10.5)
nRBC: 0 % (ref 0.0–0.2)

## 2022-07-24 LAB — COMPREHENSIVE METABOLIC PANEL
ALT: 15 U/L (ref 0–44)
AST: 13 U/L — ABNORMAL LOW (ref 15–41)
Albumin: 3.6 g/dL (ref 3.5–5.0)
Alkaline Phosphatase: 67 U/L (ref 38–126)
Anion gap: 8 (ref 5–15)
BUN: 17 mg/dL (ref 8–23)
CO2: 24 mmol/L (ref 22–32)
Calcium: 8.8 mg/dL — ABNORMAL LOW (ref 8.9–10.3)
Chloride: 107 mmol/L (ref 98–111)
Creatinine, Ser: 0.83 mg/dL (ref 0.44–1.00)
GFR, Estimated: >60 mL/min (ref >60)
Glucose, Bld: 101 mg/dL — ABNORMAL HIGH (ref 70–99)
Potassium: 3.9 mmol/L (ref 3.5–5.1)
Sodium: 139 mmol/L (ref 135–145)
Total Bilirubin: 0.4 mg/dL (ref 0.3–1.2)
Total Protein: 6.5 g/dL (ref 6.5–8.1)

## 2022-07-24 LAB — SURGICAL PCR SCREEN
MRSA, PCR: POSITIVE — AB
Staphylococcus aureus: POSITIVE — AB

## 2022-07-24 LAB — TYPE AND SCREEN
ABO/RH(D): AB POS
Antibody Screen: NEGATIVE

## 2022-07-24 NOTE — Progress Notes (Signed)
PCP - Latina Craver, MD Cardiologist - Denies  PPM/ICD - Denies  Chest x-ray - Denies EKG - 07/24/2022 Stress Test - Denies ECHO - 01/17/2019 Cardiac Cath - Denies  Sleep Study - Denies  DM: Denies  Blood Thinner Instructions: N/A Aspirin Instructions: Patient instructed to hold Aspirin 7 days Prior to surgery, per surgeons office.  ERAS Protcol - NPO PRE-SURGERY Ensure or G2- N/A  COVID TEST- N/A   Anesthesia review: Yes, Cardiac history  Patient denies shortness of breath, fever, cough and chest pain at PAT appointment   All instructions explained to the patient, with a verbal understanding of the material. Patient agrees to go over the instructions while at home for a better understanding.The opportunity to ask questions was provided.

## 2022-07-24 NOTE — Progress Notes (Signed)
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 Surgeon scheduler called and notified.

## 2022-07-24 NOTE — Progress Notes (Signed)
During PAT appointment, Patient states she is unable to remain NPO due to swallowing issues in the morning. Patient states if she does not eat anything in the morning she will choke. Patient instructed to remain NPO after midnight.

## 2022-07-27 ENCOUNTER — Ambulatory Visit (HOSPITAL_BASED_OUTPATIENT_CLINIC_OR_DEPARTMENT_OTHER): Payer: Medicaid Other | Admitting: Certified Registered Nurse Anesthetist

## 2022-07-27 ENCOUNTER — Other Ambulatory Visit: Payer: Self-pay

## 2022-07-27 ENCOUNTER — Ambulatory Visit (HOSPITAL_COMMUNITY): Payer: Medicaid Other | Admitting: Emergency Medicine

## 2022-07-27 ENCOUNTER — Ambulatory Visit (HOSPITAL_COMMUNITY): Payer: Medicaid Other

## 2022-07-27 ENCOUNTER — Encounter (HOSPITAL_COMMUNITY): Payer: Self-pay | Admitting: Neurosurgery

## 2022-07-27 ENCOUNTER — Observation Stay (HOSPITAL_COMMUNITY)
Admission: RE | Admit: 2022-07-27 | Discharge: 2022-07-28 | Disposition: A | Discharge status: Home / Self Care | Payer: Medicaid Other | Attending: Neurosurgery | Admitting: Neurosurgery

## 2022-07-27 ENCOUNTER — Encounter (HOSPITAL_COMMUNITY)
Admission: RE | Disposition: A | Discharge status: Home / Self Care | Payer: Self-pay | Source: Home / Self Care | Attending: Neurosurgery

## 2022-07-27 DIAGNOSIS — Z79899 Other long term (current) drug therapy: Secondary | ICD-10-CM | POA: Insufficient documentation

## 2022-07-27 DIAGNOSIS — M4316 Spondylolisthesis, lumbar region: Secondary | ICD-10-CM | POA: Diagnosis present

## 2022-07-27 DIAGNOSIS — J45909 Unspecified asthma, uncomplicated: Secondary | ICD-10-CM | POA: Insufficient documentation

## 2022-07-27 DIAGNOSIS — F17210 Nicotine dependence, cigarettes, uncomplicated: Secondary | ICD-10-CM

## 2022-07-27 DIAGNOSIS — M4310 Spondylolisthesis, site unspecified: Secondary | ICD-10-CM | POA: Diagnosis present

## 2022-07-27 DIAGNOSIS — M5416 Radiculopathy, lumbar region: Secondary | ICD-10-CM | POA: Insufficient documentation

## 2022-07-27 DIAGNOSIS — M48061 Spinal stenosis, lumbar region without neurogenic claudication: Secondary | ICD-10-CM

## 2022-07-27 DIAGNOSIS — Z8673 Personal history of transient ischemic attack (TIA), and cerebral infarction without residual deficits: Secondary | ICD-10-CM | POA: Diagnosis not present

## 2022-07-27 DIAGNOSIS — I10 Essential (primary) hypertension: Secondary | ICD-10-CM | POA: Diagnosis not present

## 2022-07-27 DIAGNOSIS — Z7982 Long term (current) use of aspirin: Secondary | ICD-10-CM | POA: Insufficient documentation

## 2022-07-27 SURGERY — POSTERIOR LUMBAR FUSION 1 LEVEL
Anesthesia: General | Site: Back

## 2022-07-27 MED ORDER — LACTATED RINGERS IV SOLN: INTRAVENOUS | Status: DC

## 2022-07-27 MED ORDER — VITAMIN D 25 MCG (1000 UNIT) PO TABS
1000.0000 [IU] | ORAL_TABLET | Freq: Every day | ORAL | Status: DC
  Administered 2022-07-27 – 2022-07-28 (×2): 1000 [IU] via ORAL
  Filled 2022-07-27 (×2): qty 1

## 2022-07-27 MED ORDER — VITAMIN E 180 MG (400 UNIT) PO CAPS
400.0000 [IU] | ORAL_CAPSULE | Freq: Every day | ORAL | Status: DC
  Administered 2022-07-27: 400 [IU] via ORAL
  Filled 2022-07-27 (×2): qty 1

## 2022-07-27 MED ORDER — ALPRAZOLAM 0.5 MG PO TABS: 1.0000 mg | ORAL_TABLET | Freq: Two times a day (BID) | ORAL | Status: DC | PRN

## 2022-07-27 MED ORDER — FENTANYL CITRATE (PF) 250 MCG/5ML IJ SOLN
INTRAMUSCULAR | Status: DC | PRN
  Administered 2022-07-27: 50 ug via INTRAVENOUS
  Administered 2022-07-27: 25 ug via INTRAVENOUS
  Administered 2022-07-27: 100 ug via INTRAVENOUS
  Administered 2022-07-27: 50 ug via INTRAVENOUS
  Administered 2022-07-27: 25 ug via INTRAVENOUS

## 2022-07-27 MED ORDER — BUPIVACAINE HCL (PF) 0.25 % IJ SOLN
INTRAMUSCULAR | Status: AC
  Filled 2022-07-27: qty 30

## 2022-07-27 MED ORDER — FENTANYL CITRATE (PF) 250 MCG/5ML IJ SOLN
INTRAMUSCULAR | Status: AC
  Filled 2022-07-27: qty 5

## 2022-07-27 MED ORDER — VITAMIN C 500 MG PO TABS
500.0000 mg | ORAL_TABLET | Freq: Every day | ORAL | Status: DC
  Administered 2022-07-27 – 2022-07-28 (×2): 500 mg via ORAL
  Filled 2022-07-27 (×2): qty 1

## 2022-07-27 MED ORDER — CHLORHEXIDINE GLUCONATE CLOTH 2 % EX PADS: 6.0000 | MEDICATED_PAD | Freq: Once | CUTANEOUS | Status: DC

## 2022-07-27 MED ORDER — THROMBIN 20000 UNITS EX SOLR: CUTANEOUS | Status: DC | PRN

## 2022-07-27 MED ORDER — VANCOMYCIN HCL 1000 MG IV SOLR
INTRAVENOUS | Status: AC
  Filled 2022-07-27: qty 20

## 2022-07-27 MED ORDER — DEXAMETHASONE SODIUM PHOSPHATE 10 MG/ML IJ SOLN
INTRAMUSCULAR | Status: DC | PRN
  Administered 2022-07-27: 10 mg via INTRAVENOUS

## 2022-07-27 MED ORDER — CEFAZOLIN SODIUM-DEXTROSE 1-4 GM/50ML-% IV SOLN
1.0000 g | Freq: Three times a day (TID) | INTRAVENOUS | Status: AC
  Administered 2022-07-27 (×2): 1 g via INTRAVENOUS
  Filled 2022-07-27 (×3): qty 50

## 2022-07-27 MED ORDER — OXYCODONE HCL 5 MG/5ML PO SOLN: 5.0000 mg | Freq: Once | ORAL | Status: DC | PRN

## 2022-07-27 MED ORDER — SODIUM CHLORIDE 0.9% FLUSH
3.0000 mL | Freq: Two times a day (BID) | INTRAVENOUS | Status: DC
  Administered 2022-07-27 – 2022-07-28 (×2): 3 mL via INTRAVENOUS

## 2022-07-27 MED ORDER — FENTANYL CITRATE (PF) 100 MCG/2ML IJ SOLN
25.0000 ug | INTRAMUSCULAR | Status: DC | PRN
  Administered 2022-07-27: 25 ug via INTRAVENOUS
  Administered 2022-07-27: 50 ug via INTRAVENOUS
  Administered 2022-07-27: 25 ug via INTRAVENOUS

## 2022-07-27 MED ORDER — ONDANSETRON HCL 4 MG/2ML IJ SOLN
INTRAMUSCULAR | Status: DC | PRN
  Administered 2022-07-27: 4 mg via INTRAVENOUS

## 2022-07-27 MED ORDER — CALCIUM CARBONATE 1250 (500 CA) MG PO TABS
1250.0000 mg | ORAL_TABLET | Freq: Every day | ORAL | Status: DC
  Administered 2022-07-28: 1250 mg via ORAL
  Filled 2022-07-27: qty 1

## 2022-07-27 MED ORDER — HYDROMORPHONE HCL 1 MG/ML IJ SOLN
INTRAMUSCULAR | Status: AC
  Filled 2022-07-27: qty 0.5

## 2022-07-27 MED ORDER — OMEGA-3-ACID ETHYL ESTERS 1 G PO CAPS
1.0000 g | ORAL_CAPSULE | Freq: Every day | ORAL | Status: DC
  Administered 2022-07-27: 1 g via ORAL
  Filled 2022-07-27 (×2): qty 1

## 2022-07-27 MED ORDER — CHLORHEXIDINE GLUCONATE 0.12 % MT SOLN
15.0000 mL | Freq: Once | OROMUCOSAL | Status: AC
  Administered 2022-07-27: 15 mL via OROMUCOSAL
  Filled 2022-07-27: qty 15

## 2022-07-27 MED ORDER — HYDROCHLOROTHIAZIDE 25 MG PO TABS
25.0000 mg | ORAL_TABLET | Freq: Every morning | ORAL | Status: DC
  Administered 2022-07-28: 25 mg via ORAL
  Filled 2022-07-27: qty 1

## 2022-07-27 MED ORDER — OXYCODONE HCL 5 MG PO TABS
10.0000 mg | ORAL_TABLET | ORAL | Status: DC | PRN
  Administered 2022-07-27 – 2022-07-28 (×4): 10 mg via ORAL
  Filled 2022-07-27 (×4): qty 2

## 2022-07-27 MED ORDER — HYDROCODONE-ACETAMINOPHEN 10-325 MG PO TABS
1.0000 | ORAL_TABLET | ORAL | Status: DC | PRN
  Administered 2022-07-27 – 2022-07-28 (×3): 1 via ORAL
  Filled 2022-07-27 (×3): qty 1

## 2022-07-27 MED ORDER — EPHEDRINE SULFATE-NACL 50-0.9 MG/10ML-% IV SOSY
PREFILLED_SYRINGE | INTRAVENOUS | Status: DC | PRN
  Administered 2022-07-27: 5 mg via INTRAVENOUS

## 2022-07-27 MED ORDER — PHENOL 1.4 % MT LIQD: 1.0000 | OROMUCOSAL | Status: DC | PRN

## 2022-07-27 MED ORDER — PROMETHAZINE HCL 25 MG/ML IJ SOLN: 6.2500 mg | INTRAMUSCULAR | Status: DC | PRN

## 2022-07-27 MED ORDER — SODIUM CHLORIDE 0.9% FLUSH: 3.0000 mL | INTRAVENOUS | Status: DC | PRN

## 2022-07-27 MED ORDER — ADULT MULTIVITAMIN W/MINERALS CH
1.0000 | ORAL_TABLET | Freq: Every day | ORAL | Status: DC
  Administered 2022-07-27 – 2022-07-28 (×2): 1 via ORAL
  Filled 2022-07-27 (×2): qty 1

## 2022-07-27 MED ORDER — 0.9 % SODIUM CHLORIDE (POUR BTL) OPTIME
TOPICAL | Status: DC | PRN
  Administered 2022-07-27: 1000 mL

## 2022-07-27 MED ORDER — SODIUM CHLORIDE 0.9 % IV SOLN
250.0000 mL | INTRAVENOUS | Status: DC
  Administered 2022-07-27: 250 mL via INTRAVENOUS

## 2022-07-27 MED ORDER — LIDOCAINE 2% (20 MG/ML) 5 ML SYRINGE
INTRAMUSCULAR | Status: DC | PRN
  Administered 2022-07-27: 60 mg via INTRAVENOUS

## 2022-07-27 MED ORDER — DIAZEPAM 5 MG PO TABS
5.0000 mg | ORAL_TABLET | Freq: Four times a day (QID) | ORAL | Status: DC | PRN
  Administered 2022-07-27: 5 mg via ORAL
  Filled 2022-07-27: qty 1

## 2022-07-27 MED ORDER — PROPOFOL 10 MG/ML IV BOLUS
INTRAVENOUS | Status: AC
  Filled 2022-07-27: qty 20

## 2022-07-27 MED ORDER — SIMVASTATIN 20 MG PO TABS
20.0000 mg | ORAL_TABLET | Freq: Every day | ORAL | Status: DC
  Administered 2022-07-27 – 2022-07-28 (×2): 20 mg via ORAL
  Filled 2022-07-27 (×2): qty 1

## 2022-07-27 MED ORDER — VANCOMYCIN HCL IN DEXTROSE 1-5 GM/200ML-% IV SOLN
1000.0000 mg | INTRAVENOUS | Status: DC
  Filled 2022-07-27: qty 200

## 2022-07-27 MED ORDER — ACETAMINOPHEN 10 MG/ML IV SOLN
1000.0000 mg | Freq: Once | INTRAVENOUS | Status: DC | PRN
  Administered 2022-07-27: 1000 mg via INTRAVENOUS

## 2022-07-27 MED ORDER — FLEET ENEMA 7-19 GM/118ML RE ENEM: 1.0000 | ENEMA | Freq: Once | RECTAL | Status: DC | PRN

## 2022-07-27 MED ORDER — BUPIVACAINE HCL (PF) 0.25 % IJ SOLN
INTRAMUSCULAR | Status: DC | PRN
  Administered 2022-07-27: 20 mL

## 2022-07-27 MED ORDER — SUGAMMADEX SODIUM 200 MG/2ML IV SOLN
INTRAVENOUS | Status: DC | PRN
  Administered 2022-07-27: 150 mg via INTRAVENOUS

## 2022-07-27 MED ORDER — HYDROMORPHONE HCL 1 MG/ML IJ SOLN
INTRAMUSCULAR | Status: DC | PRN
  Administered 2022-07-27: .5 mg via INTRAVENOUS

## 2022-07-27 MED ORDER — HYDROMORPHONE HCL 1 MG/ML IJ SOLN: 1.0000 mg | INTRAMUSCULAR | Status: DC | PRN

## 2022-07-27 MED ORDER — ASPIRIN 325 MG PO TABS
325.0000 mg | ORAL_TABLET | Freq: Every day | ORAL | Status: DC
  Administered 2022-07-27 – 2022-07-28 (×2): 325 mg via ORAL
  Filled 2022-07-27 (×2): qty 1

## 2022-07-27 MED ORDER — MIDAZOLAM HCL 2 MG/2ML IJ SOLN
INTRAMUSCULAR | Status: DC | PRN
  Administered 2022-07-27 (×2): 1 mg via INTRAVENOUS

## 2022-07-27 MED ORDER — THROMBIN 20000 UNITS EX SOLR
CUTANEOUS | Status: AC
  Filled 2022-07-27: qty 20000

## 2022-07-27 MED ORDER — POLYETHYLENE GLYCOL 3350 17 G PO PACK: 17.0000 g | PACK | Freq: Every day | ORAL | Status: DC | PRN

## 2022-07-27 MED ORDER — ORAL CARE MOUTH RINSE: 15.0000 mL | Freq: Once | OROMUCOSAL | Status: AC

## 2022-07-27 MED ORDER — VITAMIN B-12 1000 MCG PO TABS
1000.0000 ug | ORAL_TABLET | Freq: Every day | ORAL | Status: DC
  Administered 2022-07-27 – 2022-07-28 (×2): 1000 ug via ORAL
  Filled 2022-07-27 (×2): qty 1

## 2022-07-27 MED ORDER — ACETAMINOPHEN 650 MG RE SUPP: 650.0000 mg | RECTAL | Status: DC | PRN

## 2022-07-27 MED ORDER — POLYVINYL ALCOHOL 1.4 % OP SOLN
1.0000 [drp] | Freq: Two times a day (BID) | OPHTHALMIC | Status: DC
  Administered 2022-07-27: 1 [drp] via OPHTHALMIC
  Filled 2022-07-27: qty 15

## 2022-07-27 MED ORDER — BISACODYL 10 MG RE SUPP: 10.0000 mg | Freq: Every day | RECTAL | Status: DC | PRN

## 2022-07-27 MED ORDER — ACETAMINOPHEN 10 MG/ML IV SOLN
INTRAVENOUS | Status: AC
  Filled 2022-07-27: qty 100

## 2022-07-27 MED ORDER — ACETAMINOPHEN 325 MG PO TABS
650.0000 mg | ORAL_TABLET | ORAL | Status: DC | PRN
  Administered 2022-07-27: 650 mg via ORAL
  Filled 2022-07-27: qty 2

## 2022-07-27 MED ORDER — ALBUMIN HUMAN 5 % IV SOLN: INTRAVENOUS | Status: DC | PRN

## 2022-07-27 MED ORDER — DIPHENHYDRAMINE HCL 25 MG PO CAPS: 50.0000 mg | ORAL_CAPSULE | Freq: Two times a day (BID) | ORAL | Status: DC | PRN

## 2022-07-27 MED ORDER — MIDAZOLAM HCL 2 MG/2ML IJ SOLN
INTRAMUSCULAR | Status: AC
  Filled 2022-07-27: qty 2

## 2022-07-27 MED ORDER — OXYCODONE HCL 5 MG PO TABS: 5.0000 mg | ORAL_TABLET | Freq: Once | ORAL | Status: DC | PRN

## 2022-07-27 MED ORDER — AMISULPRIDE (ANTIEMETIC) 5 MG/2ML IV SOLN: 10.0000 mg | Freq: Once | INTRAVENOUS | Status: DC | PRN

## 2022-07-27 MED ORDER — AMLODIPINE BESYLATE 10 MG PO TABS
10.0000 mg | ORAL_TABLET | Freq: Every morning | ORAL | Status: DC
  Administered 2022-07-28: 10 mg via ORAL
  Filled 2022-07-27: qty 1

## 2022-07-27 MED ORDER — PHENYLEPHRINE HCL-NACL 20-0.9 MG/250ML-% IV SOLN
INTRAVENOUS | Status: DC | PRN
  Administered 2022-07-27: 15 ug/min via INTRAVENOUS

## 2022-07-27 MED ORDER — VANCOMYCIN HCL IN DEXTROSE 1-5 GM/200ML-% IV SOLN
1000.0000 mg | INTRAVENOUS | Status: AC
  Administered 2022-07-27: 1000 mg via INTRAVENOUS

## 2022-07-27 MED ORDER — ONDANSETRON HCL 4 MG/2ML IJ SOLN: 4.0000 mg | Freq: Four times a day (QID) | INTRAMUSCULAR | Status: DC | PRN

## 2022-07-27 MED ORDER — FERROUS SULFATE 325 (65 FE) MG PO TABS
325.0000 mg | ORAL_TABLET | Freq: Every day | ORAL | Status: DC
  Administered 2022-07-28: 325 mg via ORAL
  Filled 2022-07-27: qty 1

## 2022-07-27 MED ORDER — MENTHOL 3 MG MT LOZG: 1.0000 | LOZENGE | OROMUCOSAL | Status: DC | PRN

## 2022-07-27 MED ORDER — LISINOPRIL 20 MG PO TABS
40.0000 mg | ORAL_TABLET | Freq: Every morning | ORAL | Status: DC
  Administered 2022-07-28: 40 mg via ORAL
  Filled 2022-07-27: qty 2

## 2022-07-27 MED ORDER — ROCURONIUM BROMIDE 10 MG/ML (PF) SYRINGE
PREFILLED_SYRINGE | INTRAVENOUS | Status: DC | PRN
  Administered 2022-07-27: 60 mg via INTRAVENOUS

## 2022-07-27 MED ORDER — ALBUTEROL SULFATE (2.5 MG/3ML) 0.083% IN NEBU: 3.0000 mL | INHALATION_SOLUTION | Freq: Four times a day (QID) | RESPIRATORY_TRACT | Status: DC | PRN

## 2022-07-27 MED ORDER — PROPOFOL 10 MG/ML IV BOLUS
INTRAVENOUS | Status: DC | PRN
  Administered 2022-07-27: 50 mg via INTRAVENOUS
  Administered 2022-07-27: 150 mg via INTRAVENOUS

## 2022-07-27 MED ORDER — HYDRALAZINE HCL 20 MG/ML IJ SOLN
INTRAMUSCULAR | Status: DC | PRN
  Administered 2022-07-27 (×2): 5 mg via INTRAVENOUS

## 2022-07-27 MED ORDER — ONDANSETRON HCL 4 MG PO TABS: 4.0000 mg | ORAL_TABLET | Freq: Four times a day (QID) | ORAL | Status: DC | PRN

## 2022-07-27 MED ORDER — FENTANYL CITRATE (PF) 100 MCG/2ML IJ SOLN
INTRAMUSCULAR | Status: AC
  Filled 2022-07-27: qty 2

## 2022-07-27 MED ORDER — CEFAZOLIN SODIUM-DEXTROSE 2-4 GM/100ML-% IV SOLN
2.0000 g | INTRAVENOUS | Status: AC
  Administered 2022-07-27: 2 g via INTRAVENOUS
  Filled 2022-07-27: qty 100

## 2022-07-27 MED ORDER — VANCOMYCIN HCL 1000 MG IV SOLR
INTRAVENOUS | Status: DC | PRN
  Administered 2022-07-27: 1000 mg

## 2022-07-27 SURGICAL SUPPLY — 61 items
ADH SKN CLS APL DERMABOND .7 (GAUZE/BANDAGES/DRESSINGS) ×1
APL SKNCLS STERI-STRIP NONHPOA (GAUZE/BANDAGES/DRESSINGS) ×1
BAG COUNTER SPONGE SURGICOUNT (BAG) ×2 IMPLANT
BAG DECANTER FOR FLEXI CONT (MISCELLANEOUS) ×2 IMPLANT
BAG SPNG CNTER NS LX DISP (BAG) ×1
BENZOIN TINCTURE PRP APPL 2/3 (GAUZE/BANDAGES/DRESSINGS) ×2 IMPLANT
BLADE BONE MILL MEDIUM (MISCELLANEOUS) ×2 IMPLANT
BLADE CLIPPER SURG (BLADE) IMPLANT
BUR CUTTER 7.0 ROUND (BURR) ×2 IMPLANT
BUR MATCHSTICK NEURO 3.0 LAGG (BURR) ×2 IMPLANT
CAGE INTERBODY PL LG 7X26.5X24 (Cage) IMPLANT
CANISTER SUCT 3000ML PPV (MISCELLANEOUS) ×2 IMPLANT
CNTNR URN SCR LID CUP LEK RST (MISCELLANEOUS) ×2 IMPLANT
CONT SPEC 4OZ STRL OR WHT (MISCELLANEOUS) ×1
COVER BACK TABLE 60X90IN (DRAPES) ×2 IMPLANT
DERMABOND ADVANCED .7 DNX12 (GAUZE/BANDAGES/DRESSINGS) ×2 IMPLANT
DRAPE C-ARM 42X72 X-RAY (DRAPES) ×4 IMPLANT
DRAPE HALF SHEET 40X57 (DRAPES) IMPLANT
DRAPE LAPAROTOMY 100X72X124 (DRAPES) ×2 IMPLANT
DRAPE SURG 17X23 STRL (DRAPES) ×8 IMPLANT
DRSG OPSITE POSTOP 4X6 (GAUZE/BANDAGES/DRESSINGS) ×2 IMPLANT
DURAPREP 26ML APPLICATOR (WOUND CARE) ×2 IMPLANT
ELECT REM PT RETURN 9FT ADLT (ELECTROSURGICAL) ×1
ELECTRODE REM PT RTRN 9FT ADLT (ELECTROSURGICAL) ×2 IMPLANT
EVACUATOR 1/8 PVC DRAIN (DRAIN) IMPLANT
GAUZE 4X4 16PLY ~~LOC~~+RFID DBL (SPONGE) IMPLANT
GAUZE SPONGE 4X4 12PLY STRL (GAUZE/BANDAGES/DRESSINGS) IMPLANT
GLOVE BIO SURGEON STRL SZ 6.5 (GLOVE) ×2 IMPLANT
GLOVE BIOGEL PI IND STRL 6.5 (GLOVE) ×2 IMPLANT
GLOVE ECLIPSE 9.0 STRL (GLOVE) ×4 IMPLANT
GLOVE EXAM NITRILE XL STR (GLOVE) IMPLANT
GOWN STRL REUS W/ TWL LRG LVL3 (GOWN DISPOSABLE) IMPLANT
GOWN STRL REUS W/ TWL XL LVL3 (GOWN DISPOSABLE) ×4 IMPLANT
GOWN STRL REUS W/TWL 2XL LVL3 (GOWN DISPOSABLE) IMPLANT
GOWN STRL REUS W/TWL LRG LVL3 (GOWN DISPOSABLE)
GOWN STRL REUS W/TWL XL LVL3 (GOWN DISPOSABLE) ×2
KIT BASIN OR (CUSTOM PROCEDURE TRAY) ×2 IMPLANT
KIT TURNOVER KIT B (KITS) ×2 IMPLANT
NEEDLE HYPO 22GX1.5 SAFETY (NEEDLE) ×2 IMPLANT
NS IRRIG 1000ML POUR BTL (IV SOLUTION) ×2 IMPLANT
PACK LAMINECTOMY NEURO (CUSTOM PROCEDURE TRAY) ×2 IMPLANT
PUTTY GRAFTON DBF 6CC W/DELIVE (Putty) IMPLANT
ROD RADIUS 40MM (Neuro Prosthesis/Implant) ×2 IMPLANT
ROD SPNL 40X5.5XNS TI RDS (Neuro Prosthesis/Implant) IMPLANT
SCREW PA EVEREST 5.5X40 (Screw) IMPLANT
SCREW POLYAXIAL 5.5X45MM (Screw) IMPLANT
SET SCREW (Screw) ×4 IMPLANT
SET SCREW VRST (Screw) IMPLANT
SOL ELECTROSURG ANTI STICK (MISCELLANEOUS) ×1
SOLUTION ELECTROSURG ANTI STCK (MISCELLANEOUS) ×2 IMPLANT
SPIKE FLUID TRANSFER (MISCELLANEOUS) ×2 IMPLANT
SPONGE SURGIFOAM ABS GEL 100 (HEMOSTASIS) ×2 IMPLANT
STRIP CLOSURE SKIN 1/2X4 (GAUZE/BANDAGES/DRESSINGS) ×4 IMPLANT
SUT VIC AB 0 CT1 18XCR BRD8 (SUTURE) ×4 IMPLANT
SUT VIC AB 0 CT1 8-18 (SUTURE) ×2
SUT VIC AB 2-0 CT1 18 (SUTURE) ×2 IMPLANT
SUT VIC AB 3-0 SH 8-18 (SUTURE) ×4 IMPLANT
TOWEL GREEN STERILE (TOWEL DISPOSABLE) ×2 IMPLANT
TOWEL GREEN STERILE FF (TOWEL DISPOSABLE) ×2 IMPLANT
TRAY FOLEY MTR SLVR 16FR STAT (SET/KITS/TRAYS/PACK) ×2 IMPLANT
WATER STERILE IRR 1000ML POUR (IV SOLUTION) ×2 IMPLANT

## 2022-07-27 NOTE — Plan of Care (Signed)

## 2022-07-27 NOTE — Transfer of Care (Signed)
Immediate Anesthesia Transfer of Care Note  Patient: Katrina Schmidt  Procedure(s) Performed: Posterior Lumbar Interbody Fusion  - Lumbar four-Lumbar five (Back)  Patient Location: PACU  Anesthesia Type:General  Level of Consciousness: awake, alert , and oriented  Airway & Oxygen Therapy: Patient Spontanous Breathing  Post-op Assessment: Report given to RN and Post -op Vital signs reviewed and stable  Post vital signs: Reviewed and stable  Last Vitals:  Vitals Value Taken Time  BP 150/88 07/27/22 1045  Temp    Pulse 88 07/27/22 1047  Resp 20 07/27/22 1047  SpO2 96 % 07/27/22 1047  Vitals shown include unvalidated device data.  Last Pain:  Vitals:   07/27/22 0659  TempSrc:   PainSc: 10-Worst pain ever         Complications: No notable events documented.

## 2022-07-27 NOTE — H&P (Signed)
Katrina Schmidt is an 62 y.o. female.   Chief Complaint: Back pain HPI: Patient is 62 year old female with back and bilateral lower extremity pain numbness and weakness left greater than right.  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  Patient has failed conservative management presents now for decompression and fusion L4-5 in hopes of improving her symptoms.  Past Medical History:  Diagnosis Date   Anemia    Anxiety    Asthma    Bipolar disorder    Chronic bronchitis    DDD (degenerative disc disease), lumbosacral    Depression    DJD (degenerative joint disease)    Dyspnea    GERD (gastroesophageal reflux disease)    Headache    Hyperlipidemia    Hypertension    Pneumonia    Sciatica    Stroke    mini stroke 2021    Past Surgical History:  Procedure Laterality Date   HERNIA REPAIR      Family History  Problem Relation Age of Onset   Hypertension Mother    Social History:  reports that she has been smoking cigarettes. She has a 10.00 pack-year smoking history. She has never used smokeless tobacco. She reports current alcohol use. She reports current drug use. Drug: Cocaine.  Allergies:  Allergies  Allergen Reactions   Apple Juice Itching    Reaction to red apple (only tolerates granny apples)   Citrus Itching and Swelling    Tongue swells   Other Other (See Comments)    Plums, pineapple, red apple Unnamed blood thinner- Caused    Peach [Prunus Persica] Itching    Can eat canned peaches only   Pineapple Itching   Plum Pulp Itching   Strawberry Extract Itching    Medications Prior to Admission  Medication Sig Dispense Refill   albuterol (VENTOLIN HFA) 108 (90 Base) MCG/ACT inhaler Inhale 2 puffs into the lungs every 6 (six) hours as needed for wheezing or shortness of breath.     ALPRAZolam (XANAX) 0.5 MG tablet Take 1 mg by mouth 2 (two) times daily as needed (panic attacks).      aspirin 325 MG tablet  Take 325 mg by mouth daily.     Carboxymethylcellulose Sodium (ARTIFICIAL TEARS OP) Place 1 drop into both eyes 2 (two) times daily.     ferrous sulfate 325 (65 FE) MG tablet Take 325 mg by mouth daily with breakfast.     hydrochlorothiazide (HYDRODIURIL) 25 MG tablet Take 1 tablet (25 mg total) by mouth every morning.     HYDROcodone-acetaminophen (NORCO) 5-325 MG tablet Take 1 tablet by mouth every 6 (six) hours as needed for severe pain. 10 tablet 0   lisinopril (ZESTRIL) 40 MG tablet Take 1 tablet (40 mg total) by mouth every morning.     Multiple Vitamin (MULTIVITAMIN WITH MINERALS) TABS tablet Take 1 tablet by mouth daily.     simvastatin (ZOCOR) 20 MG tablet Take 20 mg by mouth daily.     amLODipine (NORVASC) 10 MG tablet Take 1 tablet (10 mg total) by mouth every morning. (Patient not taking: No sig reported)     ascorbic acid (VITAMIN C) 500 MG tablet Take 500 mg by mouth daily.     CALCIUM PO Take 1 tablet by mouth daily.     cholecalciferol (VITAMIN D) 25 MCG (1000 UNIT) tablet Take 1,000 Units by mouth daily.     CRANBERRY PO Take 1 tablet by mouth daily.     diclofenac  Sodium (VOLTAREN) 1 % GEL Apply 4 g topically 4 (four) times daily. (Patient taking differently: Apply 4 g topically 4 (four) times daily as needed (pain).) 100 g 0   diphenhydrAMINE (BENADRYL) 25 MG tablet Take 50 mg by mouth 2 (two) times daily as needed for allergies.     HYDROcodone-acetaminophen (NORCO) 10-325 MG tablet Take 1 tablet by mouth every 4 (four) hours as needed for severe pain.     Omega-3 Fatty Acids (FISH OIL) 1000 MG CAPS Take 1,000 mg by mouth daily.     vitamin B-12 (CYANOCOBALAMIN) 1000 MCG tablet Take 1,000 mcg by mouth daily.     Vitamin E 400 units TABS Take 400 Units by mouth daily.      No results found for this or any previous visit (from the past 48 hour(s)). No results found.  Pertinent items noted in HPI and remainder of comprehensive ROS otherwise negative.  Blood pressure (!)  192/103, pulse 61, temperature 98.6 F (37 C), temperature source Oral, resp. rate 18, height  (1.651 m), weight 68.9 kg, SpO2 95 %.  Patient is awake and alert.  She is oriented and appropriate.  Speech is fluent.  Judgment insight are intact.  Cranial nerve function normal bilateral.  Motor examination reveals intact motor strength bilaterally except she has weakness of dorsiflexion on the left lower extremity guarding at 4-/5.  Sensory examination reveals decreased sensation pinprick and light touch in her left L5 and S1 dermatomes.  Deep tendon rhexis normal active.  Except her Achilles reflexes are absent bilaterally.  Gait is antalgic.  Posture is mildly flexed.  Examination head ears eyes nose and throat is unremarkable her chest and abdomen are benign.  Extremities are free from injury or deformity. Assessment/Plan L4-5 degenerative spondylolisthesis with severe stenosis and ongoing radiculopathy and neurogenic claudication.  Plan bilateral L4-5 decompressive laminotomies and foraminotomies followed by posterior lumbar to body fusion utilizing interbody cages, local harvested autograft, and augmented with posterior lateral arthrodesis utilizing nonsegmental pedicle screw fixation and local autografting.  Risks and benefits of been explained.  Patient wishes to proceed.  Katrina Schmidt Katrina Schmidt 07/27/2022, 7:53 AM

## 2022-07-27 NOTE — Anesthesia Procedure Notes (Signed)
Procedure Name: Intubation Date/Time: 07/27/2022 8:33 AM  Performed by: Dairl Ponder, CRNAPre-anesthesia Checklist: Patient identified, Emergency Drugs available, Suction available and Patient being monitored Patient Re-evaluated:Patient Re-evaluated prior to induction Oxygen Delivery Method: Circle System Utilized Preoxygenation: Pre-oxygenation with 100% oxygen Induction Type: IV induction Ventilation: Mask ventilation without difficulty Laryngoscope Size: Mac and 3 Grade View: Grade I Tube type: Oral Tube size: 7.0 mm Number of attempts: 1 Airway Equipment and Method: Stylet and Oral airway Placement Confirmation: ETT inserted through vocal cords under direct vision, positive ETCO2 and breath sounds checked- equal and bilateral Secured at: 23 cm Tube secured with: Tape Dental Injury: Teeth and Oropharynx as per pre-operative assessment

## 2022-07-27 NOTE — Anesthesia Procedure Notes (Signed)
Arterial Line Insertion Start/End4/03/2023 8:39 AM Performed by: Dairl Ponder, CRNA, CRNA  Patient location: Pre-op. Preanesthetic checklist: patient identified, IV checked, site marked, risks and benefits discussed, surgical consent, monitors and equipment checked, pre-op evaluation, timeout performed and anesthesia consent Lidocaine 1% used for infiltration Right, radial was placed Catheter size: 20 G Hand hygiene performed  and maximum sterile barriers used   Attempts: 1 Procedure performed without using ultrasound guided technique. Following insertion, dressing applied. Post procedure assessment: normal and unchanged

## 2022-07-27 NOTE — Op Note (Signed)
Date of procedure: 07/27/2022  Date of dictation: Same  Service: Neurosurgery  Preoperative diagnosis: Grade 1 L4-5 degenerative spondylolisthesis with severe stenosis and radiculopathy  Postoperative diagnosis: Same  Procedure Name: Bilateral L4-5 decompressive laminotomies and foraminotomies, more than would be required for simple interbody fusion alone.  Bilateral L4-L5 ponte osteotomies for sagittal plane restoration  L4-5 posterior lumbar interbody fusion utilizing interbody cages, local harvested autograft, and morselized allograft  L4-5 posterior lateral arthrodesis utilizing nonsegmental pedicle screw fixation and local autografting.  Surgeon:Dejanique Ruehl A.Wasil Wolke, M.D.  Asst. Surgeon: Doran Durand, NP  Assistant utilized for exposure, decompression, fusion, instrumentation and closure.  Anesthesia: General  Indication: 62 year old female with severe back and bilateral lower extremity symptoms left greater than right.  Workup demonstrates evidence of grade 1 L4-5 degenerative spondylolisthesis with marked to space collapse and severe central and foraminal stenosis.  Patient also with a disc protrusion at L5-S1.  Plan is to only operate at the L4-5 level as we could not get approval for the L5-S1 level.  Operative note: After induction of anesthesia, patient positioned prone on the Wilson frame and properly padded.  Lumbar region prepped and draped sterilely.  Incision made overlying L4-5.  Dissection performed bilaterally.  Retractor placed.  Fluoroscopy used.  Levels confirmed.  Decompressive laminotomies and facetectomy was then performed using Leksell rongeurs, Kerrison under the high-speed drill to remove the inferior two thirds of the lamina of L4, the entire inferior facet and pars interarticularis of L4 bilaterally, the majority of the superior articular process of L5 bilaterally, and the superior aspect the L5 lamina.  Ligament flavum elevated and resected.  Foraminotomies completed on  the course exiting L4 and L5 nerve roots bilaterally.  Further lateral decompression and facetectomy was performed freeing the interspace completing a ponte osteotomy to allow for mobilization and sagittal plane restoration.  Disc base was then incised bilaterally.  Discectomy was then performed bilaterally.  Disc base then prepared for interbody fusion.  With the distractor placed the patient's right side disc base was further cleaned on the left side.  An 7 mm [employer] expandable cage was then impacted into place and expanded.  Distractors removed patient's right side.  The space prepared on the right side.  Morselized autograft packed in the interspace.  A second cage was then impacted in the place and expanded.  Pedicles of L4 and L5 were identified using surface landmarks and intraoperative fluoroscopy.  Superficial bone around the pedicle was then removed using high-speed drill.  Pedicle was then probed using a pedicle awl.  Each pedicle tract was then probed and found to be solidly within the bone.  Each pedicle tract was tapped with a screw tap.  Each screw tap hole was once again probed and confirmed to be in good position.  5.5 mm Everest screws from [employer] placed bilaterally at L4 and L5.  Final images revealed good position of the cages and the hardware at the proper operative level with normal alignment of the spine.  Each cage was then packed with demineralized bone fibers.  Transverse processes were decorticated and morselized autograft was packed posterior laterally.  Short segment of titanium rod placed over the screw heads at L4-5.  Locking caps placed over the screws and locking caps then engaged.  Vancomycin powder placed in the deep wound space.  Wounds and closed in layers with Vicryl sutures.  Steri-Strips and sterile dressing were applied.  No apparent complications.  Patient tolerated the procedure well and she returns to the recovery room  postop.

## 2022-07-27 NOTE — Anesthesia Preprocedure Evaluation (Addendum)
Anesthesia Evaluation  Patient identified by MRN, date of birth, ID band Patient awake    Reviewed: Allergy & Precautions, NPO status , Patient's Chart, lab work & pertinent test results  Airway Mallampati: II  TM Distance: >3 FB Neck ROM: Full    Dental  (+) Poor Dentition, Missing, Edentulous Upper   Pulmonary asthma , Current Smoker and Patient abstained from smoking.   Pulmonary exam normal breath sounds clear to auscultation       Cardiovascular hypertension, Pt. on medications Normal cardiovascular exam Rhythm:Regular Rate:Normal     Neuro/Psych  Headaches PSYCHIATRIC DISORDERS Anxiety Depression Bipolar Disorder   CVA    GI/Hepatic negative GI ROS,,,(+)     substance abuse    Endo/Other  negative endocrine ROS    Renal/GU negative Renal ROS     Musculoskeletal  (+) Arthritis ,    Abdominal   Peds  Hematology negative hematology ROS (+)   Anesthesia Other Findings   Reproductive/Obstetrics                             Anesthesia Physical Anesthesia Plan  ASA: 3  Anesthesia Plan: General   Post-op Pain Management:    Induction: Intravenous  PONV Risk Score and Plan: 2 and Ondansetron, Dexamethasone, Treatment may vary due to age or medical condition and Midazolam  Airway Management Planned: Oral ETT  Additional Equipment:   Intra-op Plan:   Post-operative Plan: Extubation in OR  Informed Consent: I have reviewed the patients History and Physical, chart, labs and discussed the procedure including the risks, benefits and alternatives for the proposed anesthesia with the patient or authorized representative who has indicated his/her understanding and acceptance.     Dental advisory given  Plan Discussed with: CRNA  Anesthesia Plan Comments:        Anesthesia Quick Evaluation

## 2022-07-27 NOTE — Plan of Care (Signed)
  Problem: Education: Goal: Knowledge of General Education information will improve Description: Including pain rating scale, medication(s)/side effects and non-pharmacologic comfort measures Outcome: Not Progressing   Problem: Health Behavior/Discharge Planning: Goal: Ability to manage health-related needs will improve Outcome: Not Progressing   Problem: Clinical Measurements: Goal: Ability to maintain clinical measurements within normal limits will improve Outcome: Not Progressing Goal: Will remain free from infection Outcome: Not Progressing Goal: Diagnostic test results will improve Outcome: Not Progressing Goal: Respiratory complications will improve Outcome: Not Progressing Goal: Cardiovascular complication will be avoided Outcome: Not Progressing   Problem: Activity: Goal: Risk for activity intolerance will decrease Outcome: Not Progressing   Problem: Nutrition: Goal: Adequate nutrition will be maintained Outcome: Not Progressing   Problem: Coping: Goal: Level of anxiety will decrease Outcome: Not Progressing   Problem: Elimination: Goal: Will not experience complications related to bowel motility Outcome: Not Progressing Goal: Will not experience complications related to urinary retention Outcome: Not Progressing   Problem: Pain Managment: Goal: General experience of comfort will improve Outcome: Not Progressing   Problem: Safety: Goal: Ability to remain free from injury will improve Outcome: Not Progressing   Problem: Skin Integrity: Goal: Risk for impaired skin integrity will decrease Outcome: Not Progressing   Problem: Education: Goal: Ability to verbalize activity precautions or restrictions will improve Outcome: Not Progressing Goal: Knowledge of the prescribed therapeutic regimen will improve Outcome: Not Progressing Goal: Understanding of discharge needs will improve Outcome: Not Progressing   Problem: Activity: Goal: Ability to avoid  complications of mobility impairment will improve Outcome: Not Progressing Goal: Ability to tolerate increased activity will improve Outcome: Not Progressing Goal: Will remain free from falls Outcome: Not Progressing   Problem: Bowel/Gastric: Goal: Gastrointestinal status for postoperative course will improve Outcome: Not Progressing   Problem: Clinical Measurements: Goal: Ability to maintain clinical measurements within normal limits will improve Outcome: Not Progressing Goal: Postoperative complications will be avoided or minimized Outcome: Not Progressing Goal: Diagnostic test results will improve Outcome: Not Progressing   Problem: Pain Management: Goal: Pain level will decrease Outcome: Not Progressing   Problem: Skin Integrity: Goal: Will show signs of wound healing Outcome: Not Progressing   Problem: Health Behavior/Discharge Planning: Goal: Identification of resources available to assist in meeting health care needs will improve Outcome: Not Progressing   Problem: Bladder/Genitourinary: Goal: Urinary functional status for postoperative course will improve Outcome: Not Progressing   

## 2022-07-27 NOTE — Brief Op Note (Signed)
07/27/2022  10:25 AM  PATIENT:  Katrina Schmidt  62 y.o. female  PRE-OPERATIVE DIAGNOSIS:  Spondylolisthesis  POST-OPERATIVE DIAGNOSIS:  Spondylolisthesis  PROCEDURE:  Procedure(s): Posterior Lumbar Interbody Fusion  - Lumbar four-Lumbar five (N/A)  SURGEON:  Surgeon(s) and Role:    Julio Sicks, MD - Primary  PHYSICIAN ASSISTANT:   ASSISTANTSMarland Mcalpine   ANESTHESIA:   general  EBL:  50 mL   BLOOD ADMINISTERED:none  DRAINS: none   LOCAL MEDICATIONS USED:  MARCAINE     SPECIMEN:  No Specimen  DISPOSITION OF SPECIMEN:  N/A  COUNTS:  YES  TOURNIQUET:  * No tourniquets in log *  DICTATION: .Dragon Dictation  PLAN OF CARE: Admit for overnight observation  PATIENT DISPOSITION:  PACU - hemodynamically stable.   Delay start of Pharmacological VTE agent (>24hrs) due to surgical blood loss or risk of bleeding: yes

## 2022-07-28 DIAGNOSIS — M4316 Spondylolisthesis, lumbar region: Secondary | ICD-10-CM | POA: Diagnosis not present

## 2022-07-28 MED ORDER — MUPIROCIN 2 % EX OINT
1.0000 | TOPICAL_OINTMENT | Freq: Two times a day (BID) | CUTANEOUS | Status: DC
  Filled 2022-07-28: qty 22

## 2022-07-28 MED ORDER — CHLORHEXIDINE GLUCONATE CLOTH 2 % EX PADS: 6.0000 | MEDICATED_PAD | Freq: Every day | CUTANEOUS | Status: DC

## 2022-07-28 MED ORDER — OXYCODONE HCL 10 MG PO TABS: 10.0000 mg | ORAL_TABLET | ORAL | 0 refills | Status: AC | PRN

## 2022-07-28 NOTE — TOC Transition Note (Addendum)
Transition of Care [facility]) - CM/SW Discharge Note   Patient Details  Name: Katrina Schmidt MRN: [id number] Date of Birth: December 02, 1960  Transition of Care [facility]) CM/SW Contact:  Lawerance Sabal, RN Phone Number: 07/28/2022, 10:54 AM   Clinical Narrative:     Spoke w patient over the phone, explained unable to set up [facility] services, she is agreeable to outpatient services along bus line.  Referral for PT made to [facility] street added to AVS. [employer] to deliver 3/1 to room prior to DC  Patient called stating she was discharged without her 3/1. She states her daughter can come to the hospital to pick it up. Provided her with number to call [facility] to arrange this and requested [employer] to come back to the hospital and deliver it again.  Unit to call family when it is delivered to the  nurse's station.    Barriers to Discharge: No Barriers Identified   Patient Goals and CMS Choice CMS Medicare.gov Compare Post Acute Care list provided to:: Patient Choice offered to / list presented to : Patient  Discharge Placement                         Discharge Plan and Services Additional resources added to the After Visit Summary for     Discharge Planning Services: CM Consult Post Acute Care Choice: Durable Medical Equipment          DME Arranged: Bedside commode DME Agency: [employer] Date DME Agency Contacted: 07/28/22 Time DME Agency Contacted: 1054 Representative spoke with at DME Agency: Vaughan Basta            Social Determinants of Health (SDOH) Interventions SDOH Screenings   Food Insecurity: Patient Unable To Answer (07/28/2022)  Transportation Needs: Patient Declined (07/28/2022)  Utilities: Not At Risk (07/28/2022)  Tobacco Use: High Risk (07/27/2022)     Readmission Risk Interventions     No data to display

## 2022-07-28 NOTE — Progress Notes (Signed)
Pt safely discharged. Discharge packet provided with teach-back method. VS wnL and as per flow. IV removed, Pt verbalized understanding. All questions and concerns addressed. No complaints of pain. All belongings packed up. Awaiting on daughter for transport.

## 2022-07-28 NOTE — Progress Notes (Signed)
Patient c/o pain all the time, asking to have her PRN pain medications "around the clock".

## 2022-07-28 NOTE — Progress Notes (Signed)
Foley Catheter discontinued by provider's order. Education was provided to this patient, she is due to void within the next 6 hours. Patient expressed understanding.

## 2022-07-28 NOTE — Anesthesia Postprocedure Evaluation (Signed)
Anesthesia Post Note  Patient: CARROLYN HIMELRIGHT  Procedure(s) Performed: Posterior Lumbar Interbody Fusion  - Lumbar four-Lumbar five (Back)     Patient location during evaluation: PACU Anesthesia Type: General Level of consciousness: awake Pain management: pain level controlled Vital Signs Assessment: post-procedure vital signs reviewed and stable Respiratory status: spontaneous breathing, nonlabored ventilation and respiratory function stable Cardiovascular status: blood pressure returned to baseline and stable Postop Assessment: no apparent nausea or vomiting Anesthetic complications: no   No notable events documented.  Last Vitals:  Vitals:   07/28/22 0039 07/28/22 0348  BP: (!) 140/81 (!) 126/59  Pulse: 85 71  Resp: 20 17  Temp: 37.3 C 37.3 C  SpO2: 99% 96%    Last Pain:  Vitals:   07/28/22 0348  TempSrc: Oral  PainSc:                  Ashawn Rinehart P Dustine Stickler

## 2022-07-28 NOTE — Progress Notes (Signed)
Patient refused to get her CHG bath.

## 2022-07-28 NOTE — Evaluation (Signed)
Physical Therapy Evaluation Patient Details Name: Katrina Schmidt MRN: [id number] DOB: 05/07/1960 Today's Date: 07/28/2022  History of Present Illness  Pt is a 62 y.o. female who presented 07/27/22 for PLIF L4-5. PMH: anemia, anxiety, asthma, bipolar disorder, DDD, DJD, depression, GERD, HLD, HTN, sciatica, CVA.   Clinical Impression  Pt presents with condition above and deficits mentioned below, see PT Problem List. At baseline, pt is mod I for functional mobility either intermittently holding onto furniture for support or using her rollator in the home, and pt also intermittently uses her HoverRound for longer distances or on bad days. Pt resides with family in a 1-level house with 1 STE and has an aide come 7 days/week. 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 Her R hip flexion weakness may be more related to her reported R hip pain this date though. Pt demonstrates a slow, antalgic, step-to gait pattern initially due to the pain but was able to progress to a step-through gait pattern as distance progressed. At this time, pt is benefiting from use of a RW to ambulate without LOB, thus educated pt to use her rollator vs RW at d/c. She verbalized understanding. She also reports numbness/tingling symmetrically in her bil lateral ankles and plantar and dorsal aspects of her feet and toes. Pt could benefit from further acute PT and follow-up with PT upon d/c home.     Recommendations for follow up therapy are one component of a multi-disciplinary discharge planning process, led by the attending physician.  Recommendations may be updated based on patient status, additional functional criteria and insurance authorization.  Follow Up Recommendations       Assistance Recommended at Discharge Intermittent Supervision/Assistance  Patient can return home with the following  A little help with bathing/dressing/bathroom;Assistance with  cooking/housework;Assist for transportation;Help with stairs or ramp for entrance    Equipment Recommendations BSC/3in1  Recommendations for Other Services       Functional Status Assessment Patient has had a recent decline in their functional status and demonstrates the ability to make significant improvements in function in a reasonable and predictable amount of time.     Precautions / Restrictions Precautions Precautions: Back Precaution Booklet Issued: Yes (comment) Precaution Comments: reviewed precautions Required Braces or Orthoses: Spinal Brace Spinal Brace: Applied in sitting position;Lumbar corset Restrictions Weight Bearing Restrictions: No      Mobility  Bed Mobility Overal bed mobility: Needs Assistance Bed Mobility: Sidelying to Sit, Rolling Rolling: Supervision Sidelying to sit: Supervision, HOB elevated       General bed mobility comments: Extra time and pt using bed rail to roll and transition to sit R EOB, no assistance needed, supervision for safety    Transfers Overall transfer level: Needs assistance Equipment used: Rolling walker (2 wheels) Transfers: Sit to/from Stand Sit to Stand: Min guard           General transfer comment: Cues to push up from EOB, min guard for safety, no LOB    Ambulation/Gait Ambulation/Gait assistance: Min guard Gait Distance (Feet): 230 Feet Assistive device: Rolling walker (2 wheels) Gait Pattern/deviations: Step-through pattern, Step-to pattern, Decreased stance time - right, Decreased stride length, Decreased step length - left, Antalgic Gait velocity: reduced Gait velocity interpretation: <1.8 ft/sec, indicate of risk for recurrent falls   General Gait Details: Pt initially with antalgic step-to gait pattern due to R hip pain, but eventually progressed to step-through gait pattern as distance  progressed but still mildly antalgic. No LOB, RW for support. Several steps without UE support with lateral trunk sway  noted, min guard, educated pt to use her RW vs rollator at d/c  Stairs            Wheelchair Mobility    Modified Rankin (Stroke Patients Only)       Balance Overall balance assessment: Needs assistance Sitting-balance support: No upper extremity supported, Feet supported Sitting balance-Leahy Scale: Good     Standing balance support: Bilateral upper extremity supported, During functional activity, No upper extremity supported Standing balance-Leahy Scale: Fair Standing balance comment: seeks external support intermittently. able to static stand with no AD. benefits from use of RW to ambulate                             Pertinent Vitals/Pain Pain Assessment Pain Assessment: Faces Faces Pain Scale: Hurts little more Pain Location: back, R hip Pain Descriptors / Indicators: Aching Pain Intervention(s): Limited activity within patient's tolerance, Monitored during session, Repositioned    Home Living Family/patient expects to be discharged to:: Private residence Living Arrangements: Spouse/significant other Available Help at Discharge: Family;Home health Type of Home: House Home Access: Stairs to enter   [employer] of Steps: 1 step   Home Layout: One level Home Equipment: Wheelchair - power;Shower seat;Cane - single point;Crutches;Rollator (4 wheels);Hand held shower head Additional Comments: Aide 7 days a week 2.5 hours. housecleaning, meal prep.    Prior Function Prior Level of Function : Needs assist             Mobility Comments: Pt reports using a HoverRound for longer distances and on bad days. Uses rollator vs furniture for external support to walk in her home. ADLs Comments: Baths and dresses herself. Aide vs family assist with meal prep, housekeeping, etc.     Hand Dominance        Extremity/Trunk Assessment   Upper Extremity Assessment Upper Extremity Assessment: Defer to OT evaluation    Lower Extremity  Assessment Lower Extremity Assessment: RLE deficits/detail;LLE deficits/detail RLE Deficits / Details: Weaker in R leg than L, MMT scores of 3 hip flexion R (4L), 4+ knee extension R (5L), 5 ankle dorsiflexion bil; numbness/tingling symmetrically bil at lateral aspect of ankle, dorsal and plantar aspect of foot and toes RLE Sensation: decreased light touch LLE Deficits / Details: MMT scores of 4 hip flexion, 5 knee extension, 5 ankle dorsiflexion; numbness/tingling symmetrically bil at lateral aspect of ankle, dorsal and plantar aspect of foot and toes LLE Sensation: decreased light touch    Cervical / Trunk Assessment Cervical / Trunk Assessment: Back Surgery  Communication   Communication: No difficulties  Cognition Arousal/Alertness: Awake/alert Behavior During Therapy: Anxious, WFL for tasks assessed/performed, Impulsive Overall Cognitive Status: Within Functional Limits for tasks assessed                                          General Comments      Exercises     Assessment/Plan    PT Assessment Patient needs continued PT services  PT Problem List Decreased strength;Decreased activity tolerance;Decreased balance;Decreased mobility;Impaired sensation;Pain       PT Treatment Interventions DME instruction;Gait training;Stair training;Functional mobility training;Therapeutic activities;Therapeutic exercise;Balance training;Neuromuscular re-education;Patient/family education    PT Goals (Current goals can be found in the Care Plan section)  Acute Rehab PT Goals Patient Stated Goal: to go home PT Goal Formulation: With patient Time For Goal Achievement: 08/11/22 Potential to Achieve Goals: Good    Frequency Min 5X/week     Co-evaluation               AM-PAC PT "6 Clicks" Mobility  Outcome Measure Help needed turning from your back to your side while in a flat bed without using bedrails?: A Little Help needed moving from lying on your back to  sitting on the side of a flat bed without using bedrails?: A Little Help needed moving to and from a bed to a chair (including a wheelchair)?: A Little Help needed standing up from a chair using your arms (e.g., wheelchair or bedside chair)?: A Little Help needed to walk in hospital room?: A Little Help needed climbing 3-5 steps with a railing? : A Little 6 Click Score: 18    End of Session Equipment Utilized During Treatment: Gait belt;Back brace Activity Tolerance: Patient tolerated treatment well Patient left: in bed;with call bell/phone within reach;with bed alarm set   PT Visit Diagnosis: Unsteadiness on feet (R26.81);Other abnormalities of gait and mobility (R26.89);Muscle weakness (generalized) (M62.81);Difficulty in walking, not elsewhere classified (R26.2);Pain Pain - Right/Left: Right Pain - part of body: Hip (back)    Time: 1610-9604 PT Time Calculation (min) (ACUTE ONLY): 14 min   Charges:   PT Evaluation $PT Eval Moderate Complexity: 1 Mod          Raymond Gurney, PT, DPT Acute Rehabilitation Services  Office: [phone]   Jewel Baize 07/28/2022, 10:21 AM

## 2022-07-28 NOTE — Progress Notes (Signed)
    Durable Medical Equipment  (From admission, onward)           Start     Ordered   07/28/22 1020  For home use only DME Bedside commode  Once       Question:  Patient needs a bedside commode to treat with the following condition  Answer:  Weakness   07/28/22 1020   07/28/22 1007  For home use only DME 3 n 1  Once       Comments: Pt has low toilet at home and currently on back precautions.   07/28/22 1012   07/27/22 1507  DME Walker rolling  Once       Question:  Patient needs a walker to treat with the following condition  Answer:  Degenerative spondylolisthesis   07/27/22 1506   07/27/22 1507  DME 3 n 1  Once        07/27/22 1506           Patient confined to one room, will need bedside commode to be able to toilet

## 2022-07-28 NOTE — Evaluation (Signed)
Occupational Therapy Evaluation Patient Details Name: Katrina Schmidt MRN: [id number] DOB: Nov 07, 1960 Today's Date: 07/28/2022   History of Present Illness Pt is a 62 y.o. female who presented 07/27/22 for PLIF L4-5. PMH: anemia, anxiety, asthma, bipolar disorder, DDD, DJD, depression, GERD, HLD, HTN, sciatica, CVA.   Clinical Impression   Pt admitted with the above diagnoses and presents with below problem list. Pt will benefit from continued acute OT to address the below listed deficits and maximize independence with basic ADLs prior to d/c home. At baseline, pt lives with family and has aide 2.5 hours 7 days a week. Family vs aide help with IADLS (meal prep, housekeeping, etc). Pt reports she is able to bath and dress herself. She endores use of electric wheelchair vs rollator vs no AD depending on distance and how she's feeling. Currently, pt is min guard with LB ADLs and functional transfers using rw. Back education provided as pertains to ADLs.        Recommendations for follow up therapy are one component of a multi-disciplinary discharge planning process, led by the attending physician.  Recommendations may be updated based on patient status, additional functional criteria and insurance authorization.   Assistance Recommended at Discharge Intermittent Supervision/Assistance  Patient can return home with the following Assistance with cooking/housework;A little help with walking and/or transfers;A little help with bathing/dressing/bathroom    Functional Status Assessment  Patient has had a recent decline in their functional status and demonstrates the ability to make significant improvements in function in a reasonable and predictable amount of time.  Equipment Recommendations  BSC/3in1    Recommendations for Other Services       Precautions / Restrictions Precautions Precautions: Back Precaution Booklet Issued: Yes (comment) Required Braces or Orthoses: Spinal Brace Spinal  Brace: Applied in sitting position;Lumbar corset Restrictions Weight Bearing Restrictions: No      Mobility Bed Mobility Overal bed mobility: Needs Assistance Bed Mobility: Sidelying to Sit, Sit to Supine   Sidelying to sit: Min guard   Sit to supine: Supervision   General bed mobility comments: To return to bed pt came from EOB to long sitting then lowered trunk. Discussed bed mobility techniques and reenforced back precaution education    Transfers Overall transfer level: Needs assistance Equipment used: Rolling walker (2 wheels) Transfers: Sit to/from Stand Sit to Stand: Min guard           General transfer comment: to/from EOB and regular height toilet. cues for technique with rw and back precautions      Balance Overall balance assessment: Needs assistance Sitting-balance support: No upper extremity supported, Feet supported Sitting balance-Leahy Scale: Good     Standing balance support: Bilateral upper extremity supported, During functional activity Standing balance-Leahy Scale: Fair Standing balance comment: seeks external support intermittently. able to static stand with no AD                           ADL either performed or assessed with clinical judgement   ADL Overall ADL's : Needs assistance/impaired Eating/Feeding: Set up;Sitting   Grooming: Set up;Sitting   Upper Body Bathing: Set up;Sitting;Cueing for compensatory techniques   Lower Body Bathing: Min guard;Sit to/from stand;Cueing for compensatory techniques   Upper Body Dressing : Set up;Cueing for compensatory techniques;Sitting   Lower Body Dressing: Min guard;Cueing for compensatory techniques;Sit to/from stand   Toilet Transfer: Min guard;Ambulation;Regular Toilet;Rolling walker (2 wheels)   Toileting- Clothing Manipulation and Hygiene: Min guard;Sit to/from stand  Tub/ Shower Transfer: Min guard;Ambulation;Shower seat;Rolling walker (2 wheels)   Functional mobility during  ADLs: Min guard;Rolling walker (2 wheels) General ADL Comments: Pt completed bed mobility, walked in the room, toilet transfer, and pericare. Educated on back precautions as pertains to ADLs     Vision         Perception     Praxis      Pertinent Vitals/Pain Pain Assessment Pain Assessment: Faces Faces Pain Scale: Hurts a little bit Pain Location: back Pain Descriptors / Indicators: Aching Pain Intervention(s): Limited activity within patient's tolerance, Monitored during session, Repositioned     Hand Dominance     Extremity/Trunk Assessment Upper Extremity Assessment Upper Extremity Assessment: Generalized weakness   Lower Extremity Assessment Lower Extremity Assessment: Defer to PT evaluation   Cervical / Trunk Assessment Cervical / Trunk Assessment: Back Surgery   Communication Communication Communication: No difficulties   Cognition Arousal/Alertness: Awake/alert Behavior During Therapy: Anxious, WFL for tasks assessed/performed, Impulsive Overall Cognitive Status: Within Functional Limits for tasks assessed                                       General Comments       Exercises     Shoulder Instructions      Home Living Family/patient expects to be discharged to:: Private residence Living Arrangements: Spouse/significant other Available Help at Discharge: Family;Home health Type of Home: House Home Access: Stairs to enter [employer] of Steps: 1 step   Home Layout: One level     Bathroom Shower/Tub: Chief Strategy Officer: Standard     Home Equipment: Wheelchair - power;Shower seat;Cane - single point;Crutches;Rollator (4 wheels);Hand held shower head   Additional Comments: Aide 7 days a week 2.5 hours. housecleaning, meal prep.      Prior Functioning/Environment Prior Level of Function : Needs assist             Mobility Comments: Pt reports using a HoverRound for longer distances and on bad  days. Uses rollator vs furniture for external support to walk in her home. ADLs Comments: Baths and dresses herself. Aide vs family assist with meal prep, housekeeping, etc.        OT Problem List: Impaired balance (sitting and/or standing);Decreased knowledge of precautions;Decreased knowledge of use of DME or AE;Pain      OT Treatment/Interventions: Self-care/ADL training;DME and/or AE instruction;Therapeutic activities;Balance training;Patient/family education    OT Goals(Current goals can be found in the care plan section) Acute Rehab OT Goals Patient Stated Goal: home ASAP OT Goal Formulation: With patient Time For Goal Achievement: 08/11/22 Potential to Achieve Goals: Good  OT Frequency: Min 2X/week    Co-evaluation              AM-PAC OT "6 Clicks" Daily Activity     Outcome Measure Help from another person eating meals?: None Help from another person taking care of personal grooming?: None Help from another person toileting, which includes using toliet, bedpan, or urinal?: None Help from another person bathing (including washing, rinsing, drying)?: A Little Help from another person to put on and taking off regular upper body clothing?: None Help from another person to put on and taking off regular lower body clothing?: None 6 Click Score: 23   End of Session Equipment Utilized During Treatment: Rolling walker (2 wheels);Back brace  Activity Tolerance: Patient tolerated treatment well Patient left: in bed;with call  bell/phone within reach;with bed alarm set  OT Visit Diagnosis: Unsteadiness on feet (R26.81);Pain                Time: 6295-2841 OT Time Calculation (min): 27 min Charges:  OT General Charges $OT Visit: 1 Visit OT Evaluation $OT Eval Low Complexity: 1 Low OT Treatments $Self Care/Home Management : 8-22 mins  Raynald Kemp, OT Acute Rehabilitation Services Office: [phone]   Pilar Grammes 07/28/2022, 9:56 AM

## 2022-07-28 NOTE — Discharge Summary (Signed)
Physician Discharge Summary  Patient ID: Katrina Schmidt MRN: [id number] DOB/AGE: 63/29/1962 63 y.o.  Admit date: 07/27/2022 Discharge date: 07/28/2022  Admission Diagnoses: Lumbar stenosis with instability   Discharge Diagnoses: Same   Discharged Condition: good  Hospital Course: The patient was admitted on 07/27/2022 and taken to the operating room where the patient underwent plif. The patient tolerated the procedure well and was taken to the recovery room and then to the floor in stable condition. The hospital course was routine. There were no complications. The wound remained clean dry and intact. Pt had appropriate back soreness. No complaints of leg pain or new N/T/W. The patient remained afebrile with stable vital signs, and tolerated a regular diet. The patient continued to increase activities, and pain was well controlled with oral pain medications.   Consults: None  Significant Diagnostic Studies:  Results for orders placed or performed during the hospital encounter of 07/24/22  Surgical pcr screen   Specimen: Nasal Mucosa; Nasal Swab  Result Value Ref Range   MRSA, PCR POSITIVE (A) NEGATIVE   Staphylococcus aureus POSITIVE (A) NEGATIVE  Comprehensive metabolic panel per protocol  Result Value Ref Range   Sodium 139 135 - 145 mmol/L   Potassium 3.9 3.5 - 5.1 mmol/L   Chloride 107 98 - 111 mmol/L   CO2 24 22 - 32 mmol/L   Glucose, Bld 101 (H) 70 - 99 mg/dL   BUN 17 8 - 23 mg/dL   Creatinine, Ser 4.09 0.44 - 1.00 mg/dL   Calcium 8.8 (L) 8.9 - 10.3 mg/dL   Total Protein 6.5 6.5 - 8.1 g/dL   Albumin 3.6 3.5 - 5.0 g/dL   AST 13 (L) 15 - 41 U/L   ALT 15 0 - 44 U/L   Alkaline Phosphatase 67 38 - 126 U/L   Total Bilirubin 0.4 0.3 - 1.2 mg/dL   GFR, Estimated >81 >19 mL/min   Anion gap 8 5 - 15  CBC per protocol  Result Value Ref Range   WBC 6.2 4.0 - 10.5 K/uL   RBC 5.16 (H) 3.87 - 5.11 MIL/uL   Hemoglobin 16.4 (H) 12.0 - 15.0 g/dL   HCT 14.7 (H) 82.9 - 56.2 %    MCV 94.2 80.0 - 100.0 fL   MCH 31.8 26.0 - 34.0 pg   MCHC 33.7 30.0 - 36.0 g/dL   RDW 13.0 86.5 - 78.4 %   Platelets 308 150 - 400 K/uL   nRBC 0.0 0.0 - 0.2 %  Type and screen  Result Value Ref Range   ABO/RH(D) AB POS    Antibody Screen NEG    Sample Expiration 08/07/2022,2359    Extend sample reason      NO TRANSFUSIONS OR PREGNANCY IN THE PAST 3 MONTHS Performed at [facility], [address] [place], [place] [address]     DG Lumbar Spine 2-3 Views  Result Date: 07/27/2022 CLINICAL DATA:  Fluoro guidance provided EXAM: LUMBAR SPINE - 2-3 VIEW FINDINGS: Dose: 28.9 mGy Fluoro time 36s IMPRESSION: C-arm fluoro guidance provided. Electronically Signed   By: Layla Maw M.D.   On: 07/27/2022 10:52   DG C-Arm 1-60 Min-No Report  Result Date: 07/27/2022 Fluoroscopy was utilized by the requesting physician.  No radiographic interpretation.   DG C-Arm 1-60 Min-No Report  Result Date: 07/27/2022 Fluoroscopy was utilized by the requesting physician.  No radiographic interpretation.    Antibiotics:  Anti-infectives (From admission, onward)    Start     Dose/Rate Route  Frequency Ordered Stop   07/27/22 1600  ceFAZolin (ANCEF) IVPB 1 g/50 mL premix        1 g 100 mL/hr over 30 Minutes Intravenous Every 8 hours 07/27/22 1506 07/27/22 2349   07/27/22 1019  vancomycin (VANCOCIN) powder  Status:  Discontinued          As needed 07/27/22 1020 07/27/22 1037   07/27/22 0730  vancomycin (VANCOCIN) IVPB 1000 mg/200 mL premix        1,000 mg 200 mL/hr over 60 Minutes Intravenous On call to O.R. 07/27/22 0726 07/27/22 1509   07/27/22 0700  vancomycin (VANCOCIN) IVPB 1000 mg/200 mL premix  Status:  Discontinued        1,000 mg 200 mL/hr over 60 Minutes Intravenous On call to O.R. 07/27/22 9163 07/27/22 0656   07/27/22 0700  ceFAZolin (ANCEF) IVPB 2g/100 mL premix        2 g 200 mL/hr over 30 Minutes Intravenous On call to O.R. 07/27/22 8466 07/27/22 0855       Discharge  Exam: Blood pressure (!) 146/82, pulse 80, temperature 98.6 F (37 C), temperature source Oral, resp. rate 17, height 5\' 5"  (1.651 m), weight 68.9 kg, SpO2 95 %. Neurologic: Grossly normal Dressing clean dry and intact  Discharge Medications:   Allergies as of 07/28/2022       Reactions   Apple Juice Itching   Reaction to red apple (only tolerates granny apples)   Citrus Itching, Swelling   Tongue swells   Other Other (See Comments)   Plums, pineapple, red apple Unnamed blood thinner- Caused    Peach [prunus Persica] Itching   Can eat canned peaches only   Pineapple Itching   Plum Pulp Itching   Strawberry Extract Itching        Medication List     STOP taking these medications    HYDROcodone-acetaminophen 10-325 MG tablet Commonly known as: NORCO   HYDROcodone-acetaminophen 5-325 MG tablet Commonly known as: Norco       TAKE these medications    albuterol 108 (90 Base) MCG/ACT inhaler Commonly known as: VENTOLIN HFA Inhale 2 puffs into the lungs every 6 (six) hours as needed for wheezing or shortness of breath.   ALPRAZolam 0.5 MG tablet Commonly known as: XANAX Take 1 mg by mouth 2 (two) times daily as needed (panic attacks).   amLODipine 10 MG tablet Commonly known as: NORVASC Take 1 tablet (10 mg total) by mouth every morning.   ARTIFICIAL TEARS OP Place 1 drop into both eyes 2 (two) times daily.   ascorbic acid 500 MG tablet Commonly known as: VITAMIN C Take 500 mg by mouth daily.   aspirin 325 MG tablet Take 325 mg by mouth daily.   CALCIUM PO Take 1 tablet by mouth daily.   cholecalciferol 25 MCG (1000 UT) tablet Generic drug: Cholecalciferol Take 1,000 Units by mouth daily.   CRANBERRY PO Take 1 tablet by mouth daily.   cyanocobalamin 1000 MCG tablet Commonly known as: VITAMIN B12 Take 1,000 mcg by mouth daily.   diclofenac Sodium 1 % Gel Commonly known as: VOLTAREN Apply 4 g topically 4 (four) times daily. What changed:  when  to take this reasons to take this   diphenhydrAMINE 25 MG tablet Commonly known as: BENADRYL Take 50 mg by mouth 2 (two) times daily as needed for allergies.   ferrous sulfate 325 (65 FE) MG tablet Take 325 mg by mouth daily with breakfast.   Fish Oil 1000 MG Caps  Take 1,000 mg by mouth daily.   hydrochlorothiazide 25 MG tablet Commonly known as: HYDRODIURIL Take 1 tablet (25 mg total) by mouth every morning.   lisinopril 40 MG tablet Commonly known as: ZESTRIL Take 1 tablet (40 mg total) by mouth every morning.   multivitamin with minerals Tabs tablet Take 1 tablet by mouth daily.   Oxycodone HCl 10 MG Tabs Take 1 tablet (10 mg total) by mouth every 4 (four) hours as needed for severe pain ((score 7 to 10)).   simvastatin 20 MG tablet Commonly known as: ZOCOR Take 20 mg by mouth daily.   Vitamin E 400 units Tabs Take 400 Units by mouth daily.               Durable Medical Equipment  (From admission, onward)           Start     Ordered   07/27/22 1507  DME Walker rolling  Once       Question:  Patient needs a walker to treat with the following condition  Answer:  Degenerative spondylolisthesis   07/27/22 1506   07/27/22 1507  DME 3 n 1  Once        07/27/22 1506            Disposition: Home   Final Dx: plif  Discharge Instructions      Remove dressing in 72 hours   Complete by: As directed    Call MD for:  difficulty breathing, headache or visual disturbances   Complete by: As directed    Call MD for:  persistant nausea and vomiting   Complete by: As directed    Call MD for:  redness, tenderness, or signs of infection (pain, swelling, redness, odor or green/yellow discharge around incision site)   Complete by: As directed    Call MD for:  severe uncontrolled pain   Complete by: As directed    Call MD for:  temperature >100.4   Complete by: As directed    Diet - low sodium heart healthy   Complete by: As directed    Increase activity  slowly   Complete by: As directed         Follow-up Information     Julio Sicks, MD. Schedule an appointment as soon as possible for a visit in 2 week(s).   Specialty: Neurosurgery Contact information: [address] [place] [address] [phone]                  Signed: Tia Alert 07/28/2022, 9:39 AM

## 2022-08-08 ENCOUNTER — Ambulatory Visit: Payer: Medicaid Other | Admitting: Physical Therapy

## 2022-08-08 MED FILL — Heparin Sodium (Porcine) Inj 1000 Unit/ML: INTRAMUSCULAR | Qty: 30 | Status: AC

## 2022-08-21 NOTE — Therapy (Deleted)
OUTPATIENT PHYSICAL THERAPY THORACOLUMBAR EVALUATION   Patient Name: Katrina Schmidt MRN: [id number] DOB:[birth year], 62 y.o., female Today's Date: 08/21/2022  END OF SESSION:   Past Medical History:  Diagnosis Date   Anemia    Anxiety    Asthma    Bipolar disorder (HCC)    Chronic bronchitis (HCC)    DDD (degenerative disc disease), lumbosacral    Depression    DJD (degenerative joint disease)    Dyspnea    GERD (gastroesophageal reflux disease)    Headache    Hyperlipidemia    Hypertension    Pneumonia    Sciatica    Stroke (HCC)    mini stroke 2021   Past Surgical History:  Procedure Laterality Date   HERNIA REPAIR     Patient Active Problem List   Diagnosis Date Noted   Degenerative spondylolisthesis 07/27/2022   Pain due to onychomycosis of toenails of both feet 08/11/2019   CVA (cerebral vascular accident) (HCC) 01/16/2019   Hypertension 01/16/2019   Hyperlipidemia 01/16/2019   Tobacco abuse 01/16/2019   Alcohol abuse 01/16/2019   Sciatica 01/16/2019   Panic attacks 01/16/2019    PCP: System, Provider Not In   REFERRING PROVIDER: Julio Sicks, MD   REFERRING DIAG: M54.9 (ICD-10-CM) - Back pain, 07/27/22 for PLIF L4-5   Rationale for Evaluation and Treatment: Rehabilitation  THERAPY DIAG:  No diagnosis found.  ONSET DATE: 07/27/22 for PLIF L4-5   SUBJECTIVE:                                                                                                                                                                                           SUBJECTIVE STATEMENT: ***  PERTINENT HISTORY:  Pt is a 62 y.o. female who presented 07/27/22 for PLIF L4-5. PMH: anemia, anxiety, asthma, bipolar disorder, DDD, DJD, depression, GERD, HLD, HTN, sciatica, CVA.  PAIN:  Are you having pain? {OPRCPAIN:27236}  PRECAUTIONS: Back  WEIGHT BEARING RESTRICTIONS: No  FALLS:  Has patient fallen in last 6 months? No  LIVING ENVIRONMENT: Lives with: {[facility] lives  with:25569::"lives with their family"} Lives in: {Lives in:25570} Stairs: {opstairs:27293} Has following equipment at home: {Assistive devices:23999}  OCCUPATION: not working  PLOF: Independent with basic ADLs and Independent with household mobility with device  PATIENT GOALS: ***  NEXT MD VISIT: ***  OBJECTIVE:   DIAGNOSTIC FINDINGS:  None available  PATIENT SURVEYS:  FOTO ***  SCREENING FOR RED FLAGS:  MUSCLE LENGTH: Hamstrings: Right *** deg; Left *** deg Thomas test: Right *** deg; Left *** deg  POSTURE: {posture:25561}  PALPATION: ***  LUMBAR ROM:   AROM eval  Flexion  Extension   Right lateral flexion   Left lateral flexion   Right rotation   Left rotation    (Blank rows = not tested)  LOWER EXTREMITY ROM:     {AROM/PROM:27142}  Right eval Left eval  Hip flexion    Hip extension    Hip abduction    Hip adduction    Hip internal rotation    Hip external rotation    Knee flexion    Knee extension    Ankle dorsiflexion    Ankle plantarflexion    Ankle inversion    Ankle eversion     (Blank rows = not tested)  LOWER EXTREMITY MMT:    MMT Right eval Left eval  Hip flexion    Hip extension    Hip abduction    Hip adduction    Hip internal rotation    Hip external rotation    Knee flexion    Knee extension    Ankle dorsiflexion    Ankle plantarflexion    Ankle inversion    Ankle eversion     (Blank rows = not tested)  LUMBAR SPECIAL TESTS:  {lumbar special test:25242}  FUNCTIONAL TESTS:  5 times sit to stand: ***  GAIT: Distance walked: *** Assistive device utilized: {Assistive devices:23999} Level of assistance: {Levels of assistance:24026} Comments: ***  TODAY'S TREATMENT:                                                                                                                              DATE: ***    PATIENT EDUCATION:  Education details: Discussed eval findings, rehab rationale and POC and patient is in  agreement  Person educated: Patient Education method: Explanation Education comprehension: verbalized understanding and needs further education  HOME EXERCISE PROGRAM: ***  ASSESSMENT:  CLINICAL IMPRESSION: Patient is a *** y.o. *** who was seen today for physical therapy evaluation and treatment for ***.   OBJECTIVE IMPAIRMENTS: {opptimpairments:25111}.   ACTIVITY LIMITATIONS: {activitylimitations:27494}  PARTICIPATION LIMITATIONS: {participationrestrictions:25113}  PERSONAL FACTORS: {Personal factors:25162} are also affecting patient's functional outcome.   REHAB POTENTIAL: Good  CLINICAL DECISION MAKING: Evolving/moderate complexity  EVALUATION COMPLEXITY: Low   GOALS: Goals reviewed with patient? No  SHORT TERM GOALS: Target date: ***  *** Baseline: Goal status: {GOALSTATUS:25110}  2.  *** Baseline:  Goal status: {GOALSTATUS:25110}  3.  *** Baseline:  Goal status: {GOALSTATUS:25110}  4.  *** Baseline:  Goal status: {GOALSTATUS:25110}  5.  *** Baseline:  Goal status: {GOALSTATUS:25110}  6.  *** Baseline:  Goal status: {GOALSTATUS:25110}  LONG TERM GOALS: Target date: ***  *** Baseline:  Goal status: {GOALSTATUS:25110}  2.  *** Baseline:  Goal status: {GOALSTATUS:25110}  3.  *** Baseline:  Goal status: {GOALSTATUS:25110}  4.  *** Baseline:  Goal status: {GOALSTATUS:25110}  5.  *** Baseline:  Goal status: {GOALSTATUS:25110}  6.  *** Baseline:  Goal status: {GOALSTATUS:25110}  PLAN:  PT FREQUENCY: 2x/week  PT DURATION: 4 weeks  PLANNED INTERVENTIONS:  Therapeutic exercises, Therapeutic activity, Neuromuscular re-education, Balance training, Gait training, Patient/Family education, Self Care, Joint mobilization, Stair training, Dry Needling, Manual therapy, and Re-evaluation.  PLAN FOR NEXT SESSION: HEP review and update, manual techniques as appropriate, aerobic tasks, ROM and flexibility activities, strengthening and PREs,  TPDN, gait and balance training as needed     Hildred Laser, PT 08/21/2022, 11:51 AM

## 2022-08-22 ENCOUNTER — Ambulatory Visit: Payer: Medicaid Other

## 2022-09-13 ENCOUNTER — Encounter: Payer: Self-pay | Admitting: Physical Therapy

## 2022-09-13 ENCOUNTER — Ambulatory Visit: Payer: Medicaid Other | Attending: Neurosurgery | Admitting: Physical Therapy

## 2022-09-13 ENCOUNTER — Other Ambulatory Visit: Payer: Self-pay

## 2022-09-13 DIAGNOSIS — M6281 Muscle weakness (generalized): Secondary | ICD-10-CM | POA: Insufficient documentation

## 2022-09-13 DIAGNOSIS — R2689 Other abnormalities of gait and mobility: Secondary | ICD-10-CM | POA: Diagnosis present

## 2022-09-13 DIAGNOSIS — M5459 Other low back pain: Secondary | ICD-10-CM | POA: Insufficient documentation

## 2022-09-13 NOTE — Patient Instructions (Signed)
Access Code: [email or web address] URL: [email or web address] Date: 09/13/2022 Prepared by: Rosana Hoes  Exercises - Seated Long Arc Quad  - 2 x daily - 20 reps - Sit to Stand  - 3 x daily - 5 reps - Clamshell  - 2 x daily - 20 reps

## 2022-09-13 NOTE — Therapy (Addendum)
OUTPATIENT PHYSICAL THERAPY EVALUATION  DISCHARGE   Patient Name: Katrina Schmidt MRN: [id number] DOB:1960-06-23, 62 y.o., female Today's Date: 09/14/2022   END OF SESSION:  PT End of Session - 09/13/22 1510     Visit Number 1    Number of Visits 17    Date for PT Re-Evaluation 11/08/22    Authorization Type [employer]    PT Start Time 1455    PT Stop Time 1530    PT Time Calculation (min) 35 min    Activity Tolerance Patient limited by pain    Behavior During Therapy [facility] for tasks assessed/performed             Past Medical History:  Diagnosis Date   Anemia    Anxiety    Asthma    Bipolar disorder (HCC)    Chronic bronchitis (HCC)    DDD (degenerative disc disease), lumbosacral    Depression    DJD (degenerative joint disease)    Dyspnea    GERD (gastroesophageal reflux disease)    Headache    Hyperlipidemia    Hypertension    Pneumonia    Sciatica    Stroke (HCC)    mini stroke 2021   Past Surgical History:  Procedure Laterality Date   HERNIA REPAIR     Patient Active Problem List   Diagnosis Date Noted   Degenerative spondylolisthesis 07/27/2022   Pain due to onychomycosis of toenails of both feet 08/11/2019   CVA (cerebral vascular accident) (HCC) 01/16/2019   Hypertension 01/16/2019   Hyperlipidemia 01/16/2019   Tobacco abuse 01/16/2019   Alcohol abuse 01/16/2019   Sciatica 01/16/2019   Panic attacks 01/16/2019    PCP: N/A  REFERRING PROVIDER: Julio Sicks, MD  REFERRING DIAG: Back pain  Rationale for Evaluation and Treatment: Rehabilitation  THERAPY DIAG:  Other low back pain  Muscle weakness (generalized)  Other abnormalities of gait and mobility  ONSET DATE: 07/27/2022   SUBJECTIVE:                                                                                                                                                                                          SUBJECTIVE STATEMENT: Patient had surgery on her back  on 07/27/2022 which did help her pain, but she continues to have severe low back pain. She continues to have pain mainly on the right side of her lower back. She has been walking around the house, and just started walking from her house to the street using a rollator. She does still use a hoveround chair when she goes out, and in the house when the pain is really  bad. She reports that any bending will cause pain, and she does try to do what she can but doing things around the house will cause pain. She states she sometimes has trouble getting out of the bed because of the pain. Pain is worse in the evening.  PERTINENT HISTORY:  PLIF L4-5 on 07/27/2022; see PMH above  PAIN:  Are you having pain? Yes:  NPRS scale: 7/10 (10/10 at worse) Pain location: Lower back, R > L Pain description: sharp, "just pain that gets worse" Aggravating factors: Bending, walking, sitting too long, doing things around the house Relieving factors: Medication  PRECAUTIONS: None  WEIGHT BEARING RESTRICTIONS: No  FALLS:  Has patient fallen in last 6 months? No  LIVING ENVIRONMENT: Lives with: lives with their family Lives in: House/apartment Stairs: No Has following equipment at home: Environmental consultant - 4 wheeled and Wheelchair (power)  PLOF: Independent with basic ADLs, Independent with household mobility with device, and Independent with community mobility with device  PATIENT GOALS: Improve pain and walking ability  NEXT MD VISIT: 09/20/2022   OBJECTIVE:  PATIENT SURVEYS:  FOTO 31 % functional status  SCREENING FOR RED FLAGS:  Negative  COGNITION: Overall cognitive status: Within functional limits for tasks assessed     SENSATION: Patient report bilateral foot and lower leg numbness  MUSCLE LENGTH: Not assessed  POSTURE:   Decreased lumbar lordosis, rounded shoulders, difficulty attaining upright posture  PALPATION: Global tenderness of lumbar paraspinals to light palpation  LUMBAR ROM:    Not  assessed at eval  AROM eval  Flexion   Extension   Right lateral flexion   Left lateral flexion   Right rotation   Left rotation    (Blank rows = not tested)  LOWER EXTREMITY ROM:      LE ROM grossly WFL  LOWER EXTREMITY MMT:    MMT Right eval Left eval  Hip flexion 3 3  Hip extension 2 2  Hip abduction 2 2  Hip adduction    Hip internal rotation    Hip external rotation    Knee flexion 4 4  Knee extension 4 4  Ankle dorsiflexion    Ankle plantarflexion    Ankle inversion    Ankle eversion     (Blank rows = not tested)  FUNCTIONAL TESTS:  30 seconds chair stand test: 2 reps 2 minute walk test: 110 ft  GAIT: Distance walked: 110 ft Assistive device utilized: Walker - 4 wheeled Level of assistance: Modified independence Comments: Forward trunk lean with slide right side bend, decreased gait speed, crouched knee position    TODAY'S TREATMENT:         [facility] Adult PT Treatment:                                                DATE: 09/13/2022 Therapeutic Exercise: LAQ x 10 each Sit to stand x 5 Sidelying clamshell x 10 each  PATIENT EDUCATION:  Education details: Exam findings, POC, HEP Person educated: Patient Education method: Explanation, Demonstration, Tactile cues, Verbal cues, and Handouts Education comprehension: verbalized understanding, returned demonstration, verbal cues required, tactile cues required, and needs further education  HOME EXERCISE PROGRAM: Access Code: [email or web address]    ASSESSMENT: CLINICAL IMPRESSION: Patient is a 62 y.o. female who was seen today for physical therapy evaluation and treatment for low back pain s/p PLIF L4-5 on 07/27/2022.  Evaluation limited secondary to pain, and patient did not tolerate supine positioning well. She demonstrate significant limitations in her strength and poor tolerance for sitting, standing, and walking that are impacting her functional ability and community access.   OBJECTIVE IMPAIRMENTS: Abnormal gait,  decreased activity tolerance, decreased balance, difficulty walking, decreased ROM, decreased strength, impaired flexibility, postural dysfunction, and pain.   ACTIVITY LIMITATIONS: carrying, lifting, bending, sitting, standing, squatting, sleeping, bed mobility, dressing, hygiene/grooming, and locomotion level  PARTICIPATION LIMITATIONS: meal prep, cleaning, laundry, driving, shopping, and community activity  PERSONAL FACTORS: Fitness, Past/current experiences, Social background, Time since onset of injury/illness/exacerbation, Transportation, and 3+ comorbidities: see PMH above  are also affecting patient's functional outcome.   REHAB POTENTIAL: Fair  CLINICAL DECISION MAKING: Stable/uncomplicated  EVALUATION COMPLEXITY: Low   GOALS: Goals reviewed with patient? Yes  SHORT TERM GOALS: Target date: 10/11/2022  Patient will be I with initial HEP in order to progress with therapy. Baseline: HEP provided at eval Goal status: INITIAL  2.  Patient will report low back pain level </= 5/10 in order to reduce functional limitations Baseline: patient reports low back pain 7-10/10 Goal status: INITIAL  3.  Patient will demonstrate 30 second stand test >/= 5 reps in order to indicate improved strength and mobility Baseline: 2 reps Goal status: INITIAL  LONG TERM GOALS: Target date: 11/08/2022  Patient will be I with final HEP to maintain progress from PT. Baseline: HEP provided at eval Goal status: INITIAL  2.  Patient will report >/= 40% status on FOTO to indicate improved functional ability. Baseline: 31% functional status Goal status: INITIAL  3.  Patient will demonstrate 2 min walk test >/= 300 ft using rollator in order to improve community access and mobility Baseline: 110 ft using rollator Goal status: INITIAL  4.  Patient will demonstrate gross hip strength >/= 4-/5 MMT and knee strength >/= 5/5 MMT in order to improve standing and walking tolerance for household  tasks Baseline: hip strength grossly 2/5 MMT, knee strength grossly 4/5 MMT Goal status: INITIAL   PLAN: PT FREQUENCY: 1-2x/week (patient reports ability to come in 1x/weekfor now due to transportation)  PT DURATION: 8 weeks  PLANNED INTERVENTIONS: Therapeutic exercises, Therapeutic activity, Neuromuscular re-education, Balance training, Gait training, Patient/Family education, Self Care, Joint mobilization, Aquatic Therapy, Dry Needling, Cryotherapy, Moist heat, Manual therapy, and Re-evaluation.  PLAN FOR NEXT SESSION: Review HEP and progress PRN, gentle lumbar and LE stretching as tolerated; progress core, hip, and LE strengthening as able; gait and balance training   Rosana Hoes, PT, DPT, LAT, ATC 09/14/22  8:54 AM Phone: [phone] Fax: [phone]   Check all possible CPT codes: 62952 - PT Re-evaluation, 97110- Therapeutic Exercise, [phone]- Neuro Re-education, [phone] - Gait Training, [phone] - Manual Therapy, 97530 - Therapeutic Activities, [phone] - Self Care, and [phone] - Aquatic therapy    Check all conditions that are expected to impact treatment: {Conditions expected to impact treatment:Musculoskeletal disorders and Social determinants of health   If treatment provided at initial evaluation, no treatment charged due to lack of authorization.      PHYSICAL THERAPY DISCHARGE SUMMARY  Visits from Start of Care: 1  Current functional level related to goals / functional outcomes: See above   Remaining deficits: See above   Education / Equipment: HEP   Patient agrees to discharge. Patient goals were not met. Patient is being discharged due to not returning since the last visit.  Rosana Hoes, PT, DPT, LAT, ATC 10/25/22  11:53 AM Phone: [phone]  Fax: [phone]

## 2022-10-01 ENCOUNTER — Ambulatory Visit: Payer: Medicaid Other | Attending: Neurosurgery | Admitting: Physical Therapy

## 2022-10-02 ENCOUNTER — Telehealth: Payer: Self-pay | Admitting: Physical Therapy

## 2022-10-02 NOTE — Telephone Encounter (Signed)
Contacted patient due to missed PT appointment on 10/01/2022. Patient stated she was out of town and had the wrong date for the appointment. She was reminded of her next scheduled appointment and informed of the attendance policy. Patient expressed understanding.   Rosana Hoes, PT, DPT, LAT, ATC 10/02/22  12:32 PM Phone: [phone] Fax: [phone]

## 2022-10-08 ENCOUNTER — Ambulatory Visit: Payer: Medicaid Other | Admitting: Physical Therapy

## 2022-10-10 ENCOUNTER — Telehealth: Payer: Self-pay | Admitting: Physical Therapy

## 2022-10-10 ENCOUNTER — Ambulatory Visit: Payer: Medicaid Other | Admitting: Physical Therapy

## 2022-10-10 NOTE — Telephone Encounter (Signed)
Spoke with patient regarding missed PT appointment, she stated she thought her appointment was on a different day. Due to this being her 3rd missed appointment, all remaining appointments were canceled and patient was informed she would need a new PT referral to return to therapy. Patient was advised to contact her referring provider to have them send a new referral and then our office would contact her to schedule a re-evaluation. Patient expressed understanding.   Rosana Hoes, PT, DPT, LAT, ATC 10/10/22  3:00 PM Phone: [phone] Fax: [phone]

## 2022-10-15 ENCOUNTER — Encounter: Payer: Medicaid Other | Admitting: Physical Therapy

## 2022-10-17 ENCOUNTER — Ambulatory Visit: Payer: Medicaid Other | Admitting: Physical Therapy

## 2022-10-22 ENCOUNTER — Encounter: Payer: Medicaid Other | Admitting: Physical Therapy

## 2022-10-24 ENCOUNTER — Ambulatory Visit: Payer: Medicaid Other | Admitting: Physical Therapy

## 2024-01-22 ENCOUNTER — Other Ambulatory Visit: Payer: Self-pay | Admitting: Neurosurgery

## 2024-01-22 DIAGNOSIS — M5416 Radiculopathy, lumbar region: Secondary | ICD-10-CM

## 2024-02-04 ENCOUNTER — Other Ambulatory Visit

## 2024-02-14 ENCOUNTER — Other Ambulatory Visit

## 2024-03-06 ENCOUNTER — Ambulatory Visit
Admission: RE | Admit: 2024-03-06 | Discharge: 2024-03-06 | Disposition: A | Discharge status: Home / Self Care | Source: Ambulatory Visit | Attending: Neurosurgery | Admitting: Neurosurgery

## 2024-03-06 DIAGNOSIS — M5416 Radiculopathy, lumbar region: Secondary | ICD-10-CM
